# Patient Record
Sex: Female | Born: 1945
Health system: Southern US, Community
[De-identification: ages and names within clinical notes are randomized; demographics above are authoritative.]

## PROBLEM LIST (undated history)

## (undated) DIAGNOSIS — K589 Irritable bowel syndrome without diarrhea: Secondary | ICD-10-CM

## (undated) DIAGNOSIS — N63 Unspecified lump in unspecified breast: Secondary | ICD-10-CM

## (undated) DIAGNOSIS — C801 Malignant (primary) neoplasm, unspecified: Secondary | ICD-10-CM

## (undated) DIAGNOSIS — C7931 Secondary malignant neoplasm of brain: Secondary | ICD-10-CM

## (undated) DIAGNOSIS — IMO0001 Reserved for inherently not codable concepts without codable children: Secondary | ICD-10-CM

## (undated) DIAGNOSIS — IMO0002 Reserved for concepts with insufficient information to code with codable children: Secondary | ICD-10-CM

## (undated) DIAGNOSIS — M810 Age-related osteoporosis without current pathological fracture: Secondary | ICD-10-CM

## (undated) DIAGNOSIS — L0291 Cutaneous abscess, unspecified: Secondary | ICD-10-CM

## (undated) HISTORY — DX: Cutaneous abscess, unspecified: L02.91

## (undated) HISTORY — PX: BREAST LUMPECTOMY: SHX2

## (undated) HISTORY — DX: Irritable bowel syndrome, unspecified: K58.9

## (undated) HISTORY — DX: Unspecified lump in unspecified breast: N63.0

## (undated) HISTORY — DX: Malignant (primary) neoplasm, unspecified: C80.1

---

## 2010-02-17 ENCOUNTER — Encounter: Admission: RE | Admit: 2010-02-17 | Discharge: 2010-02-17 | Payer: Self-pay | Admitting: Internal Medicine

## 2010-02-18 ENCOUNTER — Ambulatory Visit: Payer: Self-pay | Admitting: Oncology

## 2010-02-25 LAB — COMPREHENSIVE METABOLIC PANEL
ALT: 20 U/L (ref 0–35)
AST: 19 U/L (ref 0–37)
Albumin: 4.3 g/dL (ref 3.5–5.2)
Alkaline Phosphatase: 77 U/L (ref 39–117)
BUN: 12 mg/dL (ref 6–23)
CO2: 25 mEq/L (ref 19–32)
Calcium: 9.3 mg/dL (ref 8.4–10.5)
Chloride: 101 mEq/L (ref 96–112)
Creatinine, Ser: 0.63 mg/dL (ref 0.40–1.20)
Glucose, Bld: 152 mg/dL — ABNORMAL HIGH (ref 70–99)
Potassium: 3.9 mEq/L (ref 3.5–5.3)
Sodium: 138 mEq/L (ref 135–145)
Total Bilirubin: 0.4 mg/dL (ref 0.3–1.2)
Total Protein: 7.6 g/dL (ref 6.0–8.3)

## 2010-02-25 LAB — CBC WITH DIFFERENTIAL/PLATELET
BASO%: 0.7 % (ref 0.0–2.0)
Basophils Absolute: 0 10*3/uL (ref 0.0–0.1)
EOS%: 4 % (ref 0.0–7.0)
Eosinophils Absolute: 0.1 10*3/uL (ref 0.0–0.5)
HCT: 38.1 % (ref 34.8–46.6)
HGB: 13 g/dL (ref 11.6–15.9)
LYMPH%: 48.5 % (ref 14.0–49.7)
MCH: 27.1 pg (ref 25.1–34.0)
MCHC: 34 g/dL (ref 31.5–36.0)
MCV: 79.7 fL (ref 79.5–101.0)
MONO#: 0.3 10*3/uL (ref 0.1–0.9)
MONO%: 8.2 % (ref 0.0–14.0)
NEUT#: 1.4 10*3/uL — ABNORMAL LOW (ref 1.5–6.5)
NEUT%: 38.6 % (ref 38.4–76.8)
Platelets: 283 10*3/uL (ref 145–400)
RBC: 4.78 10*6/uL (ref 3.70–5.45)
RDW: 12.7 % (ref 11.2–14.5)
WBC: 3.8 10*3/uL — ABNORMAL LOW (ref 3.9–10.3)
lymph#: 1.8 10*3/uL (ref 0.9–3.3)

## 2010-02-25 LAB — CANCER ANTIGEN 27.29: CA 27.29: 21 U/mL (ref 0–39)

## 2010-03-03 ENCOUNTER — Encounter: Admission: RE | Admit: 2010-03-03 | Discharge: 2010-03-03 | Payer: Self-pay | Admitting: Oncology

## 2010-03-05 ENCOUNTER — Ambulatory Visit (HOSPITAL_COMMUNITY): Admission: RE | Admit: 2010-03-05 | Discharge: 2010-03-05 | Payer: Self-pay | Admitting: Internal Medicine

## 2010-03-27 ENCOUNTER — Ambulatory Visit: Payer: Self-pay | Admitting: Oncology

## 2010-03-30 LAB — COMPREHENSIVE METABOLIC PANEL
ALT: 15 U/L (ref 0–35)
AST: 15 U/L (ref 0–37)
Albumin: 4.3 g/dL (ref 3.5–5.2)
Alkaline Phosphatase: 71 U/L (ref 39–117)
BUN: 12 mg/dL (ref 6–23)
CO2: 26 mEq/L (ref 19–32)
Calcium: 9.3 mg/dL (ref 8.4–10.5)
Chloride: 102 mEq/L (ref 96–112)
Creatinine, Ser: 0.68 mg/dL (ref 0.40–1.20)
Glucose, Bld: 110 mg/dL — ABNORMAL HIGH (ref 70–99)
Potassium: 4.4 mEq/L (ref 3.5–5.3)
Sodium: 138 mEq/L (ref 135–145)
Total Bilirubin: 0.4 mg/dL (ref 0.3–1.2)
Total Protein: 7.4 g/dL (ref 6.0–8.3)

## 2010-03-30 LAB — CBC WITH DIFFERENTIAL/PLATELET
BASO%: 0.6 % (ref 0.0–2.0)
Basophils Absolute: 0 10*3/uL (ref 0.0–0.1)
EOS%: 4 % (ref 0.0–7.0)
Eosinophils Absolute: 0.2 10*3/uL (ref 0.0–0.5)
HCT: 36.3 % (ref 34.8–46.6)
HGB: 12.6 g/dL (ref 11.6–15.9)
LYMPH%: 47.6 % (ref 14.0–49.7)
MCH: 27.2 pg (ref 25.1–34.0)
MCHC: 34.6 g/dL (ref 31.5–36.0)
MCV: 78.5 fL — ABNORMAL LOW (ref 79.5–101.0)
MONO#: 0.4 10*3/uL (ref 0.1–0.9)
MONO%: 9.1 % (ref 0.0–14.0)
NEUT#: 1.6 10*3/uL (ref 1.5–6.5)
NEUT%: 38.7 % (ref 38.4–76.8)
Platelets: 276 10*3/uL (ref 145–400)
RBC: 4.62 10*6/uL (ref 3.70–5.45)
RDW: 13.3 % (ref 11.2–14.5)
WBC: 4.2 10*3/uL (ref 3.9–10.3)
lymph#: 2 10*3/uL (ref 0.9–3.3)

## 2010-05-19 ENCOUNTER — Ambulatory Visit: Payer: Self-pay | Admitting: Oncology

## 2010-05-21 LAB — COMPREHENSIVE METABOLIC PANEL
ALT: 21 U/L (ref 0–35)
AST: 24 U/L (ref 0–37)
Albumin: 4 g/dL (ref 3.5–5.2)
Alkaline Phosphatase: 67 U/L (ref 39–117)
BUN: 11 mg/dL (ref 6–23)
CO2: 26 mEq/L (ref 19–32)
Calcium: 9.2 mg/dL (ref 8.4–10.5)
Chloride: 106 mEq/L (ref 96–112)
Creatinine, Ser: 0.73 mg/dL (ref 0.40–1.20)
Glucose, Bld: 114 mg/dL — ABNORMAL HIGH (ref 70–99)
Potassium: 4.1 mEq/L (ref 3.5–5.3)
Sodium: 140 mEq/L (ref 135–145)
Total Bilirubin: 0.6 mg/dL (ref 0.3–1.2)
Total Protein: 7.5 g/dL (ref 6.0–8.3)

## 2010-05-21 LAB — CBC WITH DIFFERENTIAL/PLATELET
BASO%: 0.9 % (ref 0.0–2.0)
Basophils Absolute: 0 10*3/uL (ref 0.0–0.1)
EOS%: 1.9 % (ref 0.0–7.0)
Eosinophils Absolute: 0.1 10*3/uL (ref 0.0–0.5)
HCT: 36.3 % (ref 34.8–46.6)
HGB: 12.5 g/dL (ref 11.6–15.9)
LYMPH%: 40.4 % (ref 14.0–49.7)
MCH: 27.3 pg (ref 25.1–34.0)
MCHC: 34.5 g/dL (ref 31.5–36.0)
MCV: 79.1 fL — ABNORMAL LOW (ref 79.5–101.0)
MONO#: 0.3 10*3/uL (ref 0.1–0.9)
MONO%: 9.7 % (ref 0.0–14.0)
NEUT#: 1.5 10*3/uL (ref 1.5–6.5)
NEUT%: 47.1 % (ref 38.4–76.8)
Platelets: 289 10*3/uL (ref 145–400)
RBC: 4.59 10*6/uL (ref 3.70–5.45)
RDW: 13.4 % (ref 11.2–14.5)
WBC: 3.1 10*3/uL — ABNORMAL LOW (ref 3.9–10.3)
lymph#: 1.3 10*3/uL (ref 0.9–3.3)

## 2010-05-21 LAB — VITAMIN D 25 HYDROXY (VIT D DEFICIENCY, FRACTURES): Vit D, 25-Hydroxy: 27 ng/mL — ABNORMAL LOW (ref 30–89)

## 2010-05-21 LAB — CANCER ANTIGEN 27.29: CA 27.29: 17 U/mL (ref 0–39)

## 2010-05-21 LAB — LACTATE DEHYDROGENASE: LDH: 135 U/L (ref 94–250)

## 2010-06-01 ENCOUNTER — Ambulatory Visit (HOSPITAL_COMMUNITY)
Admission: RE | Admit: 2010-06-01 | Discharge: 2010-06-01 | Payer: Self-pay | Source: Home / Self Care | Admitting: Oncology

## 2010-07-09 ENCOUNTER — Ambulatory Visit: Payer: Self-pay | Admitting: Oncology

## 2010-09-09 ENCOUNTER — Ambulatory Visit: Payer: Self-pay | Admitting: Oncology

## 2010-09-11 LAB — CBC WITH DIFFERENTIAL/PLATELET
BASO%: 0.7 % (ref 0.0–2.0)
Basophils Absolute: 0 10*3/uL (ref 0.0–0.1)
EOS%: 1.1 % (ref 0.0–7.0)
Eosinophils Absolute: 0 10*3/uL (ref 0.0–0.5)
HCT: 37.3 % (ref 34.8–46.6)
HGB: 12.6 g/dL (ref 11.6–15.9)
LYMPH%: 40.7 % (ref 14.0–49.7)
MCH: 27 pg (ref 25.1–34.0)
MCHC: 33.8 g/dL (ref 31.5–36.0)
MCV: 80.1 fL (ref 79.5–101.0)
MONO#: 0.3 10*3/uL (ref 0.1–0.9)
MONO%: 8.8 % (ref 0.0–14.0)
NEUT#: 1.7 10*3/uL (ref 1.5–6.5)
NEUT%: 48.7 % (ref 38.4–76.8)
Platelets: 305 10*3/uL (ref 145–400)
RBC: 4.66 10*6/uL (ref 3.70–5.45)
RDW: 13.2 % (ref 11.2–14.5)
WBC: 3.5 10*3/uL — ABNORMAL LOW (ref 3.9–10.3)
lymph#: 1.4 10*3/uL (ref 0.9–3.3)

## 2010-09-12 LAB — COMPREHENSIVE METABOLIC PANEL
ALT: 12 U/L (ref 0–35)
AST: 13 U/L (ref 0–37)
Albumin: 4.4 g/dL (ref 3.5–5.2)
Alkaline Phosphatase: 63 U/L (ref 39–117)
BUN: 12 mg/dL (ref 6–23)
CO2: 26 mEq/L (ref 19–32)
Calcium: 9.6 mg/dL (ref 8.4–10.5)
Chloride: 102 mEq/L (ref 96–112)
Creatinine, Ser: 0.73 mg/dL (ref 0.40–1.20)
Glucose, Bld: 132 mg/dL — ABNORMAL HIGH (ref 70–99)
Potassium: 3.9 mEq/L (ref 3.5–5.3)
Sodium: 139 mEq/L (ref 135–145)
Total Bilirubin: 0.3 mg/dL (ref 0.3–1.2)
Total Protein: 7.5 g/dL (ref 6.0–8.3)

## 2010-09-12 LAB — VITAMIN D 25 HYDROXY (VIT D DEFICIENCY, FRACTURES): Vit D, 25-Hydroxy: 77 ng/mL (ref 30–89)

## 2010-09-12 LAB — CANCER ANTIGEN 27.29: CA 27.29: 20 U/mL (ref 0–39)

## 2010-09-22 ENCOUNTER — Encounter: Admission: RE | Admit: 2010-09-22 | Discharge: 2010-09-22 | Payer: Self-pay | Admitting: Oncology

## 2010-11-09 LAB — COMPREHENSIVE METABOLIC PANEL
ALT: 17 U/L (ref 0–35)
AST: 18 U/L (ref 0–37)
Albumin: 4 g/dL (ref 3.5–5.2)
Alkaline Phosphatase: 64 U/L (ref 39–117)
BUN: 11 mg/dL (ref 6–23)
CO2: 28 mEq/L (ref 19–32)
Calcium: 9.5 mg/dL (ref 8.4–10.5)
Chloride: 105 mEq/L (ref 96–112)
Creatinine, Ser: 0.68 mg/dL (ref 0.4–1.2)
GFR calc Af Amer: >60 mL/min (ref >60)
GFR calc non Af Amer: >60 mL/min (ref >60)
Glucose, Bld: 103 mg/dL — ABNORMAL HIGH (ref 70–99)
Potassium: 4.2 mEq/L (ref 3.5–5.1)
Sodium: 141 mEq/L (ref 135–145)
Total Bilirubin: 0.6 mg/dL (ref 0.3–1.2)
Total Protein: 6.7 g/dL (ref 6.0–8.3)

## 2010-11-09 LAB — DIFFERENTIAL
Basophils Absolute: 0 10*3/uL (ref 0.0–0.1)
Basophils Relative: 1 % (ref 0–1)
Eosinophils Absolute: 0.1 10*3/uL (ref 0.0–0.7)
Eosinophils Relative: 2 % (ref 0–5)
Lymphocytes Relative: 47 % — ABNORMAL HIGH (ref 12–46)
Lymphs Abs: 1.9 10*3/uL (ref 0.7–4.0)
Monocytes Absolute: 0.3 10*3/uL (ref 0.1–1.0)
Monocytes Relative: 7 % (ref 3–12)
Neutro Abs: 1.8 10*3/uL (ref 1.7–7.7)
Neutrophils Relative %: 44 % (ref 43–77)

## 2010-11-09 LAB — CBC
HCT: 36.3 % (ref 36.0–46.0)
Hemoglobin: 12 g/dL (ref 12.0–15.0)
MCH: 26.3 pg (ref 26.0–34.0)
MCHC: 33.1 g/dL (ref 30.0–36.0)
MCV: 79.6 fL (ref 78.0–100.0)
Platelets: 287 10*3/uL (ref 150–400)
RBC: 4.56 MIL/uL (ref 3.87–5.11)
RDW: 13.1 % (ref 11.5–15.5)
WBC: 4.1 10*3/uL (ref 4.0–10.5)

## 2010-11-09 LAB — SURGICAL PCR SCREEN
MRSA, PCR: NEGATIVE
Staphylococcus aureus: NEGATIVE

## 2010-11-10 ENCOUNTER — Ambulatory Visit (HOSPITAL_COMMUNITY)
Admission: RE | Admit: 2010-11-10 | Discharge: 2010-11-10 | Payer: Self-pay | Source: Home / Self Care | Attending: General Surgery | Admitting: General Surgery

## 2010-11-27 NOTE — Op Note (Signed)
Michele Hale, Michele Hale           ACCOUNT NO.:  [id number]  MEDICAL RECORD NO.:  [id number]          PATIENT TYPE:  AMB  LOCATION:  SDS                          FACILITY:  [facility]  PHYSICIAN:  Ollen Gross. Vernell Morgans, M.D. DATE OF BIRTH:  [birth year]  DATE OF PROCEDURE:  11/10/2010 DATE OF DISCHARGE:  11/10/2010                              OPERATIVE REPORT   PREOPERATIVE DIAGNOSIS:  Left breast cancer.  POSTOPERATIVE DIAGNOSIS:  Left breast cancer.  PROCEDURE:  Left breast lumpectomy and sentinel node biopsy x4 with injection of blue dye.  SURGEON:  Ollen Gross. Vernell Morgans, MD  ANESTHESIA:  General via LMA.  PROCEDURE:  After informed consent was obtained, the patient was brought to the operating room, placed in a supine position on the operating room table.  After an adequate induction of general anesthesia, the patient's left breast, chest, and axillary areas were all prepped with ChloraPrep, allowed to dry, and then draped in usual sterile manner.  Earlier in the day, the patient got an injection of 1 mCi technetium sulfur colloid in the subareolar position.  At this point, 2 mL of methylene blue and 3 mL of injectable saline were also injected in the subareolar position and the breast was massaged for several minutes.  NeoProbe was used to identify hot spot in the left axilla.  A small transverse incision was made overlying the hot spot with a 15-blade knife.  This incision was carried down through the skin and subcutaneous tissue sharply with electrocautery until the axilla was entered.  Once into the axilla, blunt dissection was used onto the direction of the NeoProbe until we were able to identify actually 4 different blue lymph nodes, 3 of which were hot, the highest scale was around 2000, each of these was excised by sharp dissection with electrocautery.  Then the lymphatics to each lymph node were clamped with hemostats, divided and ligated with 3-0 Vicryl ties, I believe the  third sentinel node actually was blue, but not hot and probably had 3 small lymph nodes.  These were all sent to Pathology for further evaluation.  The area was examined and found to be hemostatic.  The deep layer of the wound was then closed with interrupted 3-0 Vicryl stitches.  The skin was then closed with a running 4-0 Monocryl subcuticular stitch.  Attention was then turned to the left breast.  The mass itself was tethering the skin at about the 5 o'clock position.  An elliptical radially-oriented incision was made overlying the tented skin to incorporate the skin.  This incision was carried down through the skin and subcutaneous tissue sharply with the electrocautery.  Once into the breast tissue, the mass could be palpated.  A circular portion of breast tissue was excised sharply around the mass until it was completely removed.  It was then oriented according to the assigned colors on the paint kit and then sent to Pathology for further evaluation.  Hemostasis was achieved using the Bovie electrocautery.  The breast tissue was then dissected away from the skin and the subcutaneous blade.  This allowed the breast tissue to cover the cavity.  The breast tissue  was sutured to the inferior edge of the cavity with interrupted 3-0 Vicryl stitches.  We then closed the skin overlying the cavity with interrupted 4-0 Monocryl subcuticular stitches.  Dermabond dressings were applied, the axillary wound was also infiltrated with 0.25% Marcaine.  The patient tolerated the procedure well.  At the end of the case, all needle, sponge, and instrument counts were correct.  The patient was then awakened and taken to the recovery room in a stable condition.     Ollen Gross. Vernell Morgans, M.D.     PST/MEDQ  D:  11/10/2010  T:  11/11/2010  Job:  [id number]  Electronically Signed by Chevis Pretty III M.D. on 11/27/2010 08:53:04 AM

## 2010-12-10 ENCOUNTER — Other Ambulatory Visit: Payer: Self-pay | Admitting: Oncology

## 2010-12-10 ENCOUNTER — Encounter (HOSPITAL_BASED_OUTPATIENT_CLINIC_OR_DEPARTMENT_OTHER): Payer: Self-pay | Admitting: Oncology

## 2010-12-10 DIAGNOSIS — Z170 Estrogen receptor positive status [ER+]: Secondary | ICD-10-CM

## 2010-12-10 DIAGNOSIS — C50519 Malignant neoplasm of lower-outer quadrant of unspecified female breast: Secondary | ICD-10-CM

## 2010-12-10 LAB — CBC WITH DIFFERENTIAL/PLATELET
BASO%: 1 % (ref 0.0–2.0)
Basophils Absolute: 0 10*3/uL (ref 0.0–0.1)
EOS%: 1.6 % (ref 0.0–7.0)
Eosinophils Absolute: 0.1 10*3/uL (ref 0.0–0.5)
HCT: 36.2 % (ref 34.8–46.6)
HGB: 12.5 g/dL (ref 11.6–15.9)
LYMPH%: 33.8 % (ref 14.0–49.7)
MCH: 27.4 pg (ref 25.1–34.0)
MCHC: 34.4 g/dL (ref 31.5–36.0)
MCV: 79.8 fL (ref 79.5–101.0)
MONO#: 0.3 10*3/uL (ref 0.1–0.9)
MONO%: 7.1 % (ref 0.0–14.0)
NEUT#: 2.1 10*3/uL (ref 1.5–6.5)
NEUT%: 56.5 % (ref 38.4–76.8)
Platelets: 271 10*3/uL (ref 145–400)
RBC: 4.54 10*6/uL (ref 3.70–5.45)
RDW: 13.1 % (ref 11.2–14.5)
WBC: 3.7 10*3/uL — ABNORMAL LOW (ref 3.9–10.3)
lymph#: 1.3 10*3/uL (ref 0.9–3.3)

## 2010-12-10 LAB — COMPREHENSIVE METABOLIC PANEL
ALT: 16 U/L (ref 0–35)
AST: 25 U/L (ref 0–37)
Albumin: 3.9 g/dL (ref 3.5–5.2)
Alkaline Phosphatase: 71 U/L (ref 39–117)
BUN: 11 mg/dL (ref 6–23)
CO2: 27 mEq/L (ref 19–32)
Calcium: 9.7 mg/dL (ref 8.4–10.5)
Chloride: 104 mEq/L (ref 96–112)
Creatinine, Ser: 0.87 mg/dL (ref 0.40–1.20)
Glucose, Bld: 144 mg/dL — ABNORMAL HIGH (ref 70–99)
Potassium: 3.9 mEq/L (ref 3.5–5.3)
Sodium: 140 mEq/L (ref 135–145)
Total Bilirubin: 0.2 mg/dL — ABNORMAL LOW (ref 0.3–1.2)
Total Protein: 7.6 g/dL (ref 6.0–8.3)

## 2010-12-11 LAB — VITAMIN D 25 HYDROXY (VIT D DEFICIENCY, FRACTURES): Vit D, 25-Hydroxy: 100 ng/mL — ABNORMAL HIGH (ref 30–89)

## 2010-12-17 ENCOUNTER — Encounter (HOSPITAL_BASED_OUTPATIENT_CLINIC_OR_DEPARTMENT_OTHER): Payer: Self-pay | Admitting: Oncology

## 2010-12-17 DIAGNOSIS — C50519 Malignant neoplasm of lower-outer quadrant of unspecified female breast: Secondary | ICD-10-CM

## 2010-12-17 DIAGNOSIS — Z170 Estrogen receptor positive status [ER+]: Secondary | ICD-10-CM

## 2011-01-01 ENCOUNTER — Ambulatory Visit: Payer: Self-pay | Attending: Radiation Oncology | Admitting: Radiation Oncology

## 2011-01-01 DIAGNOSIS — C50519 Malignant neoplasm of lower-outer quadrant of unspecified female breast: Secondary | ICD-10-CM | POA: Insufficient documentation

## 2011-01-01 DIAGNOSIS — R209 Unspecified disturbances of skin sensation: Secondary | ICD-10-CM | POA: Insufficient documentation

## 2011-01-01 DIAGNOSIS — Z510 Encounter for antineoplastic radiation therapy: Secondary | ICD-10-CM | POA: Insufficient documentation

## 2011-01-01 DIAGNOSIS — M25519 Pain in unspecified shoulder: Secondary | ICD-10-CM | POA: Insufficient documentation

## 2011-02-23 ENCOUNTER — Other Ambulatory Visit: Payer: Self-pay | Admitting: Oncology

## 2011-02-23 ENCOUNTER — Encounter (HOSPITAL_BASED_OUTPATIENT_CLINIC_OR_DEPARTMENT_OTHER): Payer: Self-pay | Admitting: Oncology

## 2011-02-23 DIAGNOSIS — Z853 Personal history of malignant neoplasm of breast: Secondary | ICD-10-CM

## 2011-02-23 DIAGNOSIS — C50519 Malignant neoplasm of lower-outer quadrant of unspecified female breast: Secondary | ICD-10-CM

## 2011-02-23 DIAGNOSIS — Z170 Estrogen receptor positive status [ER+]: Secondary | ICD-10-CM

## 2011-03-18 ENCOUNTER — Ambulatory Visit
Admission: RE | Admit: 2011-03-18 | Discharge: 2011-03-18 | Disposition: A | Discharge status: Home / Self Care | Payer: Self-pay | Source: Ambulatory Visit | Attending: Radiation Oncology | Admitting: Radiation Oncology

## 2011-05-18 ENCOUNTER — Ambulatory Visit
Admission: RE | Admit: 2011-05-18 | Discharge: 2011-05-18 | Disposition: A | Discharge status: Home / Self Care | Payer: Self-pay | Source: Ambulatory Visit | Attending: Oncology | Admitting: Oncology

## 2011-05-18 DIAGNOSIS — Z853 Personal history of malignant neoplasm of breast: Secondary | ICD-10-CM

## 2011-05-20 ENCOUNTER — Other Ambulatory Visit: Payer: Self-pay | Admitting: Oncology

## 2011-05-20 ENCOUNTER — Encounter (HOSPITAL_BASED_OUTPATIENT_CLINIC_OR_DEPARTMENT_OTHER): Payer: Self-pay | Admitting: Oncology

## 2011-05-20 DIAGNOSIS — Z170 Estrogen receptor positive status [ER+]: Secondary | ICD-10-CM

## 2011-05-20 DIAGNOSIS — C50519 Malignant neoplasm of lower-outer quadrant of unspecified female breast: Secondary | ICD-10-CM

## 2011-05-20 LAB — CBC WITH DIFFERENTIAL/PLATELET
BASO%: 0.7 % (ref 0.0–2.0)
Eosinophils Absolute: 0.1 10*3/uL (ref 0.0–0.5)
HCT: 34.3 % — ABNORMAL LOW (ref 34.8–46.6)
MCHC: 34.4 g/dL (ref 31.5–36.0)
MONO#: 0.3 10*3/uL (ref 0.1–0.9)
NEUT#: 1.5 10*3/uL (ref 1.5–6.5)
RBC: 4.24 10*6/uL (ref 3.70–5.45)
WBC: 2.6 10*3/uL — ABNORMAL LOW (ref 3.9–10.3)
lymph#: 0.8 10*3/uL — ABNORMAL LOW (ref 0.9–3.3)

## 2011-05-21 LAB — COMPREHENSIVE METABOLIC PANEL
AST: 15 U/L (ref 0–37)
Alkaline Phosphatase: 63 U/L (ref 39–117)
BUN: 13 mg/dL (ref 6–23)
Calcium: 9.4 mg/dL (ref 8.4–10.5)
Chloride: 103 mEq/L (ref 96–112)
Creatinine, Ser: 0.75 mg/dL (ref 0.50–1.10)

## 2011-05-21 LAB — CANCER ANTIGEN 27.29: CA 27.29: 17 U/mL (ref 0–39)

## 2011-05-27 ENCOUNTER — Encounter (HOSPITAL_BASED_OUTPATIENT_CLINIC_OR_DEPARTMENT_OTHER): Payer: Self-pay | Admitting: Oncology

## 2011-05-27 ENCOUNTER — Other Ambulatory Visit: Payer: Self-pay | Admitting: Oncology

## 2011-05-27 DIAGNOSIS — C50519 Malignant neoplasm of lower-outer quadrant of unspecified female breast: Secondary | ICD-10-CM

## 2011-05-27 DIAGNOSIS — R2230 Localized swelling, mass and lump, unspecified upper limb: Secondary | ICD-10-CM

## 2011-05-27 DIAGNOSIS — Z170 Estrogen receptor positive status [ER+]: Secondary | ICD-10-CM

## 2011-06-09 ENCOUNTER — Ambulatory Visit
Admission: RE | Admit: 2011-06-09 | Discharge: 2011-06-09 | Disposition: A | Discharge status: Home / Self Care | Payer: Self-pay | Source: Ambulatory Visit | Attending: Oncology | Admitting: Oncology

## 2011-06-09 DIAGNOSIS — R2230 Localized swelling, mass and lump, unspecified upper limb: Secondary | ICD-10-CM

## 2011-06-10 ENCOUNTER — Ambulatory Visit
Admission: RE | Admit: 2011-06-10 | Discharge: 2011-06-10 | Disposition: A | Discharge status: Home / Self Care | Payer: Self-pay | Source: Ambulatory Visit | Attending: Oncology | Admitting: Oncology

## 2011-06-10 ENCOUNTER — Other Ambulatory Visit: Payer: Self-pay | Admitting: Oncology

## 2011-06-10 DIAGNOSIS — M79622 Pain in left upper arm: Secondary | ICD-10-CM

## 2011-11-27 ENCOUNTER — Telehealth: Payer: Self-pay | Admitting: Oncology

## 2011-11-27 NOTE — Telephone Encounter (Signed)
lmonvm for pt re appts for march. Schedule mailed.

## 2011-12-29 ENCOUNTER — Telehealth: Payer: Self-pay | Admitting: Oncology

## 2011-12-29 NOTE — Telephone Encounter (Signed)
Pt's son called to r/s he pt's march appts. gve him the next avail date

## 2011-12-30 ENCOUNTER — Other Ambulatory Visit: Payer: Self-pay | Admitting: Lab

## 2012-01-06 ENCOUNTER — Ambulatory Visit: Payer: Self-pay | Admitting: Oncology

## 2012-01-10 ENCOUNTER — Other Ambulatory Visit: Payer: Self-pay | Admitting: *Deleted

## 2012-01-10 DIAGNOSIS — Z170 Estrogen receptor positive status [ER+]: Secondary | ICD-10-CM

## 2012-01-10 DIAGNOSIS — R110 Nausea: Secondary | ICD-10-CM

## 2012-01-10 DIAGNOSIS — C50519 Malignant neoplasm of lower-outer quadrant of unspecified female breast: Secondary | ICD-10-CM

## 2012-01-10 MED ORDER — LETROZOLE 2.5 MG PO TABS: 2.5000 mg | ORAL_TABLET | Freq: Every day | ORAL | Status: DC

## 2012-01-10 MED ORDER — PROMETHAZINE HCL 25 MG PO TABS: 25.0000 mg | ORAL_TABLET | Freq: Four times a day (QID) | ORAL | Status: DC | PRN

## 2012-01-10 NOTE — Telephone Encounter (Signed)
Son called asking for an earlier appt. Date.  Asked why and he says she needs refills on femara and phenergan.  Takes phenergan before meals.  No need to change appts.  Refills done at this time.

## 2012-01-11 ENCOUNTER — Encounter: Payer: Self-pay | Admitting: Oncology

## 2012-01-11 ENCOUNTER — Other Ambulatory Visit: Payer: Self-pay | Admitting: Nurse Practitioner

## 2012-01-11 DIAGNOSIS — R110 Nausea: Secondary | ICD-10-CM

## 2012-01-11 DIAGNOSIS — Z170 Estrogen receptor positive status [ER+]: Secondary | ICD-10-CM

## 2012-01-11 DIAGNOSIS — C50519 Malignant neoplasm of lower-outer quadrant of unspecified female breast: Secondary | ICD-10-CM

## 2012-01-11 MED ORDER — LETROZOLE 2.5 MG PO TABS: 2.5000 mg | ORAL_TABLET | Freq: Every day | ORAL | Status: AC

## 2012-01-11 NOTE — Progress Notes (Signed)
Patient received two prescription from [facility] pharmacy on 01/10/12 $40.83,her remaninig balance [facility] $76.02 and ALIGHT $325.32.

## 2012-01-18 ENCOUNTER — Telehealth: Payer: Self-pay | Admitting: *Deleted

## 2012-01-18 NOTE — Telephone Encounter (Signed)
left message to inform the patient of the new date and time of the new appointment 02-07-2012 starting at 1:00pm

## 2012-01-19 ENCOUNTER — Telehealth: Payer: Self-pay | Admitting: *Deleted

## 2012-01-19 NOTE — Telephone Encounter (Signed)
na

## 2012-01-24 ENCOUNTER — Encounter (INDEPENDENT_AMBULATORY_CARE_PROVIDER_SITE_OTHER): Payer: Self-pay | Admitting: General Surgery

## 2012-01-24 ENCOUNTER — Ambulatory Visit (INDEPENDENT_AMBULATORY_CARE_PROVIDER_SITE_OTHER): Payer: Self-pay | Admitting: General Surgery

## 2012-01-24 ENCOUNTER — Other Ambulatory Visit: Payer: Self-pay | Admitting: *Deleted

## 2012-01-24 VITALS — BP 153/87 | HR 93 | Temp 97.9°F | Ht 62.0 in | Wt 154.2 lb

## 2012-01-24 DIAGNOSIS — C50919 Malignant neoplasm of unspecified site of unspecified female breast: Secondary | ICD-10-CM | POA: Insufficient documentation

## 2012-01-24 MED ORDER — ALENDRONATE SODIUM 70 MG PO TABS: 70.0000 mg | ORAL_TABLET | ORAL | Status: DC

## 2012-01-24 NOTE — Patient Instructions (Signed)
Continue Femara Continue regular self exams

## 2012-01-25 ENCOUNTER — Encounter: Payer: Self-pay | Admitting: Oncology

## 2012-01-25 NOTE — Progress Notes (Signed)
Patient received one prescription from [facility] pharmacy on 01/24/12 $26.09,her remaninig balance [facility] $49.93 and ALIGHT $268.42.

## 2012-02-03 ENCOUNTER — Other Ambulatory Visit: Payer: Self-pay | Admitting: Lab

## 2012-02-07 ENCOUNTER — Other Ambulatory Visit (HOSPITAL_BASED_OUTPATIENT_CLINIC_OR_DEPARTMENT_OTHER): Payer: Self-pay | Admitting: Lab

## 2012-02-07 ENCOUNTER — Telehealth: Payer: Self-pay | Admitting: Oncology

## 2012-02-07 ENCOUNTER — Ambulatory Visit (HOSPITAL_BASED_OUTPATIENT_CLINIC_OR_DEPARTMENT_OTHER): Payer: Self-pay | Admitting: Oncology

## 2012-02-07 VITALS — BP 121/76 | HR 96 | Temp 98.8°F | Ht 62.0 in | Wt 151.1 lb

## 2012-02-07 DIAGNOSIS — C50519 Malignant neoplasm of lower-outer quadrant of unspecified female breast: Secondary | ICD-10-CM

## 2012-02-07 DIAGNOSIS — Z170 Estrogen receptor positive status [ER+]: Secondary | ICD-10-CM

## 2012-02-07 DIAGNOSIS — M810 Age-related osteoporosis without current pathological fracture: Secondary | ICD-10-CM

## 2012-02-07 DIAGNOSIS — C50919 Malignant neoplasm of unspecified site of unspecified female breast: Secondary | ICD-10-CM

## 2012-02-07 LAB — CBC WITH DIFFERENTIAL/PLATELET
BASO%: 1.7 % (ref 0.0–2.0)
Basophils Absolute: 0.1 10*3/uL (ref 0.0–0.1)
EOS%: 11.2 % — ABNORMAL HIGH (ref 0.0–7.0)
HCT: 39.2 % (ref 34.8–46.6)
HGB: 13.1 g/dL (ref 11.6–15.9)
LYMPH%: 35.8 % (ref 14.0–49.7)
MCH: 27.1 pg (ref 25.1–34.0)
MCHC: 33.4 g/dL (ref 31.5–36.0)
MONO#: 0.3 10*3/uL (ref 0.1–0.9)
NEUT%: 42.8 % (ref 38.4–76.8)
Platelets: 269 10*3/uL (ref 145–400)

## 2012-02-07 NOTE — Progress Notes (Signed)
Hematology and Oncology Follow Up Visit  Michele Hale [id number] 1946-01-18 66 y.o. 02/07/2012 2:43 PM PCP  Principle Diagnosis: 66 yo su[place] woman with hx of locally advanced er/pr+ breast cancer on femara since jan 2011, s/o lumpectomy and sentinel node 1/12, s/p xrt 4/12, n ongoing femara Hx of osteoperosis on fosamax  Interim History:  There have been no intercurrent illness, hospitalizations or medication changes. She has been in [place] and is now back, no intercurrent problems  Medications: I have reviewed the patient's current medications.  Allergies: No Known Allergies  Past Medical History, Surgical history, Social history, and Family History were reviewed and updated.  Review of Systems: Constitutional:  Negative for fever, chills, night sweats, anorexia, weight loss, pain. Cardiovascular: no chest pain or dyspnea on exertion Respiratory: no cough, shortness of breath, or wheezing Neurological: negative Dermatological: negative ENT: negative Skin Gastrointestinal: negative Genito-Urinary: negative Hematological and Lymphatic: negative Breast: negative Musculoskeletal: negative Remaining ROS negative.  Physical Exam: Blood pressure 121/76, pulse 96, temperature 98.8 F (37.1 C), temperature source Oral, height 5\' 2"  (1.575 m), weight 151 lb 1.6 oz (68.539 kg). ECOG: 0 General appearance: alert, cooperative and appears stated age Head: Normocephalic, without obvious abnormality, atraumatic Neck: no adenopathy, no carotid bruit, no JVD, supple, symmetrical, trachea midline and thyroid not enlarged, symmetric, no tenderness/mass/nodules Lymph nodes: Cervical, supraclavicular, and axillary nodes normal. Cardiac : regular rate and rhythm, no murmurs or gallops Pulmonary:clear to auscultation bilaterally and normal percussion bilaterally Breasts: inspection negative, no nipple discharge or bleeding, no masses or nodularity palpable Abdomen:soft, non-tender; bowel  sounds normal; no masses,  no organomegaly Extremities negative Neuro: alert, oriented, normal speech, no focal findings or movement disorder noted  Lab Results: Lab Results  Component Value Date   WBC 3.6* 02/07/2012   HGB 13.1 02/07/2012   HCT 39.2 02/07/2012   MCV 81.2 02/07/2012   PLT 269 02/07/2012     Chemistry      Component Value Date/Time   NA 140 05/20/2011 1357   NA 140 05/20/2011 1357   NA 140 05/20/2011 1357   K 3.9 05/20/2011 1357   K 3.9 05/20/2011 1357   K 3.9 05/20/2011 1357   CL 103 05/20/2011 1357   CL 103 05/20/2011 1357   CL 103 05/20/2011 1357   CO2 24 05/20/2011 1357   CO2 24 05/20/2011 1357   CO2 24 05/20/2011 1357   BUN 13 05/20/2011 1357   BUN 13 05/20/2011 1357   BUN 13 05/20/2011 1357   CREATININE 0.75 05/20/2011 1357   CREATININE 0.75 05/20/2011 1357   CREATININE 0.75 05/20/2011 1357      Component Value Date/Time   CALCIUM 9.4 05/20/2011 1357   CALCIUM 9.4 05/20/2011 1357   CALCIUM 9.4 05/20/2011 1357   ALKPHOS 63 05/20/2011 1357   ALKPHOS 63 05/20/2011 1357   ALKPHOS 63 05/20/2011 1357   AST 15 05/20/2011 1357   AST 15 05/20/2011 1357   AST 15 05/20/2011 1357   ALT 11 05/20/2011 1357   ALT 11 05/20/2011 1357   ALT 11 05/20/2011 1357   BILITOT 0.5 05/20/2011 1357   BILITOT 0.5 05/20/2011 1357   BILITOT 0.5 05/20/2011 1357      .pathology. Radiological Studies: chest X-ray n/a Mammogram Due 7/13 Bone density Due 5/13  Impression and Plan: Patient is doing well, we will see he rin 6 months. If no improvement in bone density, need to consider prolia.  More than 50% of the visit was spent in patient-related counselling  Pierce Crane, MD 4/15/20132:43 PM

## 2012-02-07 NOTE — Telephone Encounter (Signed)
gve the pt's son the oct 2013 appt calendar along with the mammo/bone density appt

## 2012-02-08 ENCOUNTER — Ambulatory Visit: Payer: Self-pay | Admitting: Oncology

## 2012-02-08 LAB — COMPREHENSIVE METABOLIC PANEL
AST: 17 U/L (ref 0–37)
Albumin: 4.3 g/dL (ref 3.5–5.2)
BUN: 13 mg/dL (ref 6–23)
Calcium: 9.8 mg/dL (ref 8.4–10.5)
Chloride: 103 mEq/L (ref 96–112)
Creatinine, Ser: 0.73 mg/dL (ref 0.50–1.10)
Glucose, Bld: 117 mg/dL — ABNORMAL HIGH (ref 70–99)
Potassium: 4.2 mEq/L (ref 3.5–5.3)

## 2012-02-10 ENCOUNTER — Ambulatory Visit: Payer: Self-pay | Admitting: Oncology

## 2012-02-14 NOTE — Progress Notes (Signed)
Subjective:     Patient ID: Michele Hale, female   DOB: 1946/06/07, 66 y.o.   MRN: [id number]  HPI The patient is a 66 year old black female who is about a year and 3 months out from a left breast lumpectomy and negative sentinel node biopsy for a T1 C. N0 left breast cancer. She was ER-positive, weekly PR positive, and HER-2 negative. She finished radiation therapy and is now taking Femara. She seems to be tolerating this well. Since her last visit she return to [place] for several months but is back now. She denies any breast pain.  Review of Systems  Constitutional: Negative.   HENT: Negative.   Eyes: Negative.   Respiratory: Negative.   Cardiovascular: Negative.   Gastrointestinal: Negative.   Genitourinary: Negative.   Musculoskeletal: Negative.   Skin: Negative.   Neurological: Negative.   Hematological: Negative.   Psychiatric/Behavioral: Negative.        Objective:   Physical Exam  Constitutional: She is oriented to person, place, and time. She appears well-developed and well-nourished.  HENT:  Head: Normocephalic and atraumatic.  Eyes: Conjunctivae and EOM are normal. Pupils are equal, round, and reactive to light.  Neck: Normal range of motion. Neck supple.  Cardiovascular: Normal rate, regular rhythm and normal heart sounds.   Pulmonary/Chest: Effort normal and breath sounds normal.       She has some palpable fullness beneath her old incision but otherwise no palpable mass in either breast. No axillary supraclavicular or cervical lymphadenopathy. She did have some fullness in the left axilla that was biopsied and came back as fat necrosis.  Abdominal: Soft. Bowel sounds are normal. She exhibits no mass. There is no tenderness.  Musculoskeletal: Normal range of motion.  Neurological: She is alert and oriented to person, place, and time.  Skin: Skin is warm and dry.  Psychiatric: She has a normal mood and affect. Her behavior is normal.       Assessment:     One  year and 3 months status post left breast lumpectomy and negative sentinel node biopsy    Plan:     At this point she will continue to take Femara. She will continue to do regular self exams. We will plan to see her back in about 6 months.

## 2012-02-16 ENCOUNTER — Encounter: Payer: Self-pay | Admitting: Oncology

## 2012-02-16 NOTE — Progress Notes (Signed)
Patient received three prescriptions from [facility] pharmacy on 02/14/12 $66.93,her remaninig balance [facility] $350.07 and ALIGHT $201.49.

## 2012-03-15 ENCOUNTER — Encounter: Payer: Self-pay | Admitting: Oncology

## 2012-03-15 ENCOUNTER — Other Ambulatory Visit: Payer: Self-pay | Admitting: *Deleted

## 2012-03-15 DIAGNOSIS — C50919 Malignant neoplasm of unspecified site of unspecified female breast: Secondary | ICD-10-CM

## 2012-03-15 MED ORDER — PROMETHAZINE HCL 25 MG PO TABS: 25.0000 mg | ORAL_TABLET | Freq: Four times a day (QID) | ORAL | Status: DC | PRN

## 2012-03-15 NOTE — Progress Notes (Signed)
Patient received two prescriptions from [facility] pharmacy on 03/13/12 $54.54,her remaninig balance [facility]-0- and ALIGHT $196.88.

## 2012-03-22 ENCOUNTER — Encounter: Payer: Self-pay | Admitting: Oncology

## 2012-03-22 NOTE — Progress Notes (Signed)
Patient received one prescription from [facility] pharmacy on 03/15/12 $12.39,her remaninig balance [facility]-0- and ALIGHT $184.49.

## 2012-04-13 ENCOUNTER — Other Ambulatory Visit: Payer: Self-pay | Admitting: *Deleted

## 2012-04-13 DIAGNOSIS — C50919 Malignant neoplasm of unspecified site of unspecified female breast: Secondary | ICD-10-CM

## 2012-04-13 MED ORDER — PROMETHAZINE HCL 25 MG PO TABS: 25.0000 mg | ORAL_TABLET | Freq: Four times a day (QID) | ORAL | Status: DC | PRN

## 2012-04-13 NOTE — Telephone Encounter (Signed)
Called patient as instructed by provider to clarify need for phenergan refill.  Spoke with patient's son Michele Hale who reports "she has stomach problems and unable to eat without the promethazine.  She doesn't vomit every time she eats.  She needs this or she won't eat."  Reports this being a problem for twenty years.  Denies PCP.  Encouraged to obtain PCP to have this evaluated.  Verbal order received and read back from Dr. Donnie Coffin to proceed with refill.

## 2012-04-24 ENCOUNTER — Ambulatory Visit (INDEPENDENT_AMBULATORY_CARE_PROVIDER_SITE_OTHER): Payer: Self-pay | Admitting: General Surgery

## 2012-05-11 ENCOUNTER — Ambulatory Visit (INDEPENDENT_AMBULATORY_CARE_PROVIDER_SITE_OTHER): Payer: Self-pay | Admitting: General Surgery

## 2012-05-11 ENCOUNTER — Encounter (INDEPENDENT_AMBULATORY_CARE_PROVIDER_SITE_OTHER): Payer: Self-pay | Admitting: General Surgery

## 2012-05-11 ENCOUNTER — Other Ambulatory Visit: Payer: Self-pay | Admitting: *Deleted

## 2012-05-11 VITALS — BP 136/79 | HR 75 | Temp 98.1°F | Ht 62.0 in | Wt 145.0 lb

## 2012-05-11 DIAGNOSIS — C50919 Malignant neoplasm of unspecified site of unspecified female breast: Secondary | ICD-10-CM

## 2012-05-11 DIAGNOSIS — C50519 Malignant neoplasm of lower-outer quadrant of unspecified female breast: Secondary | ICD-10-CM

## 2012-05-11 MED ORDER — LETROZOLE 2.5 MG PO TABS: 2.5000 mg | ORAL_TABLET | Freq: Every day | ORAL | Status: AC

## 2012-05-11 NOTE — Patient Instructions (Signed)
Continue regular self exams Continue femara 

## 2012-05-11 NOTE — Progress Notes (Signed)
Subjective:     Patient ID: Michele Hale, female   DOB: 1946-03-27, 66 y.o.   MRN: [id number]  HPI The patient is a 66 year old black female who is one year in 6 months out from a left breast lobectomy and negative sentinel node biopsy for a T1 C. N0 left breast cancer. She has some occasional soreness of the left breast but this is stable. She continues to take Femara and is tolerating it well. She has had no problems since her last visit. She has no complaints today.  Review of Systems  Constitutional: Negative.   HENT: Negative.   Eyes: Negative.   Respiratory: Negative.   Cardiovascular: Negative.   Gastrointestinal: Negative.   Genitourinary: Negative.   Musculoskeletal: Negative.   Skin: Negative.   Neurological: Negative.   Hematological: Negative.   Psychiatric/Behavioral: Negative.        Objective:   Physical Exam  Constitutional: She is oriented to person, place, and time. She appears well-developed and well-nourished.  HENT:  Head: Normocephalic and atraumatic.  Eyes: Conjunctivae and EOM are normal. Pupils are equal, round, and reactive to light.  Neck: Normal range of motion. Neck supple.  Cardiovascular: Normal rate, regular rhythm and normal heart sounds.   Pulmonary/Chest: Effort normal and breath sounds normal.       She has some palpable fullness beneath the scar in her left breast secondary to radiation. This is stable. No other palpable mass in either breast. No palpable axillary supraclavicular cervical lymphadenopathy.  Abdominal: Soft. Bowel sounds are normal. She exhibits no mass. There is no tenderness.  Musculoskeletal: Normal range of motion.  Lymphadenopathy:    She has no cervical adenopathy.  Neurological: She is alert and oriented to person, place, and time.  Skin: Skin is warm and dry.  Psychiatric: She has a normal mood and affect. Her behavior is normal.       Assessment:     One year in 6 months status post left breast lobectomy and  negative sentinel node biopsy    Plan:     At this point she will continue to do regular self exams. She will continue to take Femara. We will plan to see her back in 3 months. She is scheduled for her next mammogram at the end of this month

## 2012-05-22 ENCOUNTER — Ambulatory Visit
Admission: RE | Admit: 2012-05-22 | Discharge: 2012-05-22 | Disposition: A | Discharge status: Home / Self Care | Payer: Self-pay | Source: Ambulatory Visit | Attending: Oncology | Admitting: Oncology

## 2012-05-22 DIAGNOSIS — C50919 Malignant neoplasm of unspecified site of unspecified female breast: Secondary | ICD-10-CM

## 2012-08-07 ENCOUNTER — Other Ambulatory Visit: Payer: Self-pay | Admitting: *Deleted

## 2012-08-07 DIAGNOSIS — C50919 Malignant neoplasm of unspecified site of unspecified female breast: Secondary | ICD-10-CM

## 2012-08-08 ENCOUNTER — Ambulatory Visit (HOSPITAL_BASED_OUTPATIENT_CLINIC_OR_DEPARTMENT_OTHER): Payer: Self-pay | Admitting: Oncology

## 2012-08-08 ENCOUNTER — Other Ambulatory Visit (HOSPITAL_BASED_OUTPATIENT_CLINIC_OR_DEPARTMENT_OTHER): Payer: Self-pay

## 2012-08-08 ENCOUNTER — Telehealth: Payer: Self-pay | Admitting: *Deleted

## 2012-08-08 VITALS — BP 144/74 | HR 71 | Temp 98.3°F | Resp 20 | Ht 62.0 in | Wt 142.0 lb

## 2012-08-08 DIAGNOSIS — C50919 Malignant neoplasm of unspecified site of unspecified female breast: Secondary | ICD-10-CM

## 2012-08-08 DIAGNOSIS — Z170 Estrogen receptor positive status [ER+]: Secondary | ICD-10-CM

## 2012-08-08 DIAGNOSIS — M810 Age-related osteoporosis without current pathological fracture: Secondary | ICD-10-CM

## 2012-08-08 DIAGNOSIS — C50519 Malignant neoplasm of lower-outer quadrant of unspecified female breast: Secondary | ICD-10-CM

## 2012-08-08 LAB — COMPREHENSIVE METABOLIC PANEL (CC13)
Albumin: 3.8 g/dL (ref 3.5–5.0)
Alkaline Phosphatase: 67 U/L (ref 40–150)
BUN: 13 mg/dL (ref 7.0–26.0)
CO2: 24 mEq/L (ref 22–29)
Calcium: 9.4 mg/dL (ref 8.4–10.4)
Chloride: 109 mEq/L — ABNORMAL HIGH (ref 98–107)
Glucose: 108 mg/dl — ABNORMAL HIGH (ref 70–99)
Potassium: 3.7 mEq/L (ref 3.5–5.1)
Sodium: 142 mEq/L (ref 136–145)
Total Protein: 7 g/dL (ref 6.4–8.3)

## 2012-08-08 LAB — CBC WITH DIFFERENTIAL/PLATELET
Basophils Absolute: 0 10*3/uL (ref 0.0–0.1)
Eosinophils Absolute: 0.1 10*3/uL (ref 0.0–0.5)
HGB: 11.9 g/dL (ref 11.6–15.9)
MCV: 81.4 fL (ref 79.5–101.0)
MONO#: 0.3 10*3/uL (ref 0.1–0.9)
MONO%: 10.1 % (ref 0.0–14.0)
NEUT#: 1.4 10*3/uL — ABNORMAL LOW (ref 1.5–6.5)
RBC: 4.3 10*6/uL (ref 3.70–5.45)
RDW: 13.4 % (ref 11.2–14.5)
WBC: 2.9 10*3/uL — ABNORMAL LOW (ref 3.9–10.3)
lymph#: 1.1 10*3/uL (ref 0.9–3.3)

## 2012-08-08 NOTE — Progress Notes (Signed)
Hematology and Oncology Follow Up Visit  Michele Hale [id number] [birth year] 66 y.o. 08/08/2012 4:28 PM PCP  Principle Diagnosis: 66 yo su[place]ese woman with hx of locally advanced er/pr+ breast cancer on femara since jan 2011, s/o lumpectomy and sentinel node 1/12, s/p xrt 4/12, n ongoing femara Hx of osteoperosis on fosamax  Interim History:  There have been no intercurrent illness, hospitalizations or medication changes. She has been in [place] and is now back, no intercurrent problems  Medications: I have reviewed the patient's current medications.  Allergies: No Known Allergies  Past Medical History, Surgical history, Social history, and Family History were reviewed and updated.  Review of Systems: Constitutional:  Negative for fever, chills, night sweats, anorexia, weight loss, pain. Cardiovascular: no chest pain or dyspnea on exertion Respiratory: no cough, shortness of breath, or wheezing Neurological: negative Dermatological: negative ENT: negative Skin Gastrointestinal: negative Genito-Urinary: negative Hematological and Lymphatic: negative Breast: negative Musculoskeletal: negative Remaining ROS negative.  Physical Exam: Blood pressure 144/74, pulse 71, temperature 98.3 F (36.8 C), resp. rate 20, height 5\' 2"  (1.575 m), weight 142 lb (64.411 kg). ECOG: 0 General appearance: alert, cooperative and appears stated age Head: Normocephalic, without obvious abnormality, atraumatic Neck: no adenopathy, no carotid bruit, no JVD, supple, symmetrical, trachea midline and thyroid not enlarged, symmetric, no tenderness/mass/nodules Lymph nodes: Cervical, supraclavicular, and axillary nodes normal. Cardiac : regular rate and rhythm, no murmurs or gallops Pulmonary:clear to auscultation bilaterally and normal percussion bilaterally Breasts: inspection negative, no nipple discharge or bleeding, no masses or nodularity palpable Abdomen:soft, non-tender; bowel sounds normal; no  masses,  no organomegaly Extremities negative Neuro: alert, oriented, normal speech, no focal findings or movement disorder noted  Lab Results: Lab Results  Component Value Date   WBC 2.9* 08/08/2012   HGB 11.9 08/08/2012   HCT 35.0 08/08/2012   MCV 81.4 08/08/2012   PLT 253 08/08/2012     Chemistry      Component Value Date/Time   NA 142 08/08/2012 1516   NA 139 02/07/2012 1312   K 3.7 08/08/2012 1516   K 4.2 02/07/2012 1312   CL 109* 08/08/2012 1516   CL 103 02/07/2012 1312   CO2 24 08/08/2012 1516   CO2 26 02/07/2012 1312   BUN 13.0 08/08/2012 1516   BUN 13 02/07/2012 1312   CREATININE 0.8 08/08/2012 1516   CREATININE 0.73 02/07/2012 1312      Component Value Date/Time   CALCIUM 9.4 08/08/2012 1516   CALCIUM 9.8 02/07/2012 1312   ALKPHOS 67 08/08/2012 1516   ALKPHOS 63 02/07/2012 1312   AST 16 08/08/2012 1516   AST 17 02/07/2012 1312   ALT 14 08/08/2012 1516   ALT 15 02/07/2012 1312   BILITOT 0.30 08/08/2012 1516   BILITOT 0.3 02/07/2012 1312      .pathology. Radiological Studies: chest X-ray n/a Mammogram Due 7/13 Bone density Due 5/13  Impression and Plan: Patient is doing well, we will see he rin 6 months. If no improvement in bone density, need to consider prolia.  More than 50% of the visit was spent in patient-related counselling   Pierce Crane, MD 10/15/20134:28 PM

## 2012-08-08 NOTE — Progress Notes (Signed)
Hematology and Oncology Follow Up Visit  Michele Hale [id number] Apr 14, 1946 66 y.o. 08/08/2012 4:20 PM PCP  Principle Diagnosis: 66 yo su[place] woman with hx of locally advanced er/pr+ breast cancer on femara since jan 2011, s/o lumpectomy and sentinel node 1/12, s/p xrt 4/12, n ongoing femara Hx of osteoperosis on fosamax  Interim History:  There have been no intercurrent illness, hospitalizations or medication changes. She has been in [place] and is now back, no intercurrent problems  Medications: I have reviewed the patient's current medications.  Allergies: No Known Allergies  Past Medical History, Surgical history, Social history, and Family History were reviewed and updated.  Review of Systems: Constitutional:  Negative for fever, chills, night sweats, anorexia, weight loss, pain. Cardiovascular: no chest pain or dyspnea on exertion Respiratory: no cough, shortness of breath, or wheezing Neurological: negative Dermatological: negative ENT: negative Skin Gastrointestinal: negative Genito-Urinary: negative Hematological and Lymphatic: negative Breast: negative Musculoskeletal: negative Remaining ROS negative.  Physical Exam: Blood pressure 144/74, pulse 71, temperature 98.3 F (36.8 C), resp. rate 20, height 5\' 2"  (1.575 m), weight 142 lb (64.411 kg). ECOG: 0 General appearance: alert, cooperative and appears stated age Head: Normocephalic, without obvious abnormality, atraumatic Neck: no adenopathy, no carotid bruit, no JVD, supple, symmetrical, trachea midline and thyroid not enlarged, symmetric, no tenderness/mass/nodules Lymph nodes: Cervical, supraclavicular, and axillary nodes normal. Cardiac : regular rate and rhythm, no murmurs or gallops Pulmonary:clear to auscultation bilaterally and normal percussion bilaterally Breasts: inspection negative, no nipple discharge or bleeding, no masses or nodularity palpable Abdomen:soft, non-tender; bowel sounds normal; no  masses,  no organomegaly Extremities negative Neuro: alert, oriented, normal speech, no focal findings or movement disorder noted  Lab Results: Lab Results  Component Value Date   WBC 2.9* 08/08/2012   HGB 11.9 08/08/2012   HCT 35.0 08/08/2012   MCV 81.4 08/08/2012   PLT 253 08/08/2012     Chemistry      Component Value Date/Time   NA 142 08/08/2012 1516   NA 139 02/07/2012 1312   K 3.7 08/08/2012 1516   K 4.2 02/07/2012 1312   CL 109* 08/08/2012 1516   CL 103 02/07/2012 1312   CO2 24 08/08/2012 1516   CO2 26 02/07/2012 1312   BUN 13.0 08/08/2012 1516   BUN 13 02/07/2012 1312   CREATININE 0.8 08/08/2012 1516   CREATININE 0.73 02/07/2012 1312      Component Value Date/Time   CALCIUM 9.4 08/08/2012 1516   CALCIUM 9.8 02/07/2012 1312   ALKPHOS 67 08/08/2012 1516   ALKPHOS 63 02/07/2012 1312   AST 16 08/08/2012 1516   AST 17 02/07/2012 1312   ALT 14 08/08/2012 1516   ALT 15 02/07/2012 1312   BILITOT 0.30 08/08/2012 1516   BILITOT 0.3 02/07/2012 1312      .pathology. Radiological Studies: chest X-ray n/a Mammogram Due 7/13 Bone density 7/13- osteoperosis  Impression and Plan: Patient is doing well, we will see he rin 6 months. If no improvement in bone density, need to consider prolia.I will get precertification for this.  More than 50% of the visit was spent in patient-related counselling   Pierce Crane, MD 10/15/20134:20 PM

## 2012-08-08 NOTE — Telephone Encounter (Signed)
02-07-2013 STARTING AT 3:30PM

## 2012-08-09 LAB — CANCER ANTIGEN 27.29: CA 27.29: 13 U/mL (ref 0–39)

## 2012-08-09 LAB — VITAMIN D 25 HYDROXY (VIT D DEFICIENCY, FRACTURES): Vit D, 25-Hydroxy: 44 ng/mL (ref 30–89)

## 2012-09-07 ENCOUNTER — Other Ambulatory Visit: Payer: Self-pay | Admitting: Oncology

## 2012-09-07 ENCOUNTER — Other Ambulatory Visit: Payer: Self-pay | Admitting: *Deleted

## 2012-09-07 DIAGNOSIS — C50919 Malignant neoplasm of unspecified site of unspecified female breast: Secondary | ICD-10-CM

## 2012-09-07 MED ORDER — ALENDRONATE SODIUM 70 MG PO TABS: 70.0000 mg | ORAL_TABLET | ORAL | Status: DC

## 2012-09-07 MED ORDER — PROMETHAZINE HCL 25 MG PO TABS: 25.0000 mg | ORAL_TABLET | Freq: Four times a day (QID) | ORAL | Status: DC | PRN

## 2012-09-08 ENCOUNTER — Other Ambulatory Visit: Payer: Self-pay | Admitting: *Deleted

## 2012-09-08 ENCOUNTER — Encounter: Payer: Self-pay | Admitting: Oncology

## 2012-09-08 DIAGNOSIS — C50919 Malignant neoplasm of unspecified site of unspecified female breast: Secondary | ICD-10-CM

## 2012-09-08 MED ORDER — ALENDRONATE SODIUM 70 MG PO TABS: 70.0000 mg | ORAL_TABLET | ORAL | Status: DC

## 2012-09-08 MED ORDER — LETROZOLE 2.5 MG PO TABS: 2.5000 mg | ORAL_TABLET | Freq: Every day | ORAL | Status: DC

## 2012-09-08 NOTE — Progress Notes (Signed)
Patient's son called about assistance with Femara and Fosamax, explained the only assistance I could find is through [employer] and he would have to pay $30 for the femara and $25 for the fosamax, but they are 90 day supplies.  He will come 09/11/12 to pick up the form and prescriptions.

## 2012-09-22 ENCOUNTER — Encounter: Payer: Self-pay | Admitting: Oncology

## 2012-09-22 NOTE — Progress Notes (Signed)
Checked with son Mr. Mohamed and he said his mom had no insurance. [employer], denied request for Prolia, saying she has insurance that would pay for it?? They are closed today, so we will call them on Monday to see why denied.

## 2012-09-25 ENCOUNTER — Encounter: Payer: Self-pay | Admitting: Oncology

## 2012-09-25 NOTE — Progress Notes (Signed)
I called son to see if mom had applied for medicare. He said she did 2 years ago, and was denied because she is not a citizen. She has to wait 2 more years to apply again(that is when she will be a legal citizen). [employer] had denied her because of insurance. They must have thought she had medicare(her age).

## 2012-09-26 ENCOUNTER — Telehealth: Payer: Self-pay | Admitting: *Deleted

## 2012-09-26 NOTE — Telephone Encounter (Signed)
Mailed out calendar to inform the patient of the new date and time in 01-2013 

## 2012-10-11 ENCOUNTER — Other Ambulatory Visit: Payer: Self-pay | Admitting: *Deleted

## 2012-10-11 ENCOUNTER — Telehealth: Payer: Self-pay | Admitting: *Deleted

## 2012-10-11 NOTE — Telephone Encounter (Signed)
needs appt. for prolia injection and then q every 6 months/ call son with appt. [phone]  Patient's son confirmed over the phone the new date and time of the injection

## 2012-10-12 ENCOUNTER — Other Ambulatory Visit: Payer: Self-pay | Admitting: *Deleted

## 2012-10-12 DIAGNOSIS — M810 Age-related osteoporosis without current pathological fracture: Secondary | ICD-10-CM

## 2012-10-12 DIAGNOSIS — C50919 Malignant neoplasm of unspecified site of unspecified female breast: Secondary | ICD-10-CM

## 2012-10-13 ENCOUNTER — Ambulatory Visit (HOSPITAL_BASED_OUTPATIENT_CLINIC_OR_DEPARTMENT_OTHER): Payer: Self-pay

## 2012-10-13 VITALS — BP 122/74 | HR 77 | Temp 98.2°F

## 2012-10-13 DIAGNOSIS — C50919 Malignant neoplasm of unspecified site of unspecified female breast: Secondary | ICD-10-CM

## 2012-10-13 DIAGNOSIS — M810 Age-related osteoporosis without current pathological fracture: Secondary | ICD-10-CM

## 2012-10-13 MED ORDER — DENOSUMAB 60 MG/ML ~~LOC~~ SOLN
60.0000 mg | Freq: Once | SUBCUTANEOUS | Status: AC
  Administered 2012-10-13: 60 mg via SUBCUTANEOUS
  Filled 2012-10-13: qty 1

## 2012-10-13 NOTE — Progress Notes (Signed)
Patient and her son here for her to receive 1st injection of Prolia.  Her son is her interepeter.  Talked to son about the need for her to take Calcium/Vit D twice daily, and for her continue to get regular dental checkups.  All questions answered.  Consent signed by son.

## 2012-10-29 ENCOUNTER — Emergency Department (HOSPITAL_COMMUNITY): Payer: Self-pay

## 2012-10-29 ENCOUNTER — Emergency Department (HOSPITAL_COMMUNITY)
Admission: EM | Admit: 2012-10-29 | Discharge: 2012-10-29 | Disposition: A | Discharge status: Home / Self Care | Payer: Self-pay | Attending: Emergency Medicine | Admitting: Emergency Medicine

## 2012-10-29 ENCOUNTER — Encounter (HOSPITAL_COMMUNITY): Payer: Self-pay | Admitting: Emergency Medicine

## 2012-10-29 DIAGNOSIS — Z79899 Other long term (current) drug therapy: Secondary | ICD-10-CM | POA: Insufficient documentation

## 2012-10-29 DIAGNOSIS — Z853 Personal history of malignant neoplasm of breast: Secondary | ICD-10-CM | POA: Insufficient documentation

## 2012-10-29 DIAGNOSIS — M810 Age-related osteoporosis without current pathological fracture: Secondary | ICD-10-CM | POA: Insufficient documentation

## 2012-10-29 DIAGNOSIS — Z8719 Personal history of other diseases of the digestive system: Secondary | ICD-10-CM | POA: Insufficient documentation

## 2012-10-29 DIAGNOSIS — M25569 Pain in unspecified knee: Secondary | ICD-10-CM | POA: Insufficient documentation

## 2012-10-29 DIAGNOSIS — M79609 Pain in unspecified limb: Secondary | ICD-10-CM

## 2012-10-29 HISTORY — DX: Age-related osteoporosis without current pathological fracture: M81.0

## 2012-10-29 MED ORDER — HYDROCODONE-ACETAMINOPHEN 5-325 MG PO TABS: 2.0000 | ORAL_TABLET | Freq: Four times a day (QID) | ORAL | Status: DC | PRN

## 2012-10-29 NOTE — ED Notes (Signed)
Patient transported to X-ray 

## 2012-10-29 NOTE — ED Provider Notes (Signed)
History     CSN: [id number]  Arrival date & time 10/29/12  0944   First MD Initiated Contact with Patient 10/29/12 0957      Chief Complaint  Patient presents with  . Joint Swelling    (Consider location/radiation/quality/duration/timing/severity/associated sxs/prior treatment) HPI Comments: The patient presents with a several day history of pain in the right knee and left ankle.  She denies any injury or trauma.  No fevers or chills.  It is worse when she walks and better with rest.  She has a history of breast cancer and recently went through treatment.  Is on fosamax.    She only speaks arabic.  History taken from son who acts as Nurse, learning disability.    The history is provided by the patient.    Past Medical History  Diagnosis Date  . Abscess   . IBS (irritable bowel syndrome)   . Breast mass   . Cancer   . Osteoporosis     Past Surgical History  Procedure Date  . Breast lumpectomy   . Cesarean section     Family History  Problem Relation Age of Onset  . Cancer Father     liver  . Cancer Mother     liver    History  Substance Use Topics  . Smoking status: Never Smoker   . Smokeless tobacco: Not on file  . Alcohol Use: No    OB History    Grav Para Term Preterm Abortions TAB SAB Ect Mult Living                  Review of Systems  All other systems reviewed and are negative.    Allergies  Review of patient's allergies indicates no known allergies.  Home Medications   Current Outpatient Rx  Name  Route  Sig  Dispense  Refill  . ALENDRONATE SODIUM 70 MG PO TABS   Oral   Take 1 tablet (70 mg total) by mouth every 7 (seven) days. Take with a full glass of water on an empty stomach.   12 tablet   3   . CALCIUM CARBONATE 600 MG PO TABS   Oral   Take 600 mg by mouth 2 (two) times daily with a meal.         . LETROZOLE 2.5 MG PO TABS   Oral   Take 1 tablet (2.5 mg total) by mouth daily.   90 tablet   3   . PROMETHAZINE HCL 25 MG PO TABS   Oral   Take 1 tablet (25 mg total) by mouth every 6 (six) hours as needed for nausea.   30 tablet   5     BP 142/69  Pulse 81  Temp 98.7 F (37.1 C) (Oral)  Resp 18  SpO2 100%  Physical Exam  Nursing note and vitals reviewed. Constitutional: She is oriented to person, place, and time. She appears well-developed and well-nourished. No distress.  HENT:  Head: Normocephalic and atraumatic.  Neck: Normal range of motion. Neck supple.  Cardiovascular: Normal rate and regular rhythm.   Pulmonary/Chest: Effort normal.  Musculoskeletal:       The left knee appears grossly normal.  There is slight swelling just medial to the patella.  There is good range of motion with no crepitus.  Stable ap/laterally.  Negative drawer tests.    The right ankle has mild ttp and swelling over the anterior talofibular ligament.  The joint has good range of motion and is stable.  Neurological: She is alert and oriented to person, place, and time.  Skin: Skin is warm and dry. She is not diaphoretic.    ED Course  Procedures (including critical care time)  Labs Reviewed - No data to display No results found.   No diagnosis found.    MDM  The patient presents with pain in both legs.  This seems musculoskeletal.  Nothing to suggest dvt or other emergent cause.  Will treat with nsaids, pain meds, and rest.  Follow up prn.        Geoffery Lyons, MD 10/29/12 [phone]

## 2012-10-29 NOTE — ED Notes (Addendum)
Pt c/o left knee swelling and right ankle. Pt has hx of breast cancer tumor removal and still takes medicines. Pt has hx of osteoporosis. Denies injury. Pain with movement and puts pressure on it.

## 2012-11-13 ENCOUNTER — Encounter: Payer: Self-pay | Admitting: *Deleted

## 2012-11-13 NOTE — Progress Notes (Signed)
Called and spoke to family member of patient stating she has been having bloody discharge for a couple of weeks from her left nipple and is now red, swollen, and painful.  She states they contacted the surgeon's office and was given an appt. For January 30,2014.  This RN called and spoke with Penni Bombard at [facility] and was able to get her an appt. For this Thursday 11/16/12 at 0800.  Informed family member who will accompany patient as she does not speak [place].

## 2012-11-16 ENCOUNTER — Encounter (INDEPENDENT_AMBULATORY_CARE_PROVIDER_SITE_OTHER): Payer: Self-pay | Admitting: General Surgery

## 2012-11-16 ENCOUNTER — Ambulatory Visit (INDEPENDENT_AMBULATORY_CARE_PROVIDER_SITE_OTHER): Payer: Self-pay | Admitting: General Surgery

## 2012-11-16 VITALS — BP 138/64 | HR 72 | Resp 16 | Ht 62.0 in | Wt 142.8 lb

## 2012-11-16 DIAGNOSIS — C50919 Malignant neoplasm of unspecified site of unspecified female breast: Secondary | ICD-10-CM

## 2012-11-16 NOTE — Progress Notes (Signed)
Subjective:     Patient ID: Michele Hale, female   DOB: April 06, 1946, 67 y.o.   MRN: [id number]  HPI The patient is a 67 year old black female who is 2 years status post left breast lumpectomy and negative sentinel node biopsy for a T1 C. N0 left breast cancer. She is taking Femara daily and tolerated well. Her main complaint is that over the last 3 weeks she has been noticing bloody discharge from her left nipple. This has been occurring spontaneously and with compression. She also notes some occasional sharp stabbing pain in the left breast and chest. She denies any shortness of breath.  Review of Systems  Constitutional: Negative.   HENT: Negative.   Eyes: Negative.   Respiratory: Negative.   Cardiovascular: Positive for chest pain.  Gastrointestinal: Negative.   Genitourinary: Negative.   Musculoskeletal: Negative.   Skin: Negative.   Neurological: Negative.   Hematological: Negative.   Psychiatric/Behavioral: Negative.        Objective:   Physical Exam  Constitutional: She is oriented to person, place, and time. She appears well-developed and well-nourished.  HENT:  Head: Normocephalic and atraumatic.  Eyes: Conjunctivae normal and EOM are normal. Pupils are equal, round, and reactive to light.  Neck: Normal range of motion. Neck supple.  Cardiovascular: Normal rate, regular rhythm and normal heart sounds.   Pulmonary/Chest: Effort normal and breath sounds normal.       She continues to have stable fullness beneath the left breast incision mostly secondary to radiation. There is bloody discharge from the nipple with palpation of the left breast. There is no other palpable mass in the left breast. There is no palpable mass in the right breast. There is no palpable axillary or supraclavicular cervical lymphadenopathy.  Abdominal: Soft. Bowel sounds are normal. She exhibits no mass. There is no tenderness.  Musculoskeletal: Normal range of motion.  Lymphadenopathy:    She has no  cervical adenopathy.  Neurological: She is alert and oriented to person, place, and time.  Skin: Skin is warm and dry.  Psychiatric: She has a normal mood and affect. Her behavior is normal.       Assessment:     The patient is 2 years status post left breast lumpectomy for breast cancer. She now has new left-sided bloody nipple discharge and continues to have palpable fullness beneath her scar    Plan:     At this point I would recommend a repeat mammogram and ultrasound to evaluate the bloody nipple discharge. She may also need an MRI study. We will call her with the results of the mammogram and ultrasound and then proceed accordingly.

## 2012-11-16 NOTE — Patient Instructions (Signed)
Will get mammogram and ultrasound left breast

## 2012-11-21 ENCOUNTER — Telehealth (INDEPENDENT_AMBULATORY_CARE_PROVIDER_SITE_OTHER): Payer: Self-pay

## 2012-11-21 NOTE — Telephone Encounter (Signed)
Gave appt info to son.

## 2012-11-21 NOTE — Telephone Encounter (Signed)
Message copied by Brennan Bailey on Tue Nov 21, 2012  2:12 PM ------      Message from: Larry Sierras      Created: Tue Nov 21, 2012 12:36 PM      Regarding: SCHED MG      Contact: [phone]       REQUESTING MG DATE/COULD YOU CK IF ORDER DONE?  GM

## 2012-11-22 ENCOUNTER — Ambulatory Visit
Admission: RE | Admit: 2012-11-22 | Discharge: 2012-11-22 | Disposition: A | Discharge status: Home / Self Care | Payer: Self-pay | Source: Ambulatory Visit | Attending: General Surgery | Admitting: General Surgery

## 2012-11-22 DIAGNOSIS — C50919 Malignant neoplasm of unspecified site of unspecified female breast: Secondary | ICD-10-CM

## 2012-11-23 ENCOUNTER — Ambulatory Visit (INDEPENDENT_AMBULATORY_CARE_PROVIDER_SITE_OTHER): Payer: Self-pay | Admitting: General Surgery

## 2012-11-23 ENCOUNTER — Telehealth (INDEPENDENT_AMBULATORY_CARE_PROVIDER_SITE_OTHER): Payer: Self-pay

## 2012-11-23 NOTE — Telephone Encounter (Signed)
Pt's son calling the office for mgm results.  Would like to know if Dr. Carolynne Edouard will need to see his mother again.  Please call him.

## 2012-11-28 ENCOUNTER — Encounter (INDEPENDENT_AMBULATORY_CARE_PROVIDER_SITE_OTHER): Payer: Self-pay | Admitting: General Surgery

## 2012-11-28 ENCOUNTER — Ambulatory Visit (INDEPENDENT_AMBULATORY_CARE_PROVIDER_SITE_OTHER): Payer: Self-pay | Admitting: General Surgery

## 2012-11-28 VITALS — BP 140/82 | HR 63 | Temp 96.6°F | Resp 16 | Ht 65.0 in | Wt 142.8 lb

## 2012-11-28 DIAGNOSIS — C50919 Malignant neoplasm of unspecified site of unspecified female breast: Secondary | ICD-10-CM

## 2012-11-29 NOTE — Progress Notes (Signed)
Subjective:     Patient ID: Michele Hale, female   DOB: October 16, 1946, 67 y.o.   MRN: [id number]  HPI The patient is a 67 year old black female who is approximately 2 years status post left breast lumpectomy and negative sentinel node biopsy for a T1cN0 left breast cancer. She is taking Femara and tolerating it well. She recently developed some bloody a left sided nipple drainage. She was evaluated with mammogram and ultrasound. After speaking with the radiologist they feel as though there is no evidence of recurrent tumor at the operative site in the left breast but that she has developed a fistula through the duct system from the residual seroma cavity to the nipple.  Review of Systems  Constitutional: Negative.   HENT: Negative.   Eyes: Negative.   Respiratory: Negative.   Cardiovascular: Negative.   Gastrointestinal: Negative.   Genitourinary: Negative.   Musculoskeletal: Negative.   Skin: Negative.   Neurological: Negative.   Hematological: Negative.   Psychiatric/Behavioral: Negative.        Objective:   Physical Exam  Constitutional: She is oriented to person, place, and time. She appears well-developed and well-nourished.  HENT:  Head: Normocephalic and atraumatic.  Eyes: Conjunctivae normal and EOM are normal. Pupils are equal, round, and reactive to light.  Neck: Normal range of motion. Neck supple.  Cardiovascular: Normal rate, regular rhythm and normal heart sounds.   Pulmonary/Chest: Effort normal and breath sounds normal.       There is a palpable firmness in the lower outer left breast consistent with radiation change of her lumpectomy site which is persistent.  Abdominal: Soft. Bowel sounds are normal. She exhibits no mass. There is no tenderness.  Musculoskeletal: Normal range of motion.  Lymphadenopathy:    She has no cervical adenopathy.  Neurological: She is alert and oriented to person, place, and time.  Skin: Skin is warm and dry.  Psychiatric: She has a  normal mood and affect. Her behavior is normal.       Assessment:     At this point it appears as though she may redevelop a connection through the duct system from the seroma cavity to the nipple. This point I think the only way to stop the drainage would probably be to re excise the area. Unfortunately the area of firmness is so large that she would probably be better off with the mastectomy. I've discussed this with her and her family in detail and at this point she would like to wait and see what happens over the next several months.    Plan:     At this point she will continue to do regular self exams. She will try to manage the drainage from the left nipple. We will plan to see her back in about 6 months.

## 2012-12-18 ENCOUNTER — Telehealth: Payer: Self-pay | Admitting: *Deleted

## 2012-12-18 NOTE — Telephone Encounter (Signed)
Confirmed new appt date and time for 01/23/13 at 1:00 to see Larina Bras, NP.  Pt to continue care with Dr. Welton Flakes

## 2013-01-13 ENCOUNTER — Encounter: Payer: Self-pay | Admitting: *Deleted

## 2013-01-13 ENCOUNTER — Encounter: Payer: Self-pay | Admitting: Oncology

## 2013-01-13 NOTE — Progress Notes (Signed)
Mailed letter & calendar to pt. 

## 2013-01-17 ENCOUNTER — Encounter: Payer: Self-pay | Admitting: *Deleted

## 2013-01-17 NOTE — Progress Notes (Signed)
Per Dr. Newt Lukes put pt on her schedule.

## 2013-01-23 ENCOUNTER — Telehealth: Payer: Self-pay | Admitting: Oncology

## 2013-01-23 ENCOUNTER — Encounter: Payer: Self-pay | Admitting: Oncology

## 2013-01-23 ENCOUNTER — Ambulatory Visit: Payer: Self-pay | Admitting: Family

## 2013-01-23 ENCOUNTER — Ambulatory Visit (HOSPITAL_BASED_OUTPATIENT_CLINIC_OR_DEPARTMENT_OTHER): Payer: Self-pay | Admitting: Lab

## 2013-01-23 ENCOUNTER — Ambulatory Visit (HOSPITAL_BASED_OUTPATIENT_CLINIC_OR_DEPARTMENT_OTHER): Payer: Self-pay | Admitting: Oncology

## 2013-01-23 VITALS — BP 137/78 | HR 90 | Temp 98.4°F | Resp 20 | Ht 65.0 in | Wt 142.6 lb

## 2013-01-23 DIAGNOSIS — C50912 Malignant neoplasm of unspecified site of left female breast: Secondary | ICD-10-CM

## 2013-01-23 DIAGNOSIS — M810 Age-related osteoporosis without current pathological fracture: Secondary | ICD-10-CM

## 2013-01-23 DIAGNOSIS — C50919 Malignant neoplasm of unspecified site of unspecified female breast: Secondary | ICD-10-CM

## 2013-01-23 DIAGNOSIS — C50519 Malignant neoplasm of lower-outer quadrant of unspecified female breast: Secondary | ICD-10-CM

## 2013-01-23 LAB — CBC WITH DIFFERENTIAL/PLATELET
BASO%: 2.1 % — ABNORMAL HIGH (ref 0.0–2.0)
Basophils Absolute: 0.1 10*3/uL (ref 0.0–0.1)
HCT: 36.8 % (ref 34.8–46.6)
HGB: 12.2 g/dL (ref 11.6–15.9)
LYMPH%: 30.7 % (ref 14.0–49.7)
MCHC: 33.2 g/dL (ref 31.5–36.0)
MONO#: 0.3 10*3/uL (ref 0.1–0.9)
NEUT%: 46.8 % (ref 38.4–76.8)
Platelets: 289 10*3/uL (ref 145–400)
WBC: 3.7 10*3/uL — ABNORMAL LOW (ref 3.9–10.3)
lymph#: 1.1 10*3/uL (ref 0.9–3.3)

## 2013-01-23 LAB — COMPREHENSIVE METABOLIC PANEL (CC13)
Albumin: 3.5 g/dL (ref 3.5–5.0)
BUN: 17.4 mg/dL (ref 7.0–26.0)
CO2: 30 mEq/L — ABNORMAL HIGH (ref 22–29)
Calcium: 9.7 mg/dL (ref 8.4–10.4)
Chloride: 104 mEq/L (ref 98–107)
Creatinine: 0.7 mg/dL (ref 0.6–1.1)
Glucose: 150 mg/dl — ABNORMAL HIGH (ref 70–99)
Potassium: 4.1 mEq/L (ref 3.5–5.1)

## 2013-01-23 MED ORDER — VITAMIN D 1000 UNITS PO TABS: 1000.0000 [IU] | ORAL_TABLET | Freq: Every day | ORAL | Status: AC

## 2013-01-23 MED ORDER — LETROZOLE 2.5 MG PO TABS: 2.5000 mg | ORAL_TABLET | Freq: Every day | ORAL | Status: DC

## 2013-01-23 NOTE — Patient Instructions (Addendum)
Doing well  Continue taking letrozole 2.5 mg   Take vitamin D3 1000iu daily  Take super B complex  Stop fosamax as we are giving you prolia

## 2013-01-23 NOTE — Telephone Encounter (Signed)
Pt sent back to lab and relative given schedule for June and December.

## 2013-02-02 ENCOUNTER — Emergency Department (HOSPITAL_COMMUNITY)
Admission: EM | Admit: 2013-02-02 | Discharge: 2013-02-02 | Disposition: A | Discharge status: Home Health | Payer: No Typology Code available for payment source | Source: Home / Self Care | Attending: Family Medicine | Admitting: Family Medicine

## 2013-02-02 ENCOUNTER — Encounter (HOSPITAL_COMMUNITY): Payer: Self-pay | Admitting: *Deleted

## 2013-02-02 DIAGNOSIS — J309 Allergic rhinitis, unspecified: Secondary | ICD-10-CM | POA: Diagnosis present

## 2013-02-02 DIAGNOSIS — M25562 Pain in left knee: Secondary | ICD-10-CM

## 2013-02-02 DIAGNOSIS — C50919 Malignant neoplasm of unspecified site of unspecified female breast: Secondary | ICD-10-CM

## 2013-02-02 DIAGNOSIS — M5430 Sciatica, unspecified side: Secondary | ICD-10-CM | POA: Diagnosis present

## 2013-02-02 DIAGNOSIS — M5416 Radiculopathy, lumbar region: Secondary | ICD-10-CM | POA: Diagnosis present

## 2013-02-02 DIAGNOSIS — M25569 Pain in unspecified knee: Secondary | ICD-10-CM | POA: Diagnosis present

## 2013-02-02 DIAGNOSIS — M5431 Sciatica, right side: Secondary | ICD-10-CM

## 2013-02-02 DIAGNOSIS — M1990 Unspecified osteoarthritis, unspecified site: Secondary | ICD-10-CM | POA: Diagnosis present

## 2013-02-02 LAB — HEMOGLOBIN A1C
Hgb A1c MFr Bld: 6.1 % — ABNORMAL HIGH (ref <5.7)
Mean Plasma Glucose: 128 mg/dL — ABNORMAL HIGH (ref <117)

## 2013-02-02 LAB — GLUCOSE, CAPILLARY: Glucose-Capillary: 129 mg/dL — ABNORMAL HIGH (ref 70–99)

## 2013-02-02 MED ORDER — TRAMADOL HCL 50 MG PO TABS: 50.0000 mg | ORAL_TABLET | Freq: Every evening | ORAL | Status: DC | PRN

## 2013-02-02 MED ORDER — LORATADINE 10 MG PO TABS: 10.0000 mg | ORAL_TABLET | Freq: Every day | ORAL | Status: DC

## 2013-02-02 NOTE — ED Provider Notes (Signed)
History    CSN: [id number]  Arrival date & time 02/02/13  1529   First MD Initiated Contact with Patient 02/02/13 1555      CC:  Leg pain    (Consider location/radiation/quality/duration/timing/severity/associated sxs/prior treatment) HPI Pt says that she is having pain in her left knee and having pain that is shooting down her right leg.  This has been a problem that she has had for a long time.   Pt has a history of breast cancer and history of osteoporosis.  Pt takes phenergan if she is feeling nausea and Vitamin D with calcium and tylenol.  Pt has been having trouble with her back and legs for the past 3 years.  Pt says that when she takes the tylenol it does help with the symptoms.     Past Medical History  Diagnosis Date  . Abscess   . IBS (irritable bowel syndrome)   . Breast mass   . Osteoporosis   . Cancer     breast - lt    Past Surgical History  Procedure Laterality Date  . Cesarean section    . Breast lumpectomy      lt breast    Family History  Problem Relation Age of Onset  . Cancer Father     liver  . Cancer Mother     liver    History  Substance Use Topics  . Smoking status: Never Smoker   . Smokeless tobacco: Not on file  . Alcohol Use: No    OB History   Grav Para Term Preterm Abortions TAB SAB Ect Mult Living                  Review of Systems Constitutional: Negative.  HENT: Negative.  Respiratory: Negative.  Cardiovascular: Negative.  Gastrointestinal: Negative.  Endocrine: Negative.  Genitourinary: Negative.  Musculoskeletal: left knee pain, right leg pain  Skin: Negative.  Allergic/Immunologic: Negative.  Neurological: Negative.  Hematological: Negative.  Psychiatric/Behavioral: Negative.  All other systems reviewed and are negative  Allergies  Review of patient's allergies indicates no known allergies.  Home Medications   Current Outpatient Rx  Name  Route  Sig  Dispense  Refill  . acetaminophen (TYLENOL) 500 MG  tablet   Oral   Take 1,000 mg by mouth every 6 (six) hours as needed. For arthritis pain         . calcium-vitamin D (OSCAL WITH D) 500-200 MG-UNIT per tablet   Oral   Take 1 tablet by mouth 2 (two) times daily.         . cholecalciferol (VITAMIN D) 1000 UNITS tablet   Oral   Take 1 tablet (1,000 Units total) by mouth daily.   30 tablet   12   . letrozole (FEMARA) 2.5 MG tablet   Oral   Take 1 tablet (2.5 mg total) by mouth daily.   90 tablet   12   . promethazine (PHENERGAN) 25 MG tablet   Oral   Take 25 mg by mouth every 6 (six) hours as needed. For nausea           BP 138/63  Pulse 106  Temp(Src) 97.9 F (36.6 C) (Oral)  Resp 18  SpO2 98%  Physical Exam  Nursing note and vitals reviewed.  Constitutional: She is oriented to person, place, and time. She appears well-developed and well-nourished. No distress.  HENT: swollen nasal turbinates bilateral  Head: Normocephalic and atraumatic.  Eyes: Conjunctivae and EOM are normal. Pupils are  equal, round, and reactive to light.  Neck: Normal range of motion. Neck supple. No JVD present. No tracheal deviation present. No thyromegaly present.  Cardiovascular: Normal rate, regular rhythm and normal heart sounds.  Pulmonary/Chest: Effort normal and breath sounds normal. No respiratory distress. She has no wheezes.  Abdominal: Soft. Bowel sounds are normal.  Musculoskeletal: tenderness and crepitus of left knee, muscular tenderness of right leg, no edema, no calf cords, tenderness of lumbar spine Lymphadenopathy:  She has no cervical adenopathy.  Neurological: She is alert and oriented to person, place, and time. She has normal reflexes.  Skin: Skin is warm and dry.  Psychiatric: She has a normal mood and affect. Her behavior is normal. Judgment and thought content normal.    ED Course  Procedures (including critical care time)  Labs Reviewed - No data to display No results found.   No diagnosis found.  MDM   IMPRESSION  Osteoarthritis of knees, L>R  History of breast cancer  Lumbar spine pain with right radiculopathy  Sciatica, right   Allergic rhinitis   RECOMMENDATIONS / PLAN Trial of tramadol 50 mg po every evening before bed prn severe knee and back pain Loratadine 10 mg po daily   FOLLOW UP 6 weeks   The patient was given clear instructions to go to ER or return to medical center if symptoms don't improve, worsen or new problems develop.  The patient verbalized understanding.  The patient was told to call to get lab results if they haven't heard anything in the next week.            Cleora Fleet, MD 02/02/13 1616

## 2013-02-05 NOTE — ED Notes (Signed)
Referral faxed to [facility] 

## 2013-02-06 NOTE — Progress Notes (Signed)
Quick Note:  Please inform patient that her blood sugars came back elevated and her A1c suggests that she has prediabetes. She should start a low sugar diet and low carb diet and start walking 30 mins at least 5 times per day. Recheck labs in 4 months.  Rodney Langton, MD, CDE, FAAFP [facility] [place], [place]   ______

## 2013-02-07 ENCOUNTER — Ambulatory Visit: Payer: Self-pay | Admitting: Oncology

## 2013-02-07 ENCOUNTER — Other Ambulatory Visit: Payer: Self-pay | Admitting: Lab

## 2013-02-08 ENCOUNTER — Telehealth (HOSPITAL_COMMUNITY): Payer: Self-pay

## 2013-02-08 NOTE — ED Notes (Signed)
Lab results given

## 2013-02-12 ENCOUNTER — Encounter (INDEPENDENT_AMBULATORY_CARE_PROVIDER_SITE_OTHER): Payer: Self-pay | Admitting: General Surgery

## 2013-02-12 ENCOUNTER — Ambulatory Visit (INDEPENDENT_AMBULATORY_CARE_PROVIDER_SITE_OTHER): Payer: PRIVATE HEALTH INSURANCE | Admitting: General Surgery

## 2013-02-12 ENCOUNTER — Other Ambulatory Visit (INDEPENDENT_AMBULATORY_CARE_PROVIDER_SITE_OTHER): Payer: Self-pay | Admitting: General Surgery

## 2013-02-12 VITALS — BP 100/70 | HR 89 | Temp 97.6°F | Resp 18 | Ht 65.0 in | Wt 144.0 lb

## 2013-02-12 DIAGNOSIS — C50919 Malignant neoplasm of unspecified site of unspecified female breast: Secondary | ICD-10-CM

## 2013-02-12 DIAGNOSIS — C50912 Malignant neoplasm of unspecified site of left female breast: Secondary | ICD-10-CM

## 2013-02-12 DIAGNOSIS — N6459 Other signs and symptoms in breast: Secondary | ICD-10-CM

## 2013-02-12 NOTE — Progress Notes (Signed)
Subjective:     Patient ID: Michele Hale, female   DOB: 08-Mar-1946, 67 y.o.   MRN: [id number]  HPI The patient is a 67 year old black female who is just over 2 years out from a left breast lumpectomy and negative sentinel node biopsy for a T1 C. N0 left breast cancer. At her last visit she did develop some bloody nipple discharge. Her mammograms were negative. It was felt as though she had developed a communication between her old seroma cavity in her nipple. Since her last visit of the amount of drainage from the nipple has decreased significantly. The drainage is also serous in nature and no longer bloody appearing. She denies any breast pain. She continues to take Femara and tolerates it well.  Review of Systems  Constitutional: Negative.   HENT: Negative.   Eyes: Negative.   Respiratory: Negative.   Cardiovascular: Negative.   Gastrointestinal: Negative.   Endocrine: Negative.   Genitourinary: Negative.   Musculoskeletal: Negative.   Skin: Negative.   Allergic/Immunologic: Negative.   Neurological: Negative.   Hematological: Negative.   Psychiatric/Behavioral: Negative.        Objective:   Physical Exam  Constitutional: She is oriented to person, place, and time. She appears well-developed and well-nourished.  HENT:  Head: Normocephalic and atraumatic.  Eyes: Conjunctivae and EOM are normal. Pupils are equal, round, and reactive to light.  Neck: Normal range of motion. Neck supple.  Cardiovascular: Normal rate, regular rhythm and normal heart sounds.   Pulmonary/Chest: Effort normal and breath sounds normal.  The palpable fullness beneath the scar on the inferior left breast seems less than on her previous exam. Other than this there is no other palpable mass in either breast. There is no palpable axillary or supraclavicular cervical lymphadenopathy. There is only a very small amount of serous drainage from the left nipple with firm palpation of the inferior left breast   Abdominal: Soft. Bowel sounds are normal. She exhibits no mass. There is no tenderness.  Musculoskeletal: Normal range of motion.  Lymphadenopathy:    She has no cervical adenopathy.  Neurological: She is alert and oriented to person, place, and time.  Skin: Skin is warm and dry.  Psychiatric: She has a normal mood and affect. Her behavior is normal.       Assessment:     The patient is just over 2 years status post left breast lumpectomy for breast cancer with some left nipple discharge     Plan:     At this point she is happy that the discharge is significantly improved. She will continue to do regular self exams. She will continue to take Femara. She does not desire any further surgery. We will plan to see her back in about 6 months.

## 2013-02-12 NOTE — Patient Instructions (Signed)
Continue femara Continue regular self exams 

## 2013-02-16 ENCOUNTER — Ambulatory Visit: Payer: Self-pay | Admitting: Oncology

## 2013-02-16 ENCOUNTER — Other Ambulatory Visit: Payer: Self-pay | Admitting: Lab

## 2013-02-19 NOTE — Progress Notes (Signed)
OFFICE PROGRESS NOTE  CC** Dr. Chevis Pretty  DIAGNOSIS: 67 year old female with history of left breast lumpectomy and sentinel lymph node biopsy for a T1 C. N0 breast cancer.  PRIOR THERAPY:  #1 patient underwent a lumpectomy of the left breast that revealed a T1 C. N0 invasive mammary carcinoma all nodes were negative tumor was ER/PR positive.  #2 she then went on to have radiation therapy to the left breast followed by Femara 2.5 mg daily by Dr. Pierce Crane.  #3 patient recently was seen by Dr. Chevis Pretty with complaints of nipple discharge which has slowly improved.  CURRENT THERAPY: Femara 2.5 mg daily  INTERVAL HISTORY: Michele Hale 67 y.o. female returns for followup visit. Clinically she seems to be doing well now without any problems. She denies any fevers chills night sweats headaches no shortness of breath no nipple discharge. Patient also has osteopenia/osteoporosis and she is on prolia. Remainder of the 10 point review of systems is negative.  MEDICAL HISTORY: Past Medical History  Diagnosis Date  . Abscess   . IBS (irritable bowel syndrome)   . Breast mass   . Osteoporosis   . Cancer     breast - lt    ALLERGIES:  has No Known Allergies.  MEDICATIONS:  Current Outpatient Prescriptions  Medication Sig Dispense Refill  . acetaminophen (TYLENOL) 500 MG tablet Take 1,000 mg by mouth every 6 (six) hours as needed. For arthritis pain      . calcium-vitamin D (OSCAL WITH D) 500-200 MG-UNIT per tablet Take 1 tablet by mouth 2 (two) times daily.      Marland Kitchen letrozole (FEMARA) 2.5 MG tablet Take 1 tablet (2.5 mg total) by mouth daily.  90 tablet  12  . promethazine (PHENERGAN) 25 MG tablet Take 25 mg by mouth every 6 (six) hours as needed. For nausea      . cholecalciferol (VITAMIN D) 1000 UNITS tablet Take 1 tablet (1,000 Units total) by mouth daily.  30 tablet  12  . loratadine (CLARITIN) 10 MG tablet Take 1 tablet (10 mg total) by mouth daily.  30 tablet  3  . traMADol  (ULTRAM) 50 MG tablet Take 1 tablet (50 mg total) by mouth at bedtime as needed for pain (back pain and leg pain and knee pain).  30 tablet  1   No current facility-administered medications for this visit.    SURGICAL HISTORY:  Past Surgical History  Procedure Laterality Date  . Cesarean section    . Breast lumpectomy      lt breast    REVIEW OF SYSTEMS:  Pertinent items are noted in HPI.   HEALTH MAINTENANCE:   PHYSICAL EXAMINATION: Blood pressure 137/78, pulse 90, temperature 98.4 F (36.9 C), temperature source Oral, resp. rate 20, height 5\' 5"  (1.651 m), weight 142 lb 9.6 oz (64.683 kg). Body mass index is 23.73 kg/(m^2). ECOG PERFORMANCE STATUS: 1 - Symptomatic but completely ambulatory   General appearance: alert and cooperative Lymph nodes: Cervical, supraclavicular, and axillary nodes normal. Resp: clear to auscultation bilaterally Cardio: regular rate and rhythm GI: soft, non-tender; bowel sounds normal; no masses,  no organomegaly Extremities: extremities normal, atraumatic, no cyanosis or edema Neurologic: Grossly normal Left breast well-healed surgical scar there is no nipple discharge no tenderness. Right breast no masses or nipple discharge.  LABORATORY DATA: Lab Results  Component Value Date   WBC 3.7* 01/23/2013   HGB 12.2 01/23/2013   HCT 36.8 01/23/2013   MCV 80.9 01/23/2013   PLT 289  01/23/2013      Chemistry      Component Value Date/Time   NA 143 01/23/2013 1420   NA 139 02/07/2012 1312   K 4.1 01/23/2013 1420   K 4.2 02/07/2012 1312   CL 104 01/23/2013 1420   CL 103 02/07/2012 1312   CO2 30* 01/23/2013 1420   CO2 26 02/07/2012 1312   BUN 17.4 01/23/2013 1420   BUN 13 02/07/2012 1312   CREATININE 0.7 01/23/2013 1420   CREATININE 0.73 02/07/2012 1312      Component Value Date/Time   CALCIUM 9.7 01/23/2013 1420   CALCIUM 9.8 02/07/2012 1312   ALKPHOS 61 01/23/2013 1420   ALKPHOS 63 02/07/2012 1312   AST 14 01/23/2013 1420   AST 17 02/07/2012 1312   ALT 11 01/23/2013 1420    ALT 15 02/07/2012 1312   BILITOT 0.20 01/23/2013 1420   BILITOT 0.3 02/07/2012 1312       RADIOGRAPHIC STUDIES:  No results found.  ASSESSMENT: 67 year old female with  #1 stage I invasive mammary carcinoma of the left breast status post lumpectomy followed by radiation now on Femara 2.5 mg daily tolerating it well. Nipple discharge has improved.  #2 osteoporosis/osteopenia on prolia. Patient has also been taking Fosamax I have recommended she discontinue.Marland Kitchen   PLAN:   #1 continue Femara.  #2 proceed with prolia every 6 months, risks and benefits of this were discussed with the patient including osteonecrosis of the jaw complications.  #3 she will be seen back in 6 months time.   All questions were answered. The patient knows to call the clinic with any problems, questions or concerns. We can certainly see the patient much sooner if necessary.  I spent 40 minutes counseling the patient face to face. The total time spent in the appointment was 40 minutes.    Drue Second, MD Medical/Oncology [facility] [phone] (beeper) [phone] (Office)

## 2013-02-20 ENCOUNTER — Encounter (INDEPENDENT_AMBULATORY_CARE_PROVIDER_SITE_OTHER): Payer: Self-pay | Admitting: General Surgery

## 2013-02-22 ENCOUNTER — Ambulatory Visit
Admission: RE | Admit: 2013-02-22 | Discharge: 2013-02-22 | Disposition: A | Discharge status: Home / Self Care | Payer: Self-pay | Source: Ambulatory Visit | Attending: General Surgery | Admitting: General Surgery

## 2013-02-22 DIAGNOSIS — N6459 Other signs and symptoms in breast: Secondary | ICD-10-CM

## 2013-02-28 ENCOUNTER — Telehealth (INDEPENDENT_AMBULATORY_CARE_PROVIDER_SITE_OTHER): Payer: Self-pay

## 2013-02-28 NOTE — Telephone Encounter (Signed)
LMOM. Patients [place] is stable per Dr Caralyn Guile note. Follow up in 6 months with Dr Carolynne Edouard.

## 2013-03-29 ENCOUNTER — Ambulatory Visit (HOSPITAL_COMMUNITY)
Admission: RE | Admit: 2013-03-29 | Discharge: 2013-03-29 | Disposition: A | Discharge status: Home / Self Care | Payer: No Typology Code available for payment source | Source: Ambulatory Visit | Attending: Internal Medicine | Admitting: Internal Medicine

## 2013-03-29 ENCOUNTER — Ambulatory Visit: Payer: No Typology Code available for payment source | Attending: Family Medicine | Admitting: Internal Medicine

## 2013-03-29 VITALS — BP 164/81 | HR 82 | Temp 98.0°F | Resp 15 | Wt 144.0 lb

## 2013-03-29 DIAGNOSIS — M47812 Spondylosis without myelopathy or radiculopathy, cervical region: Secondary | ICD-10-CM | POA: Insufficient documentation

## 2013-03-29 DIAGNOSIS — M542 Cervicalgia: Secondary | ICD-10-CM | POA: Insufficient documentation

## 2013-03-29 DIAGNOSIS — M5412 Radiculopathy, cervical region: Secondary | ICD-10-CM | POA: Insufficient documentation

## 2013-03-29 DIAGNOSIS — M5030 Other cervical disc degeneration, unspecified cervical region: Secondary | ICD-10-CM | POA: Insufficient documentation

## 2013-03-29 DIAGNOSIS — M25519 Pain in unspecified shoulder: Secondary | ICD-10-CM | POA: Insufficient documentation

## 2013-03-29 MED ORDER — METHOCARBAMOL 500 MG PO TABS: 500.0000 mg | ORAL_TABLET | Freq: Two times a day (BID) | ORAL | Status: AC

## 2013-03-29 MED ORDER — PREDNISONE 20 MG PO TABS: 40.0000 mg | ORAL_TABLET | Freq: Every day | ORAL | Status: AC

## 2013-03-29 MED ORDER — DICLOFENAC SODIUM 1 % TD GEL: 2.0000 g | Freq: Four times a day (QID) | TRANSDERMAL | Status: DC

## 2013-03-29 NOTE — Progress Notes (Signed)
Patient ID: Michele Hale, female   DOB: 1946-04-02, 67 y.o.   MRN: [id number]   CC:  HPI: 67 year old female status post lumpectomy on the left, negative sentinel node, presents with left-sided neck pain. The patient has very tense muscles, and almost some localized swelling above her left trapezius muscle. She denies any numbness and tingling in her arm. She denies any swelling of her left arm. She denies any chest pain or shortness of breath. She denies any history of trauma to her neck.   No Known Allergies Past Medical History  Diagnosis Date  . Abscess   . IBS (irritable bowel syndrome)   . Breast mass   . Osteoporosis   . Cancer     breast - lt   Current Outpatient Prescriptions on File Prior to Visit  Medication Sig Dispense Refill  . acetaminophen (TYLENOL) 500 MG tablet Take 1,000 mg by mouth every 6 (six) hours as needed. For arthritis pain      . calcium-vitamin D (OSCAL WITH D) 500-200 MG-UNIT per tablet Take 1 tablet by mouth 2 (two) times daily.      . cholecalciferol (VITAMIN D) 1000 UNITS tablet Take 1 tablet (1,000 Units total) by mouth daily.  30 tablet  12  . letrozole (FEMARA) 2.5 MG tablet Take 1 tablet (2.5 mg total) by mouth daily.  90 tablet  12  . loratadine (CLARITIN) 10 MG tablet Take 1 tablet (10 mg total) by mouth daily.  30 tablet  3  . promethazine (PHENERGAN) 25 MG tablet Take 25 mg by mouth every 6 (six) hours as needed. For nausea      . traMADol (ULTRAM) 50 MG tablet Take 1 tablet (50 mg total) by mouth at bedtime as needed for pain (back pain and leg pain and knee pain).  30 tablet  1   No current facility-administered medications on file prior to visit.   Family History  Problem Relation Age of Onset  . Cancer Father     liver  . Cancer Mother     liver   History   Social History  . Marital Status: Single    Spouse Name: N/A    Number of Children: N/A  . Years of Education: N/A   Occupational History  . Not on file.   Social  History Main Topics  . Smoking status: Never Smoker   . Smokeless tobacco: Not on file  . Alcohol Use: No  . Drug Use: No  . Sexually Active: Not Currently   Other Topics Concern  . Not on file   Social History Narrative  . No narrative on file    Review of Systems  Constitutional: Negative for fever, chills, diaphoresis, activity change, appetite change and fatigue.  HENT: Negative for ear pain, nosebleeds, congestion, facial swelling, rhinorrhea, neck pain, neck stiffness and ear discharge.   Eyes: Negative for pain, discharge, redness, itching and visual disturbance.  Respiratory: Negative for cough, choking, chest tightness, shortness of breath, wheezing and stridor.   Cardiovascular: Negative for chest pain, palpitations and leg swelling.  Gastrointestinal: Negative for abdominal distention.  Genitourinary: Negative for dysuria, urgency, frequency, hematuria, flank pain, decreased urine volume, difficulty urinating and dyspareunia.  Musculoskeletal: Positive for back pain, joint swelling, arthralgias and gait problem.  Neurological: Negative for dizziness, tremors, seizures, syncope, facial asymmetry, speech difficulty, weakness, light-headedness, numbness and headaches.  Hematological: Negative for adenopathy. Does not bruise/bleed easily.  Psychiatric/Behavioral: Negative for hallucinations, behavioral problems, confusion, dysphoric mood, decreased concentration and agitation.  Objective:   Filed Vitals:   03/29/13 1245  BP: 164/81  Pulse: 82  Temp: 98 F (36.7 C)  Resp: 15    Physical Exam  Constitutional: Appears well-developed and well-nourished. No distress.  HENT: Normocephalic. External right and left ear normal. Oropharynx is clear and moist.  Eyes: Conjunctivae and EOM are normal. PERRLA, no scleral icterus.  Neck: Normal ROM. Neck supple. No JVD. No tracheal deviation. No thyromegaly.  CVS: RRR, S1/S2 +, no murmurs, no gallops, no carotid bruit.   Pulmonary: Effort and breath sounds normal, no stridor, rhonchi, wheezes, rales.  Abdominal: Soft. BS +,  no distension, tenderness, rebound or guarding.  Musculoskeletal: Normal range of motion. Increased paraspinal muscle spasm to the left of the C-spine Lymphadenopathy: No lymphadenopathy noted, cervical, inguinal. Neuro: Alert. Normal reflexes, muscle tone coordination. No cranial nerve deficit. Skin: Skin is warm and dry. No rash noted. Not diaphoretic. No erythema. No pallor.  Psychiatric: Normal mood and affect. Behavior, judgment, thought content normal.   Lab Results  Component Value Date   WBC 3.7* 01/23/2013   HGB 12.2 01/23/2013   HCT 36.8 01/23/2013   MCV 80.9 01/23/2013   PLT 289 01/23/2013   Lab Results  Component Value Date   CREATININE 0.7 01/23/2013   BUN 17.4 01/23/2013   NA 143 01/23/2013   K 4.1 01/23/2013   CL 104 01/23/2013   CO2 30* 01/23/2013    Lab Results  Component Value Date   HGBA1C 6.1* 02/02/2013   Lipid Panel  No results found for this basename: chol, trig, hdl, cholhdl, vldl, ldlcalc       Assessment and plan:   Patient Active Problem List   Diagnosis Date Noted  . Allergic rhinitis 02/02/2013  . Osteoarthritis 02/02/2013  . Knee pain 02/02/2013  . Sciatica neuralgia 02/02/2013  . Lumbar radicular pain 02/02/2013  . Breast cancer 01/24/2012       Cervical radiculopathy #1 obtain plain films of the left shoulder and C-spine #2 start patient on a short course of prednisone for 7 days 40 mg a day #3 start the patient on Robaxin #4 have the patient followup in about 2 weeks #5 if pain does not resolve in 2 weeks and the patient will probably need an MRI of the C-spine

## 2013-03-29 NOTE — Progress Notes (Signed)
Patient's son states she has been having left shoulder, neck and back pain  The past 2 days. Denies any injury

## 2013-04-02 ENCOUNTER — Other Ambulatory Visit: Payer: Self-pay | Admitting: Oncology

## 2013-04-03 ENCOUNTER — Other Ambulatory Visit: Payer: Self-pay | Admitting: Emergency Medicine

## 2013-04-04 ENCOUNTER — Other Ambulatory Visit: Payer: Self-pay | Admitting: Emergency Medicine

## 2013-04-04 MED ORDER — PROMETHAZINE HCL 25 MG PO TABS: 25.0000 mg | ORAL_TABLET | Freq: Four times a day (QID) | ORAL | Status: DC | PRN

## 2013-04-04 NOTE — Telephone Encounter (Signed)
Resolved by collaborative nurse

## 2013-04-10 ENCOUNTER — Ambulatory Visit: Payer: No Typology Code available for payment source | Attending: Family Medicine | Admitting: Internal Medicine

## 2013-04-10 VITALS — BP 133/80 | HR 78 | Temp 98.6°F | Resp 16 | Ht 63.0 in | Wt 146.0 lb

## 2013-04-10 DIAGNOSIS — M542 Cervicalgia: Secondary | ICD-10-CM | POA: Insufficient documentation

## 2013-04-10 MED ORDER — TRAMADOL HCL 50 MG PO TABS: 50.0000 mg | ORAL_TABLET | Freq: Three times a day (TID) | ORAL | Status: DC | PRN

## 2013-04-10 NOTE — Progress Notes (Signed)
Patient ID: Michele Hale, female   DOB: 10-20-1946, 67 y.o.   MRN: [id number] Progress note  Michele Hale WUJ:[id number] DOB: 12/31/1945 DOA: (Not on file)    Chief Complaint: follow up for left neck pain  HPI:  67 year old female with history of breast mass status post lumpectomy here with an Arabic interpreter who was recently seen in the clinic for left neck and left shoulder pain for few weeks duration. Patient reports having sharp pain on the left side of her left neck radiating down the shoulders along with left shoulder pain aggravated on movement. She was given a course of prednisone for one week and a post that her symptoms have markedly improved. An x-ray of her left cervical spine and left shoulder were all obtained which were unremarkable except for mild degenerative changes. She denies any fever chills headache, blurred vision, weakness of her extremities, chest pain, palpitations, shortness of breath, abdominal pain, nausea, vomiting, bowel or urinary symptoms.  ROS:  as outlined in HPI  Past Medical History  Diagnosis Date  . Abscess   . IBS (irritable bowel syndrome)   . Breast mass   . Osteoporosis   . Cancer     breast - lt   Past Surgical History  Procedure Laterality Date  . Cesarean section    . Breast lumpectomy      lt breast   Social History:  reports that she has never smoked. She does not have any smokeless tobacco history on file. She reports that she does not drink alcohol or use illicit drugs.  No Known Allergies  Family History  Problem Relation Age of Onset  . Cancer Father     liver  . Cancer Mother     liver    Prior to Admission medications   Medication Sig Start Date End Date Taking? Authorizing Provider  calcium-vitamin D (OSCAL WITH D) 500-200 MG-UNIT per tablet Take 1 tablet by mouth 2 (two) times daily.   Yes Historical Provider, MD  cholecalciferol (VITAMIN D) 1000 UNITS tablet Take 1 tablet (1,000 Units total) by mouth daily.  01/23/13  Yes Victorino December, MD  diclofenac sodium (VOLTAREN) 1 % GEL Apply 2 g topically 4 (four) times daily. 03/29/13  Yes Richarda Overlie, MD  letrozole (FEMARA) 2.5 MG tablet Take 1 tablet (2.5 mg total) by mouth daily. 01/23/13  Yes Victorino December, MD  loratadine (CLARITIN) 10 MG tablet Take 1 tablet (10 mg total) by mouth daily. 02/02/13  Yes Clanford Cyndie Mull, MD  promethazine (PHENERGAN) 25 MG tablet Take 1 tablet (25 mg total) by mouth every 6 (six) hours as needed. For nausea 04/04/13  Yes Victorino December, MD  traMADol (ULTRAM) 50 MG tablet Take 1 tablet (50 mg total) by mouth at bedtime as needed for pain (back pain and leg pain and knee pain). 02/02/13  Yes Clanford Cyndie Mull, MD  acetaminophen (TYLENOL) 500 MG tablet Take 1,000 mg by mouth every 6 (six) hours as needed. For arthritis pain    Historical Provider, MD    Physical Exam:  Filed Vitals:   04/10/13 1551  BP: 133/80  Pulse: 78  Temp: 98.6 F (37 C)  TempSrc: Oral  Resp: 16  Height: 5\' 3"  (1.6 m)  Weight: 146 lb (66.225 kg)  SpO2: 97%    Constitutional: Vital signs reviewed.  Patient is a well-developed and well-nourished in no acute distress and cooperative with exam. HEENT: no pallor, moist mucosa, no swelling deformity or tenderness over left  neck or cervical area, no lymphadenopathy, no tenderness over left shoulder with normal ROM. CHEST: Clear to auscultation bilaterally, no added sounds CVS: Normal S1 and S2, no murmurs rub or gallop Abdomen: Soft, nontender, nondistended Extremities: Warm, no edema CNS: AAO x3  Radiological Exams on Admission: X ray cervical spione and left shoulder : wnl with some degenerative changes     Assessment/Plan  Left neck pain Xray of c spine and left shoulder unremarkable except for mild degenerative changes  In the disc and left shoulder jt. Pain much improved with robaxin and a course of oral prednisone . still has some pain over  left shoulder with movement, although much  unimproved. . Will prescribe a course of  oral tramadol.  Left shoulder pain  likely due to some arthritis changes as seen on x ray . Prn tramadol for pain.  Return to clinic if symptoms unimproved or pain worsens. She likely need MRI for C-spine or left shoulder in that case.   follow up in 6 months  Rosi Secrist

## 2013-04-10 NOTE — Patient Instructions (Signed)
Degenerative Disk Disease

## 2013-04-10 NOTE — Progress Notes (Signed)
Patient presents for FU to review xrays.

## 2013-04-11 ENCOUNTER — Ambulatory Visit (HOSPITAL_BASED_OUTPATIENT_CLINIC_OR_DEPARTMENT_OTHER): Payer: No Typology Code available for payment source

## 2013-04-11 ENCOUNTER — Telehealth: Payer: Self-pay | Admitting: Oncology

## 2013-04-11 ENCOUNTER — Encounter: Payer: Self-pay | Admitting: Oncology

## 2013-04-11 ENCOUNTER — Ambulatory Visit (HOSPITAL_BASED_OUTPATIENT_CLINIC_OR_DEPARTMENT_OTHER): Payer: No Typology Code available for payment source | Admitting: Oncology

## 2013-04-11 ENCOUNTER — Other Ambulatory Visit (HOSPITAL_BASED_OUTPATIENT_CLINIC_OR_DEPARTMENT_OTHER): Payer: No Typology Code available for payment source | Admitting: Lab

## 2013-04-11 ENCOUNTER — Other Ambulatory Visit: Payer: Self-pay | Admitting: Adult Health

## 2013-04-11 VITALS — BP 128/62 | HR 62 | Temp 98.7°F

## 2013-04-11 VITALS — BP 167/74 | HR 79 | Temp 98.4°F | Resp 20 | Ht 63.0 in | Wt 144.9 lb

## 2013-04-11 DIAGNOSIS — C50519 Malignant neoplasm of lower-outer quadrant of unspecified female breast: Secondary | ICD-10-CM

## 2013-04-11 DIAGNOSIS — M810 Age-related osteoporosis without current pathological fracture: Secondary | ICD-10-CM

## 2013-04-11 DIAGNOSIS — Z170 Estrogen receptor positive status [ER+]: Secondary | ICD-10-CM

## 2013-04-11 DIAGNOSIS — C50919 Malignant neoplasm of unspecified site of unspecified female breast: Secondary | ICD-10-CM

## 2013-04-11 DIAGNOSIS — C50912 Malignant neoplasm of unspecified site of left female breast: Secondary | ICD-10-CM

## 2013-04-11 LAB — CBC WITH DIFFERENTIAL/PLATELET
BASO%: 0.5 % (ref 0.0–2.0)
EOS%: 0.8 % (ref 0.0–7.0)
HCT: 36.9 % (ref 34.8–46.6)
MCH: 27.1 pg (ref 25.1–34.0)
MCHC: 33.7 g/dL (ref 31.5–36.0)
MONO#: 0.5 10*3/uL (ref 0.1–0.9)
NEUT%: 66.7 % (ref 38.4–76.8)
RBC: 4.59 10*6/uL (ref 3.70–5.45)
RDW: 13.8 % (ref 11.2–14.5)
WBC: 7.4 10*3/uL (ref 3.9–10.3)
lymph#: 1.9 10*3/uL (ref 0.9–3.3)

## 2013-04-11 LAB — VITAMIN D 25 HYDROXY (VIT D DEFICIENCY, FRACTURES): Vit D, 25-Hydroxy: 41 ng/mL (ref 30–89)

## 2013-04-11 MED ORDER — PROMETHAZINE HCL 25 MG PO TABS: 25.0000 mg | ORAL_TABLET | Freq: Four times a day (QID) | ORAL | Status: DC | PRN

## 2013-04-11 MED ORDER — DENOSUMAB 60 MG/ML ~~LOC~~ SOLN
60.0000 mg | Freq: Once | SUBCUTANEOUS | Status: AC
  Administered 2013-04-11: 60 mg via SUBCUTANEOUS
  Filled 2013-04-11: qty 1

## 2013-04-11 NOTE — Patient Instructions (Addendum)
Proceed with prolia today  You will return in 6 months for follow up and prolia  Call with any problems

## 2013-04-11 NOTE — Progress Notes (Signed)
OFFICE PROGRESS NOTE  CC** Dr. Chevis Pretty  DIAGNOSIS: 67 year old female with history of left breast lumpectomy and sentinel lymph node biopsy for a T1 C. N0 breast cancer.  PRIOR THERAPY:  #1 patient underwent a lumpectomy of the left breast that revealed a T1 C. N0 invasive mammary carcinoma all nodes were negative tumor was ER/PR positive.  #2 she then went on to have radiation therapy to the left breast followed by Femara 2.5 mg daily by Dr. Pierce Crane.  #3 patient recently was seen by Dr. Chevis Pretty with complaints of nipple discharge which has slowly improved.  CURRENT THERAPY: Femara 2.5 mg daily  INTERVAL HISTORY: Michele Hale 67 y.o. female returns for followup visit. Clinically she seems to be doing well now without any problems. She denies any fevers chills night sweats headaches no shortness of breath no nipple discharge. Patient also has osteopenia/osteoporosis and she is on prolia. Remainder of the 10 point review of systems is negative.  MEDICAL HISTORY: Past Medical History  Diagnosis Date  . Abscess   . IBS (irritable bowel syndrome)   . Breast mass   . Osteoporosis   . Cancer     breast - lt    ALLERGIES:  has No Known Allergies.  MEDICATIONS:  Current Outpatient Prescriptions  Medication Sig Dispense Refill  . acetaminophen (TYLENOL) 500 MG tablet Take 1,000 mg by mouth every 6 (six) hours as needed. For arthritis pain      . calcium-vitamin D (OSCAL WITH D) 500-200 MG-UNIT per tablet Take 1 tablet by mouth 2 (two) times daily.      . cholecalciferol (VITAMIN D) 1000 UNITS tablet Take 1 tablet (1,000 Units total) by mouth daily.  30 tablet  12  . letrozole (FEMARA) 2.5 MG tablet Take 1 tablet (2.5 mg total) by mouth daily.  90 tablet  12  . promethazine (PHENERGAN) 25 MG tablet Take 1 tablet (25 mg total) by mouth every 6 (six) hours as needed. For nausea  30 tablet  2  . traMADol (ULTRAM) 50 MG tablet Take 1 tablet (50 mg total) by mouth every 8  (eight) hours as needed for pain.  40 tablet  1  . diclofenac sodium (VOLTAREN) 1 % GEL Apply 2 g topically 4 (four) times daily.  1 Tube  10  . loratadine (CLARITIN) 10 MG tablet Take 1 tablet (10 mg total) by mouth daily.  30 tablet  3   No current facility-administered medications for this visit.    SURGICAL HISTORY:  Past Surgical History  Procedure Laterality Date  . Cesarean section    . Breast lumpectomy      lt breast    REVIEW OF SYSTEMS:  Pertinent items are noted in HPI.   HEALTH MAINTENANCE:   PHYSICAL EXAMINATION: Blood pressure 167/74, pulse 79, temperature 98.4 F (36.9 C), temperature source Oral, resp. rate 20, height 5\' 3"  (1.6 m), weight 144 lb 14.4 oz (65.726 kg). Body mass index is 25.67 kg/(m^2). ECOG PERFORMANCE STATUS: 1 - Symptomatic but completely ambulatory   General appearance: alert and cooperative Lymph nodes: Cervical, supraclavicular, and axillary nodes normal. Resp: clear to auscultation bilaterally Cardio: regular rate and rhythm GI: soft, non-tender; bowel sounds normal; no masses,  no organomegaly Extremities: extremities normal, atraumatic, no cyanosis or edema Neurologic: Grossly normal Left breast well-healed surgical scar there is no nipple discharge no tenderness. Right breast no masses or nipple discharge.  LABORATORY DATA: Lab Results  Component Value Date   WBC 7.4 04/11/2013  HGB 12.4 04/11/2013   HCT 36.9 04/11/2013   MCV 80.4 04/11/2013   PLT 272 04/11/2013      Chemistry      Component Value Date/Time   NA 143 01/23/2013 1420   NA 139 02/07/2012 1312   K 4.1 01/23/2013 1420   K 4.2 02/07/2012 1312   CL 104 01/23/2013 1420   CL 103 02/07/2012 1312   CO2 30* 01/23/2013 1420   CO2 26 02/07/2012 1312   BUN 17.4 01/23/2013 1420   BUN 13 02/07/2012 1312   CREATININE 0.7 01/23/2013 1420   CREATININE 0.73 02/07/2012 1312      Component Value Date/Time   CALCIUM 9.7 01/23/2013 1420   CALCIUM 9.8 02/07/2012 1312   ALKPHOS 61 01/23/2013 1420    ALKPHOS 63 02/07/2012 1312   AST 14 01/23/2013 1420   AST 17 02/07/2012 1312   ALT 11 01/23/2013 1420   ALT 15 02/07/2012 1312   BILITOT 0.20 01/23/2013 1420   BILITOT 0.3 02/07/2012 1312       RADIOGRAPHIC STUDIES:  No results found.  ASSESSMENT: 67 year old female with  #1 stage I invasive mammary carcinoma of the left breast status post lumpectomy followed by radiation now on Femara 2.5 mg daily tolerating it well. Nipple discharge has improved.  #2 osteoporosis/osteopenia on prolia. She will receive prolia today   PLAN:   #1 continue Femara.  #2 proceed with prolia every 6 months, risks and benefits of this were discussed with the patient including osteonecrosis of the jaw complications.  #3 she will be seen back in 6 months time.   All questions were answered. The patient knows to call the clinic with any problems, questions or concerns. We can certainly see the patient much sooner if necessary.  I spent 25 minutes counseling the patient face to face. The total time spent in the appointment was 30 minutes.    Drue Second, MD Medical/Oncology [facility] [phone] (beeper) [phone] (Office)

## 2013-04-11 NOTE — Patient Instructions (Addendum)
Denosumab injection What is this medicine? DENOSUMAB slows bone breakdown. It is used to treat osteoporosis in women after menopause and in men. This medicine is also used to prevent bone fractures and other bone problems caused by cancer bone metastases. This medicine may be used for other purposes; ask your health care provider or pharmacist if you have questions. What should I tell my health care provider before I take this medicine? They need to know if you have any of these conditions: -dental disease -eczema -infection or history of infections -kidney disease or on dialysis -low blood calcium or vitamin D -malabsorption syndrome -scheduled to have surgery or tooth extraction -taking medicine that contains denosumab -thyroid or parathyroid disease -an unusual reaction to denosumab, other medicines, foods, dyes, or preservatives -pregnant or trying to get pregnant -breast-feeding How should I use this medicine? This medicine is for injection under the skin. It is given by a health care professional in a hospital or clinic setting. If you are getting Prolia, a special MedGuide will be given to you by the pharmacist with each prescription and refill. Be sure to read this information carefully each time. Talk to your pediatrician regarding the use of this medicine in children. Special care may be needed. Overdosage: If you think you've taken too much of this medicine contact a poison control center or emergency room at once. Overdosage: If you think you have taken too much of this medicine contact a poison control center or emergency room at once. NOTE: This medicine is only for you. Do not share this medicine with others. What if I miss a dose? It is important not to miss your dose. Call your doctor or health care professional if you are unable to keep an appointment. What may interact with this medicine? Do not take this medicine with any of the following medications: -other medicines  containing denosumab This medicine may also interact with the following medications: -medicines that suppress the immune system -medicines that treat cancer -steroid medicines like prednisone or cortisone This list may not describe all possible interactions. Give your health care provider a list of all the medicines, herbs, non-prescription drugs, or dietary supplements you use. Also tell them if you smoke, drink alcohol, or use illegal drugs. Some items may interact with your medicine. What should I watch for while using this medicine? Visit your doctor or health care professional for regular checks on your progress. Your doctor or health care professional may order blood tests and other tests to see how you are doing. Call your doctor or health care professional if you get a cold or other infection while receiving this medicine. Do not treat yourself. This medicine may decrease your body's ability to fight infection. You should make sure you get enough calcium and vitamin D while you are taking this medicine, unless your doctor tells you not to. Discuss the foods you eat and the vitamins you take with your health care professional. See your dentist regularly. Brush and floss your teeth as directed. Before you have any dental work done, tell your dentist you are receiving this medicine. What side effects may I notice from receiving this medicine? Side effects that you should report to your doctor or health care professional as soon as possible: -allergic reactions like skin rash, itching or hives, swelling of the face, lips, or tongue -breathing problems -chest pain -fast, irregular heartbeat -feeling faint or lightheaded, falls -fever, chills, or any other sign of infection -muscle spasms, tightening, or twitches -numbness   or tingling -skin blisters or bumps, or is dry, peels, or red -slow healing or unexplained pain in the mouth or jaw -unusual bleeding or bruising Side effects that  usually do not require medical attention (Report these to your doctor or health care professional if they continue or are bothersome.): -muscle pain -stomach upset, gas This list may not describe all possible side effects. Call your doctor for medical advice about side effects. You may report side effects to FDA at 1-800-FDA-1088. Where should I keep my medicine? This medicine is only given in a clinic, doctor's office, or other health care setting and will not be stored at home. NOTE: This sheet is a summary. It may not cover all possible information. If you have questions about this medicine, talk to your doctor, pharmacist, or health care provider.  2013, [employer]. (07/20/2011 3:40:41 PM)  

## 2013-06-22 ENCOUNTER — Telehealth: Payer: Self-pay | Admitting: *Deleted

## 2013-06-22 NOTE — Telephone Encounter (Signed)
i will mail the pt a letter/avs showing that on 10/10/13 she needs to come in the am instead of the pm due to kk will be in [facility]. gv appt for 10/10/13 labs@ 11:45am, ov@12 :15p and inj@ 1pm....TD

## 2013-10-08 ENCOUNTER — Other Ambulatory Visit: Payer: Self-pay | Admitting: Adult Health

## 2013-10-10 ENCOUNTER — Ambulatory Visit: Payer: Self-pay

## 2013-10-10 ENCOUNTER — Ambulatory Visit: Payer: Self-pay | Admitting: Oncology

## 2013-10-10 ENCOUNTER — Other Ambulatory Visit: Payer: Self-pay | Admitting: Lab

## 2013-10-10 ENCOUNTER — Encounter (INDEPENDENT_AMBULATORY_CARE_PROVIDER_SITE_OTHER): Payer: Self-pay | Admitting: General Surgery

## 2013-10-10 ENCOUNTER — Other Ambulatory Visit: Payer: Self-pay

## 2013-10-12 ENCOUNTER — Other Ambulatory Visit: Payer: Self-pay | Admitting: Emergency Medicine

## 2013-10-12 ENCOUNTER — Telehealth: Payer: Self-pay | Admitting: Oncology

## 2013-10-12 DIAGNOSIS — C50912 Malignant neoplasm of unspecified site of left female breast: Secondary | ICD-10-CM

## 2013-10-12 DIAGNOSIS — C50919 Malignant neoplasm of unspecified site of unspecified female breast: Secondary | ICD-10-CM

## 2013-10-12 MED ORDER — LETROZOLE 2.5 MG PO TABS: 2.5000 mg | ORAL_TABLET | Freq: Every day | ORAL | Status: DC

## 2013-10-15 ENCOUNTER — Other Ambulatory Visit: Payer: Self-pay | Admitting: Emergency Medicine

## 2013-10-15 DIAGNOSIS — C50912 Malignant neoplasm of unspecified site of left female breast: Secondary | ICD-10-CM

## 2013-10-15 DIAGNOSIS — C50919 Malignant neoplasm of unspecified site of unspecified female breast: Secondary | ICD-10-CM

## 2013-10-15 MED ORDER — LETROZOLE 2.5 MG PO TABS: 2.5000 mg | ORAL_TABLET | Freq: Every day | ORAL | Status: DC

## 2013-11-30 ENCOUNTER — Telehealth: Payer: Self-pay | Admitting: *Deleted

## 2013-11-30 ENCOUNTER — Ambulatory Visit (HOSPITAL_BASED_OUTPATIENT_CLINIC_OR_DEPARTMENT_OTHER): Payer: Self-pay | Admitting: Hematology and Oncology

## 2013-11-30 ENCOUNTER — Other Ambulatory Visit (HOSPITAL_BASED_OUTPATIENT_CLINIC_OR_DEPARTMENT_OTHER): Payer: Self-pay

## 2013-11-30 ENCOUNTER — Ambulatory Visit (HOSPITAL_BASED_OUTPATIENT_CLINIC_OR_DEPARTMENT_OTHER): Payer: Self-pay

## 2013-11-30 ENCOUNTER — Ambulatory Visit: Payer: Self-pay

## 2013-11-30 VITALS — BP 151/76 | HR 83 | Temp 98.4°F | Resp 18 | Wt 150.7 lb

## 2013-11-30 DIAGNOSIS — C50519 Malignant neoplasm of lower-outer quadrant of unspecified female breast: Secondary | ICD-10-CM

## 2013-11-30 DIAGNOSIS — M899 Disorder of bone, unspecified: Secondary | ICD-10-CM

## 2013-11-30 DIAGNOSIS — M810 Age-related osteoporosis without current pathological fracture: Secondary | ICD-10-CM

## 2013-11-30 DIAGNOSIS — D72819 Decreased white blood cell count, unspecified: Secondary | ICD-10-CM

## 2013-11-30 DIAGNOSIS — Z170 Estrogen receptor positive status [ER+]: Secondary | ICD-10-CM

## 2013-11-30 DIAGNOSIS — D709 Neutropenia, unspecified: Secondary | ICD-10-CM

## 2013-11-30 DIAGNOSIS — C50912 Malignant neoplasm of unspecified site of left female breast: Secondary | ICD-10-CM

## 2013-11-30 DIAGNOSIS — M949 Disorder of cartilage, unspecified: Secondary | ICD-10-CM

## 2013-11-30 DIAGNOSIS — C50919 Malignant neoplasm of unspecified site of unspecified female breast: Secondary | ICD-10-CM

## 2013-11-30 LAB — COMPREHENSIVE METABOLIC PANEL (CC13)
ALT: 11 U/L (ref 0–55)
ANION GAP: 8 meq/L (ref 3–11)
AST: 13 U/L (ref 5–34)
Albumin: 4 g/dL (ref 3.5–5.0)
Alkaline Phosphatase: 69 U/L (ref 40–150)
BILIRUBIN TOTAL: 0.39 mg/dL (ref 0.20–1.20)
BUN: 14.9 mg/dL (ref 7.0–26.0)
CALCIUM: 9.6 mg/dL (ref 8.4–10.4)
CO2: 25 meq/L (ref 22–29)
CREATININE: 0.7 mg/dL (ref 0.6–1.1)
Chloride: 106 mEq/L (ref 98–109)
GLUCOSE: 154 mg/dL — AB (ref 70–140)
Potassium: 3.8 mEq/L (ref 3.5–5.1)
Sodium: 140 mEq/L (ref 136–145)
Total Protein: 7.2 g/dL (ref 6.4–8.3)

## 2013-11-30 LAB — CBC WITH DIFFERENTIAL/PLATELET
BASO%: 1.2 % (ref 0.0–2.0)
BASOS ABS: 0 10*3/uL (ref 0.0–0.1)
EOS%: 3 % (ref 0.0–7.0)
Eosinophils Absolute: 0.1 10*3/uL (ref 0.0–0.5)
HEMATOCRIT: 38.9 % (ref 34.8–46.6)
HEMOGLOBIN: 13 g/dL (ref 11.6–15.9)
LYMPH%: 40 % (ref 14.0–49.7)
MCH: 27 pg (ref 25.1–34.0)
MCHC: 33.5 g/dL (ref 31.5–36.0)
MCV: 80.5 fL (ref 79.5–101.0)
MONO#: 0.3 10*3/uL (ref 0.1–0.9)
MONO%: 9 % (ref 0.0–14.0)
NEUT#: 1.7 10*3/uL (ref 1.5–6.5)
NEUT%: 46.8 % (ref 38.4–76.8)
PLATELETS: 254 10*3/uL (ref 145–400)
RBC: 4.83 10*6/uL (ref 3.70–5.45)
RDW: 13.3 % (ref 11.2–14.5)
WBC: 3.6 10*3/uL — AB (ref 3.9–10.3)
lymph#: 1.4 10*3/uL (ref 0.9–3.3)

## 2013-11-30 MED ORDER — DENOSUMAB 60 MG/ML ~~LOC~~ SOLN
60.0000 mg | Freq: Once | SUBCUTANEOUS | Status: AC
  Administered 2013-11-30: 60 mg via SUBCUTANEOUS
  Filled 2013-11-30: qty 1

## 2013-11-30 MED ORDER — TRAMADOL HCL 50 MG PO TABS: 50.0000 mg | ORAL_TABLET | Freq: Three times a day (TID) | ORAL | Status: DC | PRN

## 2013-11-30 NOTE — Progress Notes (Signed)
OFFICE PROGRESS NOTE  CC: Breast cancer follow up  Dr. Autumn Messing  DIAGNOSIS: 68 year old female with history of left breast lumpectomy and sentinel lymph node biopsy for a  T1 C. N0 breast cancer.  PRIOR THERAPY:  #1 patient underwent a lumpectomy of the left breast that revealed a T1 C. N0 invasive mammary carcinoma all nodes were negative tumor was ER/PR positive.  #2 she then went on to have radiation therapy to the left breast followed by Femara 2.5 mg daily by Dr. Eston Esters.   CURRENT THERAPY: Femara 2.5 mg daily  INTERVAL HISTORY: Michele Hale 68 y.o. female returns for followup visit. She is accompanied her son and Spanish interpreter.  She is on Femara 2.5 mg by mouth once daily .  she does complain of minimal knee pains. She complains of persistent nausea for that she takes Phenergan as needed. She denies any fevers chills night sweats headaches no shortness of breath no nipple discharge. She denies any blurred vision, headaches, constipation, blood in the stool blood in the urine. She denies any vomiting. She denies any weight loss or decrease in appetite. She says she missed her mammogram appointment she is due to get the mammogram  last month  She takes calcium with  vitamin D supplementation for osteopenia/osteoporosis and she is supposed to be on prolia. Because of insurance issues she was not able to get it in December 2014.  MEDICAL HISTORY: Past Medical History  Diagnosis Date  . Abscess   . IBS (irritable bowel syndrome)   . Breast mass   . Osteoporosis   . Cancer     breast - lt    ALLERGIES:  has No Known Allergies.  MEDICATIONS:  Current Outpatient Prescriptions  Medication Sig Dispense Refill  . acetaminophen (TYLENOL) 500 MG tablet Take 1,000 mg by mouth every 6 (six) hours as needed. For arthritis pain      . calcium-vitamin D (OSCAL WITH D) 500-200 MG-UNIT per tablet Take 1 tablet by mouth 2 (two) times daily.      . cholecalciferol (VITAMIN  D) 1000 UNITS tablet Take 1 tablet (1,000 Units total) by mouth daily.  30 tablet  12  . diclofenac sodium (VOLTAREN) 1 % GEL Apply 2 g topically 4 (four) times daily.  1 Tube  10  . letrozole (FEMARA) 2.5 MG tablet Take 1 tablet (2.5 mg total) by mouth daily.  180 tablet  1  . promethazine (PHENERGAN) 25 MG tablet Take 1 tablet (25 mg total) by mouth every 6 (six) hours as needed. For nausea  90 tablet  2  . loratadine (CLARITIN) 10 MG tablet Take 1 tablet (10 mg total) by mouth daily.  30 tablet  3  . traMADol (ULTRAM) 50 MG tablet Take 1 tablet (50 mg total) by mouth every 8 (eight) hours as needed.  20 tablet  1   No current facility-administered medications for this visit.    SURGICAL HISTORY:  Past Surgical History  Procedure Laterality Date  . Cesarean section    . Breast lumpectomy      lt breast    REVIEW OF SYSTEMS:  Pertinent items are noted in HPI.   HEALTH MAINTENANCE:   PHYSICAL EXAMINATION: Blood pressure 151/76, pulse 83, temperature 98.4 F (36.9 C), temperature source Oral, resp. rate 18, weight 150 lb 11.2 oz (68.357 kg). Body mass index is 26.7 kg/(m^2). ECOG PERFORMANCE STATUS: 1 - Symptomatic but completely ambulatory HEENT PERRLA, sclerae anicteric, conjunctiva no pallor Oropharynx moist mucous membranes  General appearance: alert and cooperative Lymph nodes: Cervical, supraclavicular, and axillary nodes normal. Resp: clear to auscultation bilaterally Cardio: regular rate and rhythm GI: soft, non-tender; bowel sounds normal; no masses,  no organomegaly Extremities: extremities normal, atraumatic, no cyanosis or edema Neurologic: Grossly normal Left breast well-healed surgical scar there is no nipple discharge no tenderness.  Right breast no masses or nipple discharge.  LABORATORY DATA: Lab Results  Component Value Date   WBC 3.6* 11/30/2013   HGB 13.0 11/30/2013   HCT 38.9 11/30/2013   MCV 80.5 11/30/2013   PLT 254 11/30/2013      Chemistry       Component Value Date/Time   NA 140 11/30/2013 0948   NA 139 02/07/2012 1312   K 3.8 11/30/2013 0948   K 4.2 02/07/2012 1312   CL 104 01/23/2013 1420   CL 103 02/07/2012 1312   CO2 25 11/30/2013 0948   CO2 26 02/07/2012 1312   BUN 14.9 11/30/2013 0948   BUN 13 02/07/2012 1312   CREATININE 0.7 11/30/2013 0948   CREATININE 0.73 02/07/2012 1312      Component Value Date/Time   CALCIUM 9.6 11/30/2013 0948   CALCIUM 9.8 02/07/2012 1312   ALKPHOS 69 11/30/2013 0948   ALKPHOS 63 02/07/2012 1312   AST 13 11/30/2013 0948   AST 17 02/07/2012 1312   ALT 11 11/30/2013 0948   ALT 15 02/07/2012 1312   BILITOT 0.39 11/30/2013 0948   BILITOT 0.3 02/07/2012 1312       RADIOGRAPHIC STUDIES:  No results found.  ASSESSMENT: 68 year old female with  #1 stage I invasive mammary carcinoma of the left breast status post lumpectomy followed by radiation now on Femara   #2 osteoporosis/osteopenia on calcium with vitamin D supplementation  #3 leukopenia with neutropenia most likely secondary to medication induced(NSAIDS)  PLAN:   #1 Continue Femara 2.5 mg po once daily as scheduled. Patient is tolerating Femara well with minimal joint pains. She does not complain of any hot flashes  #2 we'll arrange for  annual digital diagnostic mammogram and sonogram of the left breast  #3 in view of insurance issues she did not receive prolia in December 2014. I have referred the patient to financial advocate and we'll arrange for prolia  to be given today . Risks and benefits of prolia were discussed with the patient including osteonecrosis of the jaw complications.  #4 next  DEXA scan in July 2015  #5 instructed the patient to decrease NSAID intake in view of Leukopenia/neutropenia and asked her to F/u With her PCP ASAP for adjustment of pain meds  #6 follow up with primary care physician for persistent nausea for further evaluation and possible referral to GI  #7 next Prolia injection in 6 months  #8 next followup visit in 4-6  months with CBC, CMP  All questions were answered. The patient knows to call the clinic with any problems  or concerns. We can certainly see the patient much sooner if necessary.  I spent 20 minutes counseling the patient face to face. The total time spent in the appointment was 30 minutes.   Wilmon Arms, MD Medical/Oncology [facility]

## 2013-11-30 NOTE — Telephone Encounter (Signed)
appts made and printed . Made pt aware that i sent this message to [place] : Dr. Humphrey Rolls per pof that covering provider 2 sent pt needs a mammo and [place]. However, the pt has no health coverage per Helene Kelp @ the [facility] stated that Altha Harm would her to examine and schedule the pt. Sw Altha Harm pt is over age for her program and for the [facility] program. Please advise me. Pt is aware that once she advise me i will give them a call...td

## 2013-12-10 ENCOUNTER — Ambulatory Visit: Payer: Self-pay | Admitting: Internal Medicine

## 2013-12-17 ENCOUNTER — Ambulatory Visit: Payer: Self-pay | Admitting: Internal Medicine

## 2013-12-25 ENCOUNTER — Encounter: Payer: Self-pay | Admitting: Internal Medicine

## 2013-12-25 ENCOUNTER — Ambulatory Visit: Payer: Self-pay | Attending: Internal Medicine | Admitting: Internal Medicine

## 2013-12-25 VITALS — BP 141/79 | HR 70 | Temp 98.7°F | Resp 15

## 2013-12-25 DIAGNOSIS — Z09 Encounter for follow-up examination after completed treatment for conditions other than malignant neoplasm: Secondary | ICD-10-CM | POA: Insufficient documentation

## 2013-12-25 DIAGNOSIS — M62838 Other muscle spasm: Secondary | ICD-10-CM | POA: Insufficient documentation

## 2013-12-25 DIAGNOSIS — M25562 Pain in left knee: Secondary | ICD-10-CM

## 2013-12-25 DIAGNOSIS — Z853 Personal history of malignant neoplasm of breast: Secondary | ICD-10-CM | POA: Insufficient documentation

## 2013-12-25 DIAGNOSIS — M25561 Pain in right knee: Secondary | ICD-10-CM

## 2013-12-25 DIAGNOSIS — M25519 Pain in unspecified shoulder: Secondary | ICD-10-CM | POA: Insufficient documentation

## 2013-12-25 DIAGNOSIS — Z79899 Other long term (current) drug therapy: Secondary | ICD-10-CM | POA: Insufficient documentation

## 2013-12-25 DIAGNOSIS — Z9109 Other allergy status, other than to drugs and biological substances: Secondary | ICD-10-CM

## 2013-12-25 DIAGNOSIS — M25569 Pain in unspecified knee: Secondary | ICD-10-CM | POA: Insufficient documentation

## 2013-12-25 DIAGNOSIS — M810 Age-related osteoporosis without current pathological fracture: Secondary | ICD-10-CM | POA: Insufficient documentation

## 2013-12-25 MED ORDER — LORATADINE 10 MG PO TABS: 10.0000 mg | ORAL_TABLET | Freq: Every day | ORAL | Status: AC

## 2013-12-25 MED ORDER — CYCLOBENZAPRINE HCL 10 MG PO TABS: 10.0000 mg | ORAL_TABLET | Freq: Every day | ORAL | Status: DC

## 2013-12-25 NOTE — Progress Notes (Signed)
Patient here with son Complains of left knee pain Right leg pain Bilateral shoulder pain

## 2013-12-25 NOTE — Progress Notes (Signed)
MRN: [id number] Name: Michele Hale  Sex: female Age: 68 y.o. DOB: 1946/03/02  Allergies: Review of patient's allergies indicates no known allergies.  Chief Complaint  Patient presents with  . Follow-up    HPI: Patient is 68 y.o. female who comes today reported to have ongoing shoulder and knee pain for the last several months, she's already taking tramadol and is applying Voltaren gel, she had x-ray done in the past reported to have osteoarthritis, denies any fever chills, she also reported to have right leg pain sometimes which starts from lower back, denies any incontinence. Patient also reported to have lot of allergies, was taking Claritin in the past. Patient denies any cold symptoms.  Past Medical History  Diagnosis Date  . Abscess   . IBS (irritable bowel syndrome)   . Breast mass   . Osteoporosis   . Cancer     breast - lt    Past Surgical History  Procedure Laterality Date  . Cesarean section    . Breast lumpectomy      lt breast      Medication List       This list is accurate as of: 12/25/13  3:10 PM.  Always use your most recent med list.               acetaminophen 500 MG tablet  Commonly known as:  TYLENOL  Take 1,000 mg by mouth every 6 (six) hours as needed. For arthritis pain     calcium-vitamin D 500-200 MG-UNIT per tablet  Commonly known as:  OSCAL WITH D  Take 1 tablet by mouth 2 (two) times daily.     cholecalciferol 1000 UNITS tablet  Commonly known as:  VITAMIN D  Take 1 tablet (1,000 Units total) by mouth daily.     cyclobenzaprine 10 MG tablet  Commonly known as:  FLEXERIL  Take 1 tablet (10 mg total) by mouth at bedtime.     diclofenac sodium 1 % Gel  Commonly known as:  VOLTAREN  Apply 2 g topically 4 (four) times daily.     letrozole 2.5 MG tablet  Commonly known as:  FEMARA  Take 1 tablet (2.5 mg total) by mouth daily.     loratadine 10 MG tablet  Commonly known as:  CLARITIN  Take 1 tablet (10 mg total) by mouth  daily.     promethazine 25 MG tablet  Commonly known as:  PHENERGAN  Take 1 tablet (25 mg total) by mouth every 6 (six) hours as needed. For nausea     traMADol 50 MG tablet  Commonly known as:  ULTRAM  Take 1 tablet (50 mg total) by mouth every 8 (eight) hours as needed.        Meds ordered this encounter  Medications  . cyclobenzaprine (FLEXERIL) 10 MG tablet    Sig: Take 1 tablet (10 mg total) by mouth at bedtime.    Dispense:  30 tablet    Refill:  2  . loratadine (CLARITIN) 10 MG tablet    Sig: Take 1 tablet (10 mg total) by mouth daily.    Dispense:  30 tablet    Refill:  3     There is no immunization history on file for this patient.  Family History  Problem Relation Age of Onset  . Cancer Father     liver  . Cancer Mother     liver    History  Substance Use Topics  . Smoking status: Never Smoker   .  Smokeless tobacco: Not on file  . Alcohol Use: No    Review of Systems   As noted in HPI  Filed Vitals:   12/25/13 1451  BP: 141/79  Pulse: 70  Temp: 98.7 F (37.1 C)  Resp: 15    Physical Exam  Physical Exam  Constitutional: She is oriented to person, place, and time.  Elderly female sitting comfortably not in acute distress  Cardiovascular: Normal rate and regular rhythm.   Pulmonary/Chest: Breath sounds normal. No respiratory distress. She has no wheezes. She has no rales.  Musculoskeletal:  Both shoulders tenderness anteriorly some limitation with full range of motion secondary to pain  Bilateral knees crepitation positive minimal tenderness no erythema or warmth.  Neurological: She is alert and oriented to person, place, and time.    CBC    Component Value Date/Time   WBC 3.6* 11/30/2013 0948   WBC 4.1 11/05/2010 1401   RBC 4.83 11/30/2013 0948   RBC 4.56 11/05/2010 1401   HGB 13.0 11/30/2013 0948   HGB 12.0 11/05/2010 1401   HCT 38.9 11/30/2013 0948   HCT 36.3 11/05/2010 1401   PLT 254 11/30/2013 0948   PLT 287 11/05/2010 1401   MCV 80.5  11/30/2013 0948   MCV 79.6 11/05/2010 1401   LYMPHSABS 1.4 11/30/2013 0948   LYMPHSABS 1.9 11/05/2010 1401   MONOABS 0.3 11/30/2013 0948   MONOABS 0.3 11/05/2010 1401   EOSABS 0.1 11/30/2013 0948   EOSABS 0.1 11/05/2010 1401   BASOSABS 0.0 11/30/2013 0948   BASOSABS 0.0 11/05/2010 1401    CMP     Component Value Date/Time   NA 140 11/30/2013 0948   NA 139 02/07/2012 1312   K 3.8 11/30/2013 0948   K 4.2 02/07/2012 1312   CL 104 01/23/2013 1420   CL 103 02/07/2012 1312   CO2 25 11/30/2013 0948   CO2 26 02/07/2012 1312   GLUCOSE 154* 11/30/2013 0948   GLUCOSE 150* 01/23/2013 1420   GLUCOSE 117* 02/07/2012 1312   BUN 14.9 11/30/2013 0948   BUN 13 02/07/2012 1312   CREATININE 0.7 11/30/2013 0948   CREATININE 0.73 02/07/2012 1312   CALCIUM 9.6 11/30/2013 0948   CALCIUM 9.8 02/07/2012 1312   PROT 7.2 11/30/2013 0948   PROT 7.5 02/07/2012 1312   ALBUMIN 4.0 11/30/2013 0948   ALBUMIN 4.3 02/07/2012 1312   AST 13 11/30/2013 0948   AST 17 02/07/2012 1312   ALT 11 11/30/2013 0948   ALT 15 02/07/2012 1312   ALKPHOS 69 11/30/2013 0948   ALKPHOS 63 02/07/2012 1312   BILITOT 0.39 11/30/2013 0948   BILITOT 0.3 02/07/2012 1312   GFRNONAA >60 11/05/2010 1401   GFRAA  Value: >60        The eGFR has been calculated using the MDRD equation. This calculation has not been validated in all clinical situations. eGFR's persistently <60 mL/min signify possible Chronic Kidney Disease. 11/05/2010 1401    No results found for this basename: chol, tri, ldl    No components found with this basename: hga1c    Lab Results  Component Value Date/Time   AST 13 11/30/2013  9:48 AM   AST 17 02/07/2012  1:12 PM    Assessment and Plan  Knee pain, bilateral - Plan: Continue with her tramadol, Voltaren gel, also Ambulatory referral to Orthopedic Surgery  Pain in joint, shoulder region - Plan: Ambulatory referral to Orthopedic Surgery  Muscle spasm - Plan: cyclobenzaprine (FLEXERIL) 10 MG tablet  Environmental allergies - Plan: loratadine (  CLARITIN) 10 MG  tablet    Return in about 4 months (around 04/26/2014) for osteoarthritis.  Lorayne Marek, MD

## 2013-12-26 ENCOUNTER — Ambulatory Visit (INDEPENDENT_AMBULATORY_CARE_PROVIDER_SITE_OTHER): Payer: Self-pay | Admitting: Family Medicine

## 2013-12-26 ENCOUNTER — Encounter: Payer: Self-pay | Admitting: Family Medicine

## 2013-12-26 VITALS — BP 123/76 | HR 90 | Ht <= 58 in | Wt 150.0 lb

## 2013-12-26 DIAGNOSIS — M5430 Sciatica, unspecified side: Secondary | ICD-10-CM

## 2013-12-26 DIAGNOSIS — M25569 Pain in unspecified knee: Secondary | ICD-10-CM

## 2013-12-26 DIAGNOSIS — M79609 Pain in unspecified limb: Secondary | ICD-10-CM

## 2013-12-26 DIAGNOSIS — M79604 Pain in right leg: Secondary | ICD-10-CM

## 2013-12-26 NOTE — Patient Instructions (Signed)
You have sciatica causing the pain into your right leg. Try to avoid painful activities as much as possible. Start physical therapy and home exercises. Tennis ball to massage area when sitting  For your left knee arthritis: Take tylenol 500mg  1-2 tabs three times a day for pain. Aleve 1-2 tabs twice a day with food Glucosamine sulfate 750mg  twice a day is a supplement that may help. Capsaicin topically up to four times a day may also help with pain. Cortisone injections are an option - let me know if you want to do one of these. It's important that you continue to stay active. Straight leg raises, knee extensions 3 sets of 10 once a day (add ankle weight if these become too easy). Consider physical therapy to strengthen muscles around the joint that hurts to take pressure off of the joint itself. Shoe inserts with good arch support may be helpful. Heat or ice 15 minutes at a time 3-4 times a day as needed to help with pain. Water aerobics and cycling with low resistance are the best two types of exercise for arthritis. Follow up with me in 4-6 weeks for reevaluation.

## 2014-01-02 ENCOUNTER — Ambulatory Visit: Payer: No Typology Code available for payment source | Admitting: Physical Therapy

## 2014-01-02 ENCOUNTER — Encounter: Payer: Self-pay | Admitting: Family Medicine

## 2014-01-02 ENCOUNTER — Ambulatory Visit: Payer: No Typology Code available for payment source | Attending: Internal Medicine

## 2014-01-02 DIAGNOSIS — M79604 Pain in right leg: Secondary | ICD-10-CM | POA: Insufficient documentation

## 2014-01-02 NOTE — Progress Notes (Signed)
Patient ID: Michele Hale, female   DOB: 06/05/1946, 68 y.o.   MRN: [id number]  PCP: Irwin Brakeman, MD  Subjective:   HPI: Patient is a 68 y.o. female here for left knee and right leg pain.  Patient reports for past few years she's had left knee pain. Has not tried anything other than pain medicine, anti-inflammatories for this. Had x-rays on 1/5 showing DJD. Swelling at times. No catching, locking, giving out. Pain diffuse and anterior.  Also having right leg pain radiating from buttock down leg. Some associated numbness as well. No acute injury here (fell in December but hit clavicle on right side). No bowel/bladder dysfunction. No symptoms into left leg.  Past Medical History  Diagnosis Date  . Abscess   . IBS (irritable bowel syndrome)   . Breast mass   . Osteoporosis   . Cancer     breast - lt    Current Outpatient Prescriptions on File Prior to Visit  Medication Sig Dispense Refill  . acetaminophen (TYLENOL) 500 MG tablet Take 1,000 mg by mouth every 6 (six) hours as needed. For arthritis pain      . calcium-vitamin D (OSCAL WITH D) 500-200 MG-UNIT per tablet Take 1 tablet by mouth 2 (two) times daily.      . cholecalciferol (VITAMIN D) 1000 UNITS tablet Take 1 tablet (1,000 Units total) by mouth daily.  30 tablet  12  . cyclobenzaprine (FLEXERIL) 10 MG tablet Take 1 tablet (10 mg total) by mouth at bedtime.  30 tablet  2  . diclofenac sodium (VOLTAREN) 1 % GEL Apply 2 g topically 4 (four) times daily.  1 Tube  10  . letrozole (FEMARA) 2.5 MG tablet Take 1 tablet (2.5 mg total) by mouth daily.  180 tablet  1  . loratadine (CLARITIN) 10 MG tablet Take 1 tablet (10 mg total) by mouth daily.  30 tablet  3  . promethazine (PHENERGAN) 25 MG tablet Take 1 tablet (25 mg total) by mouth every 6 (six) hours as needed. For nausea  90 tablet  2  . traMADol (ULTRAM) 50 MG tablet Take 1 tablet (50 mg total) by mouth every 8 (eight) hours as needed.  20 tablet  1   No current  facility-administered medications on file prior to visit.    Past Surgical History  Procedure Laterality Date  . Cesarean section    . Breast lumpectomy      lt breast    No Known Allergies  History   Social History  . Marital Status: Single    Spouse Name: N/A    Number of Children: N/A  . Years of Education: N/A   Occupational History  . Not on file.   Social History Main Topics  . Smoking status: Never Smoker   . Smokeless tobacco: Not on file  . Alcohol Use: No  . Drug Use: No  . Sexual Activity: Not Currently   Other Topics Concern  . Not on file   Social History Narrative  . No narrative on file    Family History  Problem Relation Age of Onset  . Cancer Father     liver  . Cancer Mother     liver    BP 123/76  Pulse 90  Ht 4\' 9"  (1.448 m)  Wt 150 lb (68.04 kg)  BMI 32.45 kg/m2  Review of Systems: See HPI above.    Objective:  Physical Exam:  Gen: NAD  Left knee: No gross deformity, ecchymoses, effusion.  +  crepitation. TTP medial > lateral joint lines, post patellar facets.  No other tenderness. FROM. Negative ant/post drawers. Negative valgus/varus testing. Negative lachmanns. Negative mcmurrays, apleys, patellar apprehension. NV intact distally.  Back/R hip: No gross deformity, scoliosis. TTP piriformis.  No midline or bony TTP.  No paraspinal tenderness. FROM without pain. Strength LEs 5/5 all muscle groups except 4/5 with abduction.   2+ MSRs in patellar and achilles tendons, equal bilaterally. Negative SLRs. Sensation intact to light touch bilaterally. Negative logroll bilateral hips Mild pain right piriformis.  Negative left and negative fabers.    Assessment & Plan:  1. Left knee pain - 2/2 DJD.  Discussed OTC measures - tylenol, nsaids, glucosamine, capsaicin.  Consider injection.  Home exercises.  Consider physical therapy.  F/u in 6 weeks or prn.  2. Right leg pain - 2/2 sciatica, piriformis syndrome.  Will go ahead with  physical therapy to learn stretches and strengthening exercises.  F/u 6 weeks or prn.

## 2014-01-02 NOTE — Assessment & Plan Note (Signed)
2/2 DJD.  Discussed OTC measures - tylenol, nsaids, glucosamine, capsaicin.  Consider injection.  Home exercises.  Consider physical therapy.  F/u in 6 weeks or prn.

## 2014-01-02 NOTE — Assessment & Plan Note (Signed)
2/2 sciatica, piriformis syndrome.  Will go ahead with physical therapy to learn stretches and strengthening exercises.  F/u 6 weeks or prn.

## 2014-01-07 ENCOUNTER — Ambulatory Visit: Payer: No Typology Code available for payment source | Attending: Family Medicine | Admitting: Physical Therapy

## 2014-01-07 DIAGNOSIS — M545 Low back pain, unspecified: Secondary | ICD-10-CM | POA: Insufficient documentation

## 2014-01-07 DIAGNOSIS — M25569 Pain in unspecified knee: Secondary | ICD-10-CM | POA: Insufficient documentation

## 2014-01-07 DIAGNOSIS — IMO0001 Reserved for inherently not codable concepts without codable children: Secondary | ICD-10-CM | POA: Insufficient documentation

## 2014-01-09 ENCOUNTER — Ambulatory Visit: Payer: No Typology Code available for payment source | Admitting: Rehabilitation

## 2014-01-14 ENCOUNTER — Ambulatory Visit: Payer: No Typology Code available for payment source | Admitting: Rehabilitation

## 2014-01-16 ENCOUNTER — Ambulatory Visit: Payer: No Typology Code available for payment source | Admitting: Rehabilitation

## 2014-01-21 ENCOUNTER — Ambulatory Visit: Payer: No Typology Code available for payment source | Admitting: Physical Therapy

## 2014-01-23 ENCOUNTER — Ambulatory Visit: Payer: No Typology Code available for payment source | Attending: Family Medicine | Admitting: Rehabilitation

## 2014-01-23 DIAGNOSIS — M545 Low back pain, unspecified: Secondary | ICD-10-CM | POA: Insufficient documentation

## 2014-01-23 DIAGNOSIS — M25569 Pain in unspecified knee: Secondary | ICD-10-CM | POA: Insufficient documentation

## 2014-01-23 DIAGNOSIS — IMO0001 Reserved for inherently not codable concepts without codable children: Secondary | ICD-10-CM | POA: Insufficient documentation

## 2014-01-28 ENCOUNTER — Ambulatory Visit: Payer: No Typology Code available for payment source | Admitting: Rehabilitation

## 2014-01-30 ENCOUNTER — Ambulatory Visit (INDEPENDENT_AMBULATORY_CARE_PROVIDER_SITE_OTHER): Payer: No Typology Code available for payment source | Admitting: Family Medicine

## 2014-01-30 ENCOUNTER — Encounter: Payer: Self-pay | Admitting: Family Medicine

## 2014-01-30 VITALS — BP 132/62 | HR 43 | Ht 65.0 in | Wt 145.0 lb

## 2014-01-30 DIAGNOSIS — M79604 Pain in right leg: Secondary | ICD-10-CM

## 2014-01-30 DIAGNOSIS — M25569 Pain in unspecified knee: Secondary | ICD-10-CM

## 2014-01-30 DIAGNOSIS — M79609 Pain in unspecified limb: Secondary | ICD-10-CM

## 2014-01-30 NOTE — Patient Instructions (Signed)
We will continue with physical therapy for a few more visits. Do home exercises for you right side/leg on days you do not go to therapy. Avoid the knee exercise we were discussing though. Next steps to consider for your left knee are a cortisone injection;  For your right leg would be imaging of your low back to assess for a pinched nerve. Let me know if you want to pursue either of these. If you're doing well you can follow up with me as needed.

## 2014-01-31 NOTE — Assessment & Plan Note (Signed)
2/2 DJD.  Discussed OTC measures - tylenol, nsaids, glucosamine, capsaicin.  Consider injection - declined again today.  Home exercises.  Consider physical therapy.  F/u in 6 weeks or prn.

## 2014-01-31 NOTE — Progress Notes (Signed)
Patient ID: Michele Hale, female   DOB: 07/12/1946, 68 y.o.   MRN: [id number]  PCP: Angelica Chessman, MD  Subjective:   HPI: Patient is a 68 y.o. female here for left knee and right leg pain.  3/4: Patient reports for past few years she's had left knee pain. Has not tried anything other than pain medicine, anti-inflammatories for this. Had x-rays on 1/5 showing DJD. Swelling at times. No catching, locking, giving out. Pain diffuse and anterior.  Also having right leg pain radiating from buttock down leg. Some associated numbness as well. No acute injury here (fell in December but hit clavicle on right side). No bowel/bladder dysfunction. No symptoms into left leg.  4/8: Patient here with son. They report her back, sciatica symptoms are better with physical therapy and home exercises. She is pleased with her progress and feels therapy has helped her. Taking advil, doing home exercises. Knee still bothersome but does not want to do anything additional for this.  Past Medical History  Diagnosis Date  . Abscess   . IBS (irritable bowel syndrome)   . Breast mass   . Osteoporosis   . Cancer     breast - lt    Current Outpatient Prescriptions on File Prior to Visit  Medication Sig Dispense Refill  . acetaminophen (TYLENOL) 500 MG tablet Take 1,000 mg by mouth every 6 (six) hours as needed. For arthritis pain      . calcium-vitamin D (OSCAL WITH D) 500-200 MG-UNIT per tablet Take 1 tablet by mouth 2 (two) times daily.      . cholecalciferol (VITAMIN D) 1000 UNITS tablet Take 1 tablet (1,000 Units total) by mouth daily.  30 tablet  12  . cyclobenzaprine (FLEXERIL) 10 MG tablet Take 1 tablet (10 mg total) by mouth at bedtime.  30 tablet  2  . diclofenac sodium (VOLTAREN) 1 % GEL Apply 2 g topically 4 (four) times daily.  1 Tube  10  . letrozole (FEMARA) 2.5 MG tablet Take 1 tablet (2.5 mg total) by mouth daily.  180 tablet  1  . loratadine (CLARITIN) 10 MG tablet Take 1  tablet (10 mg total) by mouth daily.  30 tablet  3  . promethazine (PHENERGAN) 25 MG tablet Take 1 tablet (25 mg total) by mouth every 6 (six) hours as needed. For nausea  90 tablet  2  . traMADol (ULTRAM) 50 MG tablet Take 1 tablet (50 mg total) by mouth every 8 (eight) hours as needed.  20 tablet  1   No current facility-administered medications on file prior to visit.    Past Surgical History  Procedure Laterality Date  . Cesarean section    . Breast lumpectomy      lt breast    No Known Allergies  History   Social History  . Marital Status: Single    Spouse Name: N/A    Number of Children: N/A  . Years of Education: N/A   Occupational History  . Not on file.   Social History Main Topics  . Smoking status: Never Smoker   . Smokeless tobacco: Not on file  . Alcohol Use: No  . Drug Use: No  . Sexual Activity: Not Currently   Other Topics Concern  . Not on file   Social History Narrative  . No narrative on file    Family History  Problem Relation Age of Onset  . Cancer Father     liver  . Cancer Mother  liver    BP 132/62  Pulse 43  Ht 5\' 5"  (1.651 m)  Wt 145 lb (65.772 kg)  BMI 24.13 kg/m2  Review of Systems: See HPI above.    Objective:  Physical Exam:  Gen: NAD  Left knee: No gross deformity, ecchymoses, effusion.  + crepitation. TTP medial joint line, none lateral joint line or post patellar facets.  No other tenderness. FROM. Negative ant/post drawers. Negative valgus/varus testing. Negative lachmanns. Negative mcmurrays, apleys, patellar apprehension. NV intact distally.  Back/R hip: No gross deformity, scoliosis. Mild TTP piriformis.  No midline or bony TTP.  No paraspinal tenderness. FROM without pain. Strength LEs 5/5 all muscle groups except 4/5 with abduction.   Mild positive SLRs on right, negative left. Sensation intact to light touch bilaterally. Negative logroll bilateral hips Mild pain right piriformis.  Negative left  and negative fabers.    Assessment & Plan:  1. Left knee pain - 2/2 DJD.  Discussed OTC measures - tylenol, nsaids, glucosamine, capsaicin.  Consider injection - declined again today.  Home exercises.  Consider physical therapy.  F/u in 6 weeks or prn.  2. Right leg pain - 2/2 sciatica, piriformis syndrome.  Possible she has lumbar radiculopathy - has positive SLR today though making improvements with HEP, PT.  Continue with these.  Consider lumbar MRI if not improving.  F/u 6 weeks or prn.

## 2014-01-31 NOTE — Assessment & Plan Note (Signed)
2/2 sciatica, piriformis syndrome.  Possible she has lumbar radiculopathy - has positive SLR today though making improvements with HEP, PT.  Continue with these.  Consider lumbar MRI if not improving.  F/u 6 weeks or prn.

## 2014-02-12 ENCOUNTER — Ambulatory Visit: Payer: No Typology Code available for payment source | Admitting: Physical Therapy

## 2014-02-14 ENCOUNTER — Ambulatory Visit: Payer: No Typology Code available for payment source | Admitting: Rehabilitation

## 2014-02-19 ENCOUNTER — Ambulatory Visit: Payer: No Typology Code available for payment source | Admitting: Physical Therapy

## 2014-02-21 ENCOUNTER — Ambulatory Visit: Payer: No Typology Code available for payment source | Admitting: Rehabilitation

## 2014-02-26 ENCOUNTER — Telehealth: Payer: Self-pay | Admitting: Oncology

## 2014-02-26 NOTE — Telephone Encounter (Signed)
, °

## 2014-02-28 ENCOUNTER — Ambulatory Visit: Payer: No Typology Code available for payment source | Attending: Family Medicine | Admitting: Rehabilitation

## 2014-02-28 DIAGNOSIS — IMO0001 Reserved for inherently not codable concepts without codable children: Secondary | ICD-10-CM | POA: Insufficient documentation

## 2014-02-28 DIAGNOSIS — M545 Low back pain, unspecified: Secondary | ICD-10-CM | POA: Insufficient documentation

## 2014-02-28 DIAGNOSIS — M25569 Pain in unspecified knee: Secondary | ICD-10-CM | POA: Insufficient documentation

## 2014-03-05 ENCOUNTER — Ambulatory Visit: Payer: No Typology Code available for payment source | Admitting: Rehabilitation

## 2014-03-07 ENCOUNTER — Ambulatory Visit: Payer: No Typology Code available for payment source | Admitting: Rehabilitation

## 2014-03-08 ENCOUNTER — Ambulatory Visit
Admission: RE | Admit: 2014-03-08 | Discharge: 2014-03-08 | Disposition: A | Discharge status: Home / Self Care | Payer: No Typology Code available for payment source | Source: Ambulatory Visit

## 2014-03-08 ENCOUNTER — Ambulatory Visit
Admission: RE | Admit: 2014-03-08 | Discharge: 2014-03-08 | Disposition: A | Discharge status: Home / Self Care | Payer: No Typology Code available for payment source | Source: Ambulatory Visit | Attending: Hematology and Oncology | Admitting: Hematology and Oncology

## 2014-03-08 ENCOUNTER — Encounter (INDEPENDENT_AMBULATORY_CARE_PROVIDER_SITE_OTHER): Payer: Self-pay

## 2014-03-08 ENCOUNTER — Other Ambulatory Visit: Payer: Self-pay | Admitting: Hematology and Oncology

## 2014-03-08 DIAGNOSIS — Z853 Personal history of malignant neoplasm of breast: Secondary | ICD-10-CM

## 2014-03-08 DIAGNOSIS — C50919 Malignant neoplasm of unspecified site of unspecified female breast: Secondary | ICD-10-CM

## 2014-03-08 DIAGNOSIS — N6459 Other signs and symptoms in breast: Secondary | ICD-10-CM

## 2014-03-11 ENCOUNTER — Encounter (HOSPITAL_COMMUNITY): Payer: Self-pay | Admitting: Emergency Medicine

## 2014-03-11 ENCOUNTER — Emergency Department (INDEPENDENT_AMBULATORY_CARE_PROVIDER_SITE_OTHER)
Admission: EM | Admit: 2014-03-11 | Discharge: 2014-03-11 | Disposition: A | Discharge status: Home / Self Care | Payer: Self-pay | Source: Home / Self Care

## 2014-03-11 DIAGNOSIS — K047 Periapical abscess without sinus: Secondary | ICD-10-CM

## 2014-03-11 MED ORDER — PENICILLIN V POTASSIUM 500 MG PO TABS: 500.0000 mg | ORAL_TABLET | Freq: Four times a day (QID) | ORAL | Status: AC

## 2014-03-11 MED ORDER — HYDROCODONE-ACETAMINOPHEN 5-325 MG PO TABS: 1.0000 | ORAL_TABLET | Freq: Four times a day (QID) | ORAL | Status: DC | PRN

## 2014-03-11 NOTE — ED Notes (Signed)
C/o oral lesion on the left side of mouth States area is painful States when she brushes her teeth she notice blood States feels as if the wounds are open

## 2014-03-11 NOTE — Discharge Instructions (Signed)
Finish all of the antibiotics even if you are feeling better.  See a dentist as soon as possible.  Try swishing salt water around your mouth to help the infection heal.    Dental Abscess A dental abscess is a collection of infected fluid (pus) from a bacterial infection in the inner part of the tooth (pulp). It usually occurs at the end of the tooth's root.  CAUSES   Severe tooth decay.  Trauma to the tooth that allows bacteria to enter into the pulp, such as a broken or chipped tooth. SYMPTOMS   Severe pain in and around the infected tooth.  Swelling and redness around the abscessed tooth or in the mouth or face.  Tenderness.  Pus drainage.  Bad breath.  Bitter taste in the mouth.  Difficulty swallowing.  Difficulty opening the mouth.  Nausea.  Vomiting.  Chills.  Swollen neck glands. DIAGNOSIS   A medical and dental history will be taken.  An examination will be performed by tapping on the abscessed tooth.  X-rays may be taken of the tooth to identify the abscess. TREATMENT The goal of treatment is to eliminate the infection. You may be prescribed antibiotic medicine to stop the infection from spreading. A root canal may be performed to save the tooth. If the tooth cannot be saved, it may be pulled (extracted) and the abscess may be drained.  HOME CARE INSTRUCTIONS  Only take over-the-counter or prescription medicines for pain, fever, or discomfort as directed by your caregiver.  Rinse your mouth (gargle) often with salt water ( tsp salt in 8 oz [250 ml] of warm water) to relieve pain or swelling.  Do not drive after taking pain medicine (narcotics).  Do not apply heat to the outside of your face.  Return to your dentist for further treatment as directed. SEEK MEDICAL CARE IF:  Your pain is not helped by medicine.  Your pain is getting worse instead of better. SEEK IMMEDIATE MEDICAL CARE IF:  You have a fever or persistent symptoms for more than 2 3  days.  You have a fever and your symptoms suddenly get worse.  You have chills or a very bad headache.  You have problems breathing or swallowing.  You have trouble opening your mouth.  You have swelling in the neck or around the eye. Document Released: 10/11/2005 Document Revised: 07/05/2012 Document Reviewed: 01/19/2011 [facility] Patient Information 2014 [place], [place].

## 2014-03-11 NOTE — ED Provider Notes (Signed)
CSN: [id number]     Arrival date & time 03/11/14  0940 History   None    Chief Complaint  Patient presents with  . Mouth Lesions   (Consider location/radiation/quality/duration/timing/severity/associated sxs/prior Treatment) HPI Comments: Pt with sore and swollen gums for 2 days  Patient is a 68 y.o. female presenting with mouth sores. The history is provided by a relative. Language interpreter used: son acted as Astronomer.  Mouth Lesions Location:  Upper gingiva Upper gingiva location:  L buccal Quality:  Painful Pain details:    Quality:  Aching   Severity:  Severe   Duration:  2 days   Timing:  Unable to specify   Progression:  Worsening Onset quality:  Gradual Severity:  Severe Duration:  2 days Progression:  Worsening Chronicity:  New Relieved by:  None tried Worsened by:  Nothing tried Ineffective treatments:  None tried Associated symptoms: dental pain   Associated symptoms: no fever     Past Medical History  Diagnosis Date  . Abscess   . IBS (irritable bowel syndrome)   . Breast mass   . Osteoporosis   . Cancer     breast - lt   Past Surgical History  Procedure Laterality Date  . Cesarean section    . Breast lumpectomy      lt breast   Family History  Problem Relation Age of Onset  . Cancer Father     liver  . Cancer Mother     liver   History  Substance Use Topics  . Smoking status: Never Smoker   . Smokeless tobacco: Not on file  . Alcohol Use: No   OB History   Grav Para Term Preterm Abortions TAB SAB Ect Mult Living                 Review of Systems  Constitutional: Negative for fever and chills.  HENT: Positive for dental problem and mouth sores.     Allergies  Review of patient's allergies indicates no known allergies.  Home Medications   Prior to Admission medications   Medication Sig Start Date End Date Taking? Authorizing Provider  acetaminophen (TYLENOL) 500 MG tablet Take 1,000 mg by mouth every 6 (six) hours as  needed. For arthritis pain    Historical Provider, MD  calcium-vitamin D (OSCAL WITH D) 500-200 MG-UNIT per tablet Take 1 tablet by mouth 2 (two) times daily.    Historical Provider, MD  cholecalciferol (VITAMIN D) 1000 UNITS tablet Take 1 tablet (1,000 Units total) by mouth daily. 01/23/13   Deatra Robinson, MD  cyclobenzaprine (FLEXERIL) 10 MG tablet Take 1 tablet (10 mg total) by mouth at bedtime. 12/25/13   Lorayne Marek, MD  diclofenac sodium (VOLTAREN) 1 % GEL Apply 2 g topically 4 (four) times daily. 03/29/13   Reyne Dumas, MD  HYDROcodone-acetaminophen (NORCO/VICODIN) 5-325 MG per tablet Take 1 tablet by mouth every 6 (six) hours as needed. 03/11/14   Carvel Getting, NP  letrozole [facility]) 2.5 MG tablet Take 1 tablet (2.5 mg total) by mouth daily. 10/15/13   Minette Headland, NP  loratadine (CLARITIN) 10 MG tablet Take 1 tablet (10 mg total) by mouth daily. 12/25/13   Lorayne Marek, MD  penicillin v potassium (VEETID) 500 MG tablet Take 1 tablet (500 mg total) by mouth 4 (four) times daily. 03/11/14 03/18/14  Carvel Getting, NP  promethazine (PHENERGAN) 25 MG tablet Take 1 tablet (25 mg total) by mouth every 6 (six) hours as needed. For  nausea 04/11/13   Deatra Robinson, MD  traMADol (ULTRAM) 50 MG tablet Take 1 tablet (50 mg total) by mouth every 8 (eight) hours as needed. 11/30/13   Wilmon Arms, MD   BP 115/77  Pulse 79  Temp(Src) 98.6 F (37 C) (Oral)  SpO2 97% Physical Exam  Constitutional: She appears well-developed and well-nourished. No distress.  HENT:  Mouth/Throat: Abnormal dentition. Dental abscesses and dental caries present.  All teeth in very poor repair, some missing, some old fractures.  Buccal gingiva adjacent to L upper first and 2nd molars mild swelling, no obvious pus pocket, tender to palp    ED Course  Procedures (including critical care time) Labs Review Labs Reviewed - No data to display  Imaging Review No results found.   MDM   1. Dental abscess   rx pcn  500mg  QID #40 and hydrocodone/apap 5/325 1 po q6hrs prn pain #10. Pt given dental resources.      Carvel Getting, NP 03/11/14 [phone]

## 2014-03-12 NOTE — ED Provider Notes (Signed)
Medical screening examination/treatment/procedure(s) were performed by non-physician practitioner and as supervising physician I was immediately available for consultation/collaboration.  Philipp Deputy, M.D.  Harden Mo, MD 03/12/14 [phone]

## 2014-03-13 ENCOUNTER — Telehealth: Payer: Self-pay | Admitting: Internal Medicine

## 2014-03-13 NOTE — Telephone Encounter (Signed)
Pt was seen at [facility] and was told needs to see Dentist, pt has [place] orange card so needs referral by PCP for dental visit.  Please f/u with pt/daughter regarding whether pt needs office visit for referral to be placed.  Pt was last seen in March.

## 2014-03-14 ENCOUNTER — Telehealth: Payer: Self-pay

## 2014-03-14 ENCOUNTER — Other Ambulatory Visit: Payer: Self-pay

## 2014-03-14 DIAGNOSIS — K0889 Other specified disorders of teeth and supporting structures: Secondary | ICD-10-CM

## 2014-03-14 NOTE — Telephone Encounter (Signed)
Left message on voice mail that the referral to dentist was placed in EPIC

## 2014-03-21 ENCOUNTER — Other Ambulatory Visit: Payer: Self-pay | Admitting: *Deleted

## 2014-03-21 DIAGNOSIS — C50919 Malignant neoplasm of unspecified site of unspecified female breast: Secondary | ICD-10-CM

## 2014-03-21 MED ORDER — PROMETHAZINE HCL 25 MG PO TABS: 25.0000 mg | ORAL_TABLET | Freq: Four times a day (QID) | ORAL | Status: DC | PRN

## 2014-04-04 ENCOUNTER — Telehealth: Payer: Self-pay | Admitting: Adult Health

## 2014-04-04 NOTE — Telephone Encounter (Signed)
S/W PT'S DAUGHTER, ADVISED APPTS FOR 6/15 AND 6/22 HAVE BEEN MOVED TO 7/17 @ 3.15 AND 7/24 @ 3.15P. ADIVSED I WIL ALSO MAIL APPT CALENDAR.

## 2014-04-08 ENCOUNTER — Other Ambulatory Visit: Payer: Self-pay

## 2014-04-15 ENCOUNTER — Ambulatory Visit: Payer: Self-pay | Admitting: Adult Health

## 2014-05-09 ENCOUNTER — Other Ambulatory Visit: Payer: Self-pay | Admitting: *Deleted

## 2014-05-09 DIAGNOSIS — C50912 Malignant neoplasm of unspecified site of left female breast: Secondary | ICD-10-CM

## 2014-05-10 ENCOUNTER — Other Ambulatory Visit (HOSPITAL_BASED_OUTPATIENT_CLINIC_OR_DEPARTMENT_OTHER): Payer: Self-pay

## 2014-05-10 DIAGNOSIS — C50519 Malignant neoplasm of lower-outer quadrant of unspecified female breast: Secondary | ICD-10-CM

## 2014-05-10 DIAGNOSIS — C50912 Malignant neoplasm of unspecified site of left female breast: Secondary | ICD-10-CM

## 2014-05-10 LAB — COMPREHENSIVE METABOLIC PANEL (CC13)
ALT: 15 U/L (ref 0–55)
ANION GAP: 7 meq/L (ref 3–11)
AST: 23 U/L (ref 5–34)
Albumin: 3.8 g/dL (ref 3.5–5.0)
Alkaline Phosphatase: 51 U/L (ref 40–150)
BUN: 9 mg/dL (ref 7.0–26.0)
CALCIUM: 9.5 mg/dL (ref 8.4–10.4)
CHLORIDE: 107 meq/L (ref 98–109)
CO2: 29 meq/L (ref 22–29)
CREATININE: 0.8 mg/dL (ref 0.6–1.1)
GLUCOSE: 113 mg/dL (ref 70–140)
Potassium: 4.1 mEq/L (ref 3.5–5.1)
Sodium: 143 mEq/L (ref 136–145)
Total Bilirubin: 0.4 mg/dL (ref 0.20–1.20)
Total Protein: 7.3 g/dL (ref 6.4–8.3)

## 2014-05-10 LAB — CBC WITH DIFFERENTIAL/PLATELET
BASO%: 1.2 % (ref 0.0–2.0)
BASOS ABS: 0 10*3/uL (ref 0.0–0.1)
EOS ABS: 0.1 10*3/uL (ref 0.0–0.5)
EOS%: 1.6 % (ref 0.0–7.0)
HCT: 36.1 % (ref 34.8–46.6)
HEMOGLOBIN: 11.7 g/dL (ref 11.6–15.9)
LYMPH#: 1.3 10*3/uL (ref 0.9–3.3)
LYMPH%: 36.1 % (ref 14.0–49.7)
MCH: 26.2 pg (ref 25.1–34.0)
MCHC: 32.4 g/dL (ref 31.5–36.0)
MCV: 81 fL (ref 79.5–101.0)
MONO#: 0.4 10*3/uL (ref 0.1–0.9)
MONO%: 10.4 % (ref 0.0–14.0)
NEUT%: 50.7 % (ref 38.4–76.8)
NEUTROS ABS: 1.8 10*3/uL (ref 1.5–6.5)
PLATELETS: 269 10*3/uL (ref 145–400)
RBC: 4.45 10*6/uL (ref 3.70–5.45)
RDW: 13.7 % (ref 11.2–14.5)
WBC: 3.6 10*3/uL — AB (ref 3.9–10.3)

## 2014-05-17 ENCOUNTER — Ambulatory Visit (HOSPITAL_BASED_OUTPATIENT_CLINIC_OR_DEPARTMENT_OTHER): Payer: Self-pay | Admitting: Adult Health

## 2014-05-17 ENCOUNTER — Ambulatory Visit (HOSPITAL_BASED_OUTPATIENT_CLINIC_OR_DEPARTMENT_OTHER): Payer: Self-pay

## 2014-05-17 ENCOUNTER — Other Ambulatory Visit: Payer: Self-pay | Admitting: Adult Health

## 2014-05-17 ENCOUNTER — Telehealth: Payer: Self-pay | Admitting: Adult Health

## 2014-05-17 VITALS — BP 133/84 | HR 81 | Temp 98.3°F | Resp 16 | Ht 65.0 in | Wt 135.0 lb

## 2014-05-17 DIAGNOSIS — M810 Age-related osteoporosis without current pathological fracture: Secondary | ICD-10-CM

## 2014-05-17 DIAGNOSIS — Z170 Estrogen receptor positive status [ER+]: Secondary | ICD-10-CM

## 2014-05-17 DIAGNOSIS — M949 Disorder of cartilage, unspecified: Secondary | ICD-10-CM

## 2014-05-17 DIAGNOSIS — R300 Dysuria: Secondary | ICD-10-CM

## 2014-05-17 DIAGNOSIS — C50519 Malignant neoplasm of lower-outer quadrant of unspecified female breast: Secondary | ICD-10-CM

## 2014-05-17 DIAGNOSIS — E2839 Other primary ovarian failure: Secondary | ICD-10-CM

## 2014-05-17 DIAGNOSIS — M2550 Pain in unspecified joint: Secondary | ICD-10-CM

## 2014-05-17 DIAGNOSIS — M899 Disorder of bone, unspecified: Secondary | ICD-10-CM

## 2014-05-17 DIAGNOSIS — C50919 Malignant neoplasm of unspecified site of unspecified female breast: Secondary | ICD-10-CM

## 2014-05-17 DIAGNOSIS — R110 Nausea: Secondary | ICD-10-CM

## 2014-05-17 DIAGNOSIS — C50912 Malignant neoplasm of unspecified site of left female breast: Secondary | ICD-10-CM

## 2014-05-17 LAB — URINALYSIS, MICROSCOPIC - CHCC
Bilirubin (Urine): NEGATIVE
Blood: NEGATIVE
Glucose: NEGATIVE mg/dL
Ketones: NEGATIVE mg/dL
Nitrite: NEGATIVE
Protein: <30 mg/dL
RBC / HPF: NEGATIVE (ref 0–2)
Specific Gravity, Urine: 1.03 (ref 1.003–1.035)
Urobilinogen, UR: 0.2 mg/dL (ref 0.2–1)
pH: 5 (ref 4.6–8.0)

## 2014-05-17 NOTE — Telephone Encounter (Signed)
per pof to sch appts-sch BD @ [facility] gave pt appt time & date-gave pt copy of appt

## 2014-05-17 NOTE — Patient Instructions (Signed)
You are doing well.  You have no sign of breast cancer recurrence.  Continue taking Letrozole daily.  I recommend healthy diet, exercise, monthly breast exams.  I do recommend that you have a pap smear and colonoscopy.  We will see you back in 6 months.  Please call us if you have any questions or concerns.     Pap Test A Pap test checks the cells on the surface of your cervix. Your doctor will look for cell changes that are not normal, an infection, or cancer. If the cells no longer look normal, it is called dysplasia. Dysplasia can turn into cancer. Regular Pap tests are important to stop cancer from developing. BEFORE THE PROCEDURE  Ask your doctor when to schedule your Pap test. Timing the test around your period may be important.  Do not douche or have sex (intercourse) for 24 hours before the test.  Do not put creams on your vagina or use tampons for 24 hours before the test.  Go pee (urinate) just before the test. PROCEDURE  You will lie on an exam table with your feet in stirrups.  A warm metal or plastic tool (speculum) will be put in your vagina to open it up.  Your doctor will use a small, plastic brush or wooden spatula to take cells from your cervix.  The cells will be put in a lab container.  The cells will be checked under a microscope to see if they are normal or not. AFTER THE PROCEDURE Get your test results. If they are abnormal, you may need more tests. Document Released: 11/13/2010 Document Revised: 01/03/2012 Document Reviewed: 10/07/2011 [facility] Patient Information 2015 [place], [place]. This information is not intended to replace advice given to you by your health care provider. Make sure you discuss any questions you have with your health care provider. Colonoscopy A colonoscopy is an exam to look at the entire large intestine (colon). This exam can help find problems such as tumors, polyps, inflammation, and areas of bleeding. The exam takes about 1 hour.  LET  [facility] CARE PROVIDER KNOW ABOUT:   Any allergies you have.  All medicines you are taking, including vitamins, herbs, eye drops, creams, and over-the-counter medicines.  Previous problems you or members of your family have had with the use of anesthetics.  Any blood disorders you have.  Previous surgeries you have had.  Medical conditions you have. RISKS AND COMPLICATIONS  Generally, this is a safe procedure. However, as with any procedure, complications can occur. Possible complications include:  Bleeding.  Tearing or rupture of the colon wall.  Reaction to medicines given during the exam.  Infection (rare). BEFORE THE PROCEDURE   Ask your health care provider about changing or stopping your regular medicines.  You may be prescribed an oral bowel prep. This involves drinking a large amount of medicated liquid, starting the day before your procedure. The liquid will cause you to have multiple loose stools until your stool is almost clear or light green. This cleans out your colon in preparation for the procedure.  Do not eat or drink anything else once you have started the bowel prep, unless your health care provider tells you it is safe to do so.  Arrange for someone to drive you home after the procedure. PROCEDURE   You will be given medicine to help you relax (sedative).  You will lie on your side with your knees bent.  A long, flexible tube with a light and camera on  the end (colonoscope) will be inserted through the rectum and into the colon. The camera sends video back to a computer screen as it moves through the colon. The colonoscope also releases carbon dioxide gas to inflate the colon. This helps your health care provider see the area better.  During the exam, your health care provider may take a small tissue sample (biopsy) to be examined under a microscope if any abnormalities are found.  The exam is finished when the entire colon has been viewed. AFTER THE  PROCEDURE   Do not drive for 24 hours after the exam.  You may have a small amount of blood in your stool.  You may pass moderate amounts of gas and have mild abdominal cramping or bloating. This is caused by the gas used to inflate your colon during the exam.  Ask when your test results will be ready and how you will get your results. Make sure you get your test results. Document Released: 10/08/2000 Document Revised: 08/01/2013 Document Reviewed: 06/18/2013 [facility] Patient Information 2015 [place], [place]. This information is not intended to replace advice given to you by your health care provider. Make sure you discuss any questions you have with your health care provider. Breast Self-Awareness Practicing breast self-awareness may pick up problems early, prevent significant medical complications, and possibly save your life. By practicing breast self-awareness, you can become familiar with how your breasts look and feel and if your breasts are changing. This allows you to notice changes early. It can also offer you some reassurance that your breast health is good. One way to learn what is normal for your breasts and whether your breasts are changing is to do a breast self-exam. If you find a lump or something that was not present in the past, it is best to contact your caregiver right away. Other findings that should be evaluated by your caregiver include nipple discharge, especially if it is bloody; skin changes or reddening; areas where the skin seems to be pulled in (retracted); or new lumps and bumps. Breast pain is seldom associated with cancer (malignancy), but should also be evaluated by a caregiver. HOW TO PERFORM A BREAST SELF-EXAM The best time to examine your breasts is 5-7 days after your menstrual period is over. During menstruation, the breasts are lumpier, and it may be more difficult to pick up changes. If you do not menstruate, have reached menopause, or had your uterus removed  (hysterectomy), you should examine your breasts at regular intervals, such as monthly. If you are breastfeeding, examine your breasts after a feeding or after using a breast pump. Breast implants do not decrease the risk for lumps or tumors, so continue to perform breast self-exams as recommended. Talk to your caregiver about how to determine the difference between the implant and breast tissue. Also, talk about the amount of pressure you should use during the exam. Over time, you will become more familiar with the variations of your breasts and more comfortable with the exam. A breast self-exam requires you to remove all your clothes above the waist. 1. Look at your breasts and nipples. Stand in front of a mirror in a room with good lighting. With your hands on your hips, push your hands firmly downward. Look for a difference in shape, contour, and size from one breast to the other (asymmetry). Asymmetry includes puckers, dips, or bumps. Also, look for skin changes, such as reddened or scaly areas on the breasts. Look for nipple changes, such as discharge,  dimpling, repositioning, or redness. 2. Carefully feel your breasts. This is best done either in the shower or tub while using soapy water or when flat on your back. Place the arm (on the side of the breast you are examining) above your head. Use the pads (not the fingertips) of your three middle fingers on your opposite hand to feel your breasts. Start in the underarm area and use  inch (2 cm) overlapping circles to feel your breast. Use 3 different levels of pressure (light, medium, and firm pressure) at each circle before moving to the next circle. The light pressure is needed to feel the tissue closest to the skin. The medium pressure will help to feel breast tissue a little deeper, while the firm pressure is needed to feel the tissue close to the ribs. Continue the overlapping circles, moving downward over the breast until you feel your ribs below your  breast. Then, move one finger-width towards the center of the body. Continue to use the  inch (2 cm) overlapping circles to feel your breast as you move slowly up toward the collar bone (clavicle) near the base of the neck. Continue the up and down exam using all 3 pressures until you reach the middle of the chest. Do this with each breast, carefully feeling for lumps or changes. 3.  Keep a written record with breast changes or normal findings for each breast. By writing this information down, you do not need to depend only on memory for size, tenderness, or location. Write down where you are in your menstrual cycle, if you are still menstruating. Breast tissue can have some lumps or thick tissue. However, see your caregiver if you find anything that concerns you.  SEEK MEDICAL CARE IF:  You see a change in shape, contour, or size of your breasts or nipples.   You see skin changes, such as reddened or scaly areas on the breasts or nipples.   You have an unusual discharge from your nipples.   You feel a new lump or unusually thick areas.  Document Released: 10/11/2005 Document Revised: 09/27/2012 Document Reviewed: 01/26/2012 [facility] Patient Information 2015 [place], [place]. This information is not intended to replace advice given to you by your health care provider. Make sure you discuss any questions you have with your health care provider.

## 2014-05-17 NOTE — Progress Notes (Signed)
OFFICE PROGRESS NOTE  CC: Breast cancer follow up  Dr. Autumn Messing  DIAGNOSIS: 68 year old female with history of left breast lumpectomy and sentinel lymph node biopsy for a  T1 C. N0 breast cancer.  PRIOR THERAPY:  #1 patient underwent a lumpectomy of the left breast in January, 2012 that revealed a T1 C. N0 invasive mammary carcinoma all nodes were negative tumor was ER/PR positive.  #2 she then went on to have radiation therapy to the left breast followed by Femara 2.5 mg daily by Dr. Eston Esters.   CURRENT THERAPY: Femara 2.5 mg daily  INTERVAL HISTORY: Michele Hale 67 y.o. female returns for followup visit. She is accompanied an Arabic interpreter.  She is taking Letrozole daily.  She is tolerating this medication well.  She does have occasional joint pain that is tolerable, and she takes Tylenol or Ibuprofen occasionally for this.  She has been in physical therapy for joint pain previously.  She does occasionally have hot flashes.  She is c/o dysuria today that has been going on for about 2 months.  She denies hesitancy, incontinence, fevers, hematuria, or any further difficulty with urination.  She does have a longstanding h/o nausea and has been on Phenergan for 20 years or more.  She started this in [place].  She continues to be on Phenergan, and cannot eat or drink anything until she has this.  She does go to the [facility] for Primary care visits. She has recently had a dental abscess and has been referred for a dental evaluation that has not yet occurred. She denies fevers, chills, bowel/bladder changes, new pain, unintentional weight loss, or any further concerns.    MEDICAL HISTORY: Past Medical History  Diagnosis Date  . Abscess   . IBS (irritable bowel syndrome)   . Breast mass   . Osteoporosis   . Cancer     breast - lt    ALLERGIES:  has No Known Allergies.  MEDICATIONS:  Current Outpatient Prescriptions  Medication Sig Dispense Refill  .  calcium-vitamin D (OSCAL WITH D) 500-200 MG-UNIT per tablet Take 1 tablet by mouth 2 (two) times daily.      . cholecalciferol (VITAMIN D) 1000 UNITS tablet Take 1 tablet (1,000 Units total) by mouth daily.  30 tablet  12  . ibuprofen (ADVIL,MOTRIN) 800 MG tablet Take 800 mg by mouth every 8 (eight) hours as needed.      Marland Kitchen letrozole (FEMARA) 2.5 MG tablet Take 1 tablet (2.5 mg total) by mouth daily.  180 tablet  1  . promethazine (PHENERGAN) 25 MG tablet Take 1 tablet (25 mg total) by mouth every 6 (six) hours as needed. For nausea  90 tablet  0  . acetaminophen (TYLENOL) 500 MG tablet Take 1,000 mg by mouth every 6 (six) hours as needed. For arthritis pain      . HYDROcodone-acetaminophen (NORCO/VICODIN) 5-325 MG per tablet Take 1 tablet by mouth every 6 (six) hours as needed.  10 tablet  0  . loratadine (CLARITIN) 10 MG tablet Take 1 tablet (10 mg total) by mouth daily.  30 tablet  3   No current facility-administered medications for this visit.    SURGICAL HISTORY:  Past Surgical History  Procedure Laterality Date  . Cesarean section    . Breast lumpectomy      lt breast    REVIEW OF SYSTEMS:  A 10 point review of systems was conducted and is otherwise negative except for what is noted above.  HEALTH MAINTENANCE:  Mammogram: 02/2014 Colonoscopy: never, states that it was not previously recommended Bone Density Scan: 05/22/2012 Pap Smear: never had prior, declines referral Eye Exam: never Vitamin D Level: Lipid Panel: unsure of last time   PHYSICAL EXAMINATION: Blood pressure 133/84, pulse 81, temperature 98.3 F (36.8 C), temperature source Oral, resp. rate 16, height 5\' 5"  (1.651 m), weight 135 lb (61.236 kg). Body mass index is 22.47 kg/(m^2). GENERAL: Patient is a well appearing female in no acute distress HEENT:  Sclerae anicteric.  Oropharynx clear and moist. No ulcerations or evidence of oropharyngeal candidiasis. Neck is supple.  NODES:  No cervical, supraclavicular,  or axillary lymphadenopathy palpated.  BREAST EXAM:  Left breast s/p lumpectomy, no nodules, masses, right breast no nodules masses, benign bilateral breast exam. LUNGS:  Clear to auscultation bilaterally.  No wheezes or rhonchi. HEART:  Regular rate and rhythm. No murmur appreciated. ABDOMEN:  Soft, nontender.  Positive, normoactive bowel sounds. No organomegaly palpated. MSK:  No focal spinal tenderness to palpation. Full range of motion bilaterally in the upper extremities. EXTREMITIES:  No peripheral edema.   SKIN:  Clear with no obvious rashes or skin changes. No nail dyscrasia. NEURO:  Nonfocal. Well oriented.  Appropriate affect. ECOG PERFORMANCE STATUS: 1 - Symptomatic but completely ambulatory  LABORATORY DATA: Lab Results  Component Value Date   WBC 3.6* 05/10/2014   HGB 11.7 05/10/2014   HCT 36.1 05/10/2014   MCV 81.0 05/10/2014   PLT 269 05/10/2014      Chemistry      Component Value Date/Time   NA 143 05/10/2014 1509   NA 139 02/07/2012 1312   K 4.1 05/10/2014 1509   K 4.2 02/07/2012 1312   CL 104 01/23/2013 1420   CL 103 02/07/2012 1312   CO2 29 05/10/2014 1509   CO2 26 02/07/2012 1312   BUN 9.0 05/10/2014 1509   BUN 13 02/07/2012 1312   CREATININE 0.8 05/10/2014 1509   CREATININE 0.73 02/07/2012 1312      Component Value Date/Time   CALCIUM 9.5 05/10/2014 1509   CALCIUM 9.8 02/07/2012 1312   ALKPHOS 51 05/10/2014 1509   ALKPHOS 63 02/07/2012 1312   AST 23 05/10/2014 1509   AST 17 02/07/2012 1312   ALT 15 05/10/2014 1509   ALT 15 02/07/2012 1312   BILITOT 0.40 05/10/2014 1509   BILITOT 0.3 02/07/2012 1312       RADIOGRAPHIC STUDIES:  No results found.  ASSESSMENT: 68 year-old female with  #1 stage I invasive mammary carcinoma of the left breast status post lumpectomy followed by radiation now on Femara   #2 osteoporosis/osteopenia on calcium with vitamin D supplementation along with Prolia    PLAN:  Raidyn is doing well taking Femara daily.  I have recommended that  she continue this daily.  She has no sign of recurrence.    I ordered a repeat bone density as it is due.  She does have osteoporosis/osteopenia, however I am concerned about her recent dental abscess and the fact that she has not yet been evaluated by a dentist.  I reviewed with the patient the risk of osteonecrosis of the jaw, and we will hold Prolia until she has the dental evaluation performed.  She was recommended to continue with calcium and vitamin D supplementation.    We reviewed her health maintenance above.  I offered to place a referral to gynecology and gastroenterology.  I informed her of the reasons these screenings are important, particularly with her longstanding  h/o nausea managed by Phenergan.  She would like to talk to her son regarding this first.    In regards to her dysuria, I recommended that she increase her water intake.  A urinalysis and urine culture is pending.    She is traveling to [place] soon and requested a 6 month refill of Letrozole and Phenergan which I prescribed.    Hildegard will return in 6 months for labs and evaluation.    All questions were answered. The patient knows to call the clinic with any problems  or concerns. We can certainly see the patient much sooner if necessary.  I spent 40 minutes counseling the patient face to face. The total time spent in the appointment was 50 minutes.  Minette Headland, [place] [phone]

## 2014-05-19 LAB — URINE CULTURE

## 2014-05-20 ENCOUNTER — Encounter: Payer: Self-pay | Admitting: Adult Health

## 2014-05-20 DIAGNOSIS — C50512 Malignant neoplasm of lower-outer quadrant of left female breast: Secondary | ICD-10-CM | POA: Insufficient documentation

## 2014-05-20 DIAGNOSIS — R110 Nausea: Secondary | ICD-10-CM | POA: Insufficient documentation

## 2014-05-20 DIAGNOSIS — E2839 Other primary ovarian failure: Secondary | ICD-10-CM | POA: Insufficient documentation

## 2014-05-20 DIAGNOSIS — R300 Dysuria: Secondary | ICD-10-CM | POA: Insufficient documentation

## 2014-05-20 MED ORDER — PROMETHAZINE HCL 25 MG PO TABS: 25.0000 mg | ORAL_TABLET | Freq: Four times a day (QID) | ORAL | Status: DC | PRN

## 2014-05-20 MED ORDER — LETROZOLE 2.5 MG PO TABS: 2.5000 mg | ORAL_TABLET | Freq: Every day | ORAL | Status: DC

## 2014-05-21 ENCOUNTER — Other Ambulatory Visit: Payer: Self-pay | Admitting: *Deleted

## 2014-05-21 DIAGNOSIS — C50919 Malignant neoplasm of unspecified site of unspecified female breast: Secondary | ICD-10-CM

## 2014-05-21 DIAGNOSIS — C50912 Malignant neoplasm of unspecified site of left female breast: Secondary | ICD-10-CM

## 2014-05-21 MED ORDER — LETROZOLE 2.5 MG PO TABS: 2.5000 mg | ORAL_TABLET | Freq: Every day | ORAL | Status: DC

## 2014-05-22 ENCOUNTER — Ambulatory Visit (HOSPITAL_COMMUNITY)
Admission: RE | Admit: 2014-05-22 | Discharge: 2014-05-22 | Disposition: A | Discharge status: Home / Self Care | Payer: No Typology Code available for payment source | Source: Ambulatory Visit | Attending: Adult Health | Admitting: Adult Health

## 2014-05-22 DIAGNOSIS — E2839 Other primary ovarian failure: Secondary | ICD-10-CM

## 2014-05-22 DIAGNOSIS — Z13820 Encounter for screening for osteoporosis: Secondary | ICD-10-CM | POA: Insufficient documentation

## 2014-05-22 DIAGNOSIS — Z780 Asymptomatic menopausal state: Secondary | ICD-10-CM | POA: Insufficient documentation

## 2014-07-25 ENCOUNTER — Other Ambulatory Visit: Payer: Self-pay

## 2014-07-25 ENCOUNTER — Telehealth: Payer: Self-pay | Admitting: Hematology and Oncology

## 2014-07-25 NOTE — Telephone Encounter (Signed)
, °

## 2014-07-26 ENCOUNTER — Telehealth: Payer: Self-pay | Admitting: Hematology and Oncology

## 2014-07-26 NOTE — Telephone Encounter (Signed)
, °

## 2014-07-29 ENCOUNTER — Ambulatory Visit (HOSPITAL_BASED_OUTPATIENT_CLINIC_OR_DEPARTMENT_OTHER): Payer: No Typology Code available for payment source

## 2014-07-29 ENCOUNTER — Other Ambulatory Visit: Payer: Self-pay | Admitting: Adult Health

## 2014-07-29 VITALS — BP 128/70 | HR 75 | Temp 98.9°F

## 2014-07-29 DIAGNOSIS — M810 Age-related osteoporosis without current pathological fracture: Secondary | ICD-10-CM

## 2014-07-29 MED ORDER — DENOSUMAB 60 MG/ML ~~LOC~~ SOLN
60.0000 mg | Freq: Once | SUBCUTANEOUS | Status: AC
  Administered 2014-07-29: 60 mg via SUBCUTANEOUS
  Filled 2014-07-29: qty 1

## 2014-08-12 ENCOUNTER — Ambulatory Visit: Payer: No Typology Code available for payment source

## 2014-08-21 ENCOUNTER — Other Ambulatory Visit: Payer: Self-pay | Admitting: *Deleted

## 2014-08-21 DIAGNOSIS — C50919 Malignant neoplasm of unspecified site of unspecified female breast: Secondary | ICD-10-CM

## 2014-08-21 DIAGNOSIS — C50912 Malignant neoplasm of unspecified site of left female breast: Secondary | ICD-10-CM

## 2014-08-21 MED ORDER — PROMETHAZINE HCL 25 MG PO TABS: 25.0000 mg | ORAL_TABLET | Freq: Four times a day (QID) | ORAL | Status: DC | PRN

## 2014-08-21 MED ORDER — LETROZOLE 2.5 MG PO TABS: 2.5000 mg | ORAL_TABLET | Freq: Every day | ORAL | Status: DC

## 2014-11-20 ENCOUNTER — Other Ambulatory Visit: Payer: Self-pay | Admitting: *Deleted

## 2014-11-20 DIAGNOSIS — C50919 Malignant neoplasm of unspecified site of unspecified female breast: Secondary | ICD-10-CM

## 2014-11-21 ENCOUNTER — Other Ambulatory Visit: Payer: Self-pay

## 2014-11-21 ENCOUNTER — Ambulatory Visit: Payer: Self-pay | Admitting: Hematology and Oncology

## 2014-11-21 NOTE — Assessment & Plan Note (Signed)
Left breast invasive mammary cancer status post lumpectomy January 2012 and radiation currently on Femara since 2012. ER/PR positive HER-2 negative, T1 cN0 M0 stage IA  Femara toxicities: Osteoporosis: Bone density done 2015 revealed T score -4.1. I encouraged her to take calcium and vitamin D and bisphosphonate therapy.   patient will complete Femara in one year  Breast cancer surveillance: 1. Breast exam done 11/13/2014 is normal 2. Mammograms done in 2015 were normal    Return to clinic 1 year for follow-up

## 2015-02-20 ENCOUNTER — Telehealth: Payer: Self-pay | Admitting: *Deleted

## 2015-02-20 NOTE — Telephone Encounter (Signed)
Received voice message from husband stating," my wife needs to see a doctor because she has something growing on her head. Please help me. Return number is [phone]."

## 2015-02-20 NOTE — Telephone Encounter (Signed)
Michele Hale, Can she see her PCP? If she doesn't have one, please have her see symptom management Thx

## 2015-02-21 ENCOUNTER — Emergency Department (HOSPITAL_COMMUNITY): Payer: Self-pay

## 2015-02-21 ENCOUNTER — Encounter (HOSPITAL_COMMUNITY): Payer: Self-pay | Admitting: Emergency Medicine

## 2015-02-21 ENCOUNTER — Emergency Department (HOSPITAL_COMMUNITY): Payer: No Typology Code available for payment source

## 2015-02-21 ENCOUNTER — Emergency Department (HOSPITAL_COMMUNITY)
Admission: EM | Admit: 2015-02-21 | Discharge: 2015-02-21 | Disposition: A | Discharge status: Home / Self Care | Payer: No Typology Code available for payment source | Attending: Emergency Medicine | Admitting: Emergency Medicine

## 2015-02-21 DIAGNOSIS — M810 Age-related osteoporosis without current pathological fracture: Secondary | ICD-10-CM | POA: Insufficient documentation

## 2015-02-21 DIAGNOSIS — Z8719 Personal history of other diseases of the digestive system: Secondary | ICD-10-CM | POA: Insufficient documentation

## 2015-02-21 DIAGNOSIS — Z853 Personal history of malignant neoplasm of breast: Secondary | ICD-10-CM | POA: Insufficient documentation

## 2015-02-21 DIAGNOSIS — Z79899 Other long term (current) drug therapy: Secondary | ICD-10-CM | POA: Insufficient documentation

## 2015-02-21 DIAGNOSIS — Z8742 Personal history of other diseases of the female genital tract: Secondary | ICD-10-CM | POA: Insufficient documentation

## 2015-02-21 DIAGNOSIS — R0602 Shortness of breath: Secondary | ICD-10-CM | POA: Insufficient documentation

## 2015-02-21 DIAGNOSIS — Z872 Personal history of diseases of the skin and subcutaneous tissue: Secondary | ICD-10-CM | POA: Insufficient documentation

## 2015-02-21 DIAGNOSIS — J985 Diseases of mediastinum, not elsewhere classified: Secondary | ICD-10-CM | POA: Insufficient documentation

## 2015-02-21 DIAGNOSIS — R51 Headache: Secondary | ICD-10-CM | POA: Insufficient documentation

## 2015-02-21 DIAGNOSIS — J9859 Other diseases of mediastinum, not elsewhere classified: Secondary | ICD-10-CM

## 2015-02-21 DIAGNOSIS — R079 Chest pain, unspecified: Secondary | ICD-10-CM | POA: Insufficient documentation

## 2015-02-21 LAB — CBC
HCT: 38.6 % (ref 36.0–46.0)
HEMOGLOBIN: 12.2 g/dL (ref 12.0–15.0)
MCH: 25.7 pg — AB (ref 26.0–34.0)
MCHC: 31.6 g/dL (ref 30.0–36.0)
MCV: 81.3 fL (ref 78.0–100.0)
PLATELETS: 341 10*3/uL (ref 150–400)
RBC: 4.75 MIL/uL (ref 3.87–5.11)
RDW: 12.8 % (ref 11.5–15.5)
WBC: 5.2 10*3/uL (ref 4.0–10.5)

## 2015-02-21 LAB — BASIC METABOLIC PANEL
ANION GAP: 15 (ref 5–15)
BUN: 13 mg/dL (ref 6–23)
CO2: 24 mmol/L (ref 19–32)
Calcium: 9.5 mg/dL (ref 8.4–10.5)
Chloride: 102 mmol/L (ref 96–112)
Creatinine, Ser: 0.54 mg/dL (ref 0.50–1.10)
Glucose, Bld: 97 mg/dL (ref 70–99)
Potassium: 4 mmol/L (ref 3.5–5.1)
Sodium: 141 mmol/L (ref 135–145)

## 2015-02-21 LAB — BRAIN NATRIURETIC PEPTIDE: B NATRIURETIC PEPTIDE 5: 40.9 pg/mL (ref 0.0–100.0)

## 2015-02-21 LAB — I-STAT TROPONIN, ED: Troponin i, poc: 0 ng/mL (ref 0.00–0.08)

## 2015-02-21 MED ORDER — SODIUM CHLORIDE 0.9 % IV BOLUS (SEPSIS)
1000.0000 mL | Freq: Once | INTRAVENOUS | Status: AC
  Administered 2015-02-21: 1000 mL via INTRAVENOUS

## 2015-02-21 MED ORDER — MORPHINE SULFATE 4 MG/ML IJ SOLN
4.0000 mg | Freq: Once | INTRAMUSCULAR | Status: AC
  Administered 2015-02-21: 4 mg via INTRAVENOUS
  Filled 2015-02-21: qty 1

## 2015-02-21 MED ORDER — HYDROCODONE-ACETAMINOPHEN 5-325 MG PO TABS: 1.0000 | ORAL_TABLET | ORAL | Status: AC | PRN

## 2015-02-21 NOTE — ED Provider Notes (Signed)
CSN: [id number]     Arrival date & time 02/21/15  1447 History   First MD Initiated Contact with Patient 02/21/15 1642     Chief Complaint  Patient presents with  . Chest Pain  . Shortness of Breath     (Consider location/radiation/quality/duration/timing/severity/associated sxs/prior Treatment) The history is provided by the patient.  Michele Hale is a 69 y.o. female history of breast cancer in remission, here presenting with shortness of breath, headache, neck pain. The last 4 days or so, she has been having some left-sided headaches. It is intermittent and worse when she turns her head. Associated with some left-sided neck pain. Also has some shortness of breath when she moves or exerts herself. Denies any chest pains and denies any shortness of breath at rest. Denies any fevers or chills or vomiting.     Past Medical History  Diagnosis Date  . Abscess   . IBS (irritable bowel syndrome)   . Breast mass   . Osteoporosis   . Cancer     breast - lt   Past Surgical History  Procedure Laterality Date  . Cesarean section    . Breast lumpectomy      lt breast   Family History  Problem Relation Age of Onset  . Cancer Father     liver  . Cancer Mother     liver   History  Substance Use Topics  . Smoking status: Never Smoker   . Smokeless tobacco: Not on file  . Alcohol Use: No   OB History    No data available     Review of Systems  Respiratory: Positive for shortness of breath.   Cardiovascular: Positive for chest pain.  All other systems reviewed and are negative.     Allergies  Review of patient's allergies indicates no known allergies.  Home Medications   Prior to Admission medications   Medication Sig Start Date End Date Taking? Authorizing Provider  acetaminophen (TYLENOL) 500 MG tablet Take 1,000 mg by mouth every 6 (six) hours as needed. For arthritis pain   Yes Historical Provider, MD  calcium-vitamin D (OSCAL WITH D) 500-200 MG-UNIT per tablet  Take 1 tablet by mouth 2 (two) times daily.   Yes Historical Provider, MD  letrozole (FEMARA) 2.5 MG tablet Take 1 tablet (2.5 mg total) by mouth daily. 08/21/14  Yes Minette Headland, NP  PRESCRIPTION MEDICATION Supportive therapy [place]   Yes Historical Provider, MD  promethazine (PHENERGAN) 25 MG tablet Take 1 tablet (25 mg total) by mouth every 6 (six) hours as needed. For nausea 08/21/14  Yes Minette Headland, NP  cholecalciferol (VITAMIN D) 1000 UNITS tablet Take 1 tablet (1,000 Units total) by mouth daily. Patient not taking: Reported on 02/21/2015 01/23/13   Consuela Mimes, MD  HYDROcodone-acetaminophen (NORCO/VICODIN) 5-325 MG per tablet Take 1 tablet by mouth every 6 (six) hours as needed. Patient not taking: Reported on 02/21/2015 03/11/14   Carvel Getting, NP  loratadine (CLARITIN) 10 MG tablet Take 1 tablet (10 mg total) by mouth daily. Patient not taking: Reported on 02/21/2015 12/25/13   Lorayne Marek, MD   BP 145/65 mmHg  Pulse 81  Temp(Src) 98 F (36.7 C) (Oral)  Resp 18  SpO2 100% Physical Exam  Constitutional: She is oriented to person, place, and time. She appears well-nourished.  HENT:  Head: Normocephalic.  Mouth/Throat: Oropharynx is clear and moist.  No L temporal tenderness but tenderness L posterior scalp   Eyes: Conjunctivae are normal. Pupils  are equal, round, and reactive to light.  Neck: Normal range of motion. Neck supple.  Cardiovascular: Normal rate, regular rhythm and normal heart sounds.   Pulmonary/Chest: Effort normal and breath sounds normal. No respiratory distress. She has no wheezes. She has no rales.  Abdominal: Soft. Bowel sounds are normal. She exhibits no distension. There is no tenderness. There is no rebound.  Musculoskeletal: Normal range of motion. She exhibits no edema or tenderness.  Neurological: She is alert and oriented to person, place, and time. No cranial nerve deficit. Coordination normal.  Nl strength and sensation throughout   Skin:  Skin is warm and dry.  Psychiatric: She has a normal mood and affect. Her behavior is normal. Judgment and thought content normal.  Nursing note and vitals reviewed.   ED Course  Procedures (including critical care time) Labs Review Labs Reviewed  CBC - Abnormal; Notable for the following:    MCH 25.7 (*)    All other components within normal limits  BASIC METABOLIC PANEL  BRAIN NATRIURETIC PEPTIDE  I-STAT TROPOININ, ED    Imaging Review Dg Chest 2 View  02/21/2015   CLINICAL DATA:  Feels a swelling in the left temple. Pain with swelling for 4 days. Left chest pain began today. History of left breast cancer.  EXAM: CHEST  2 VIEW  COMPARISON:  11/05/2010  FINDINGS: Heart size is normal. There is increased density in the superior mediastinum raising the question of adenopathy. Fullness in the anterior mediastinum is identified on the lateral view. Further evaluation is CT of the chest is recommended to exclude malignancy. No suspicious lytic or blastic lesions are identified.  There is no pulmonary edema. No focal consolidations or pleural effusions. There is mild mid thoracic spondylosis. No suspicious lytic or blastic lesions are identified.  IMPRESSION: 1. Superior mediastinal fullness warrants further evaluation. 2. CT of the chest is recommended. Contrast administration is recommended unless contraindicated.   Electronically Signed   By: Nolon Nations M.D.   On: 02/21/2015 15:40   Ct Head W Wo Contrast  02/21/2015   CLINICAL DATA:  Headaches. Mediastinal mass suspected. Assess for metastatic disease.  EXAM: CT HEAD WITHOUT AND WITH CONTRAST  TECHNIQUE: Contiguous axial images were obtained from the base of the skull through the vertex without and with intravenous contrast  CONTRAST:  100 cc Omnipaque 350  COMPARISON:  06/01/2010  FINDINGS: The brain shows mild age related atrophy. There is no evidence of acute infarction, mass lesion, hemorrhage, hydrocephalus or extra-axial collection.  Mild chronic small-vessel change of the white matter is present. Basal ganglia calcification is present. No abnormal enhancement occurs. Visualized sinuses are clear. The calvarium is normal.  IMPRESSION: No evidence of metastatic disease or other acute process. Mild small-vessel disease of the white matter.   Electronically Signed   By: Nelson Chimes M.D.   On: 02/21/2015 18:21   Ct Soft Tissue Neck W Contrast  02/21/2015   CLINICAL DATA:  Headache, neck pain, history of breast cancer  EXAM: CT NECK WITH CONTRAST  TECHNIQUE: Multidetector CT imaging of the neck was performed using the standard protocol following the bolus administration of intravenous contrast.  CONTRAST:  100 cc omnipaque 350  COMPARISON:  None.  FINDINGS: Pharynx and larynx: Within normal limits  Salivary glands: Within normal limits  Thyroid: Numerous sub cm nodules bilaterally. Largest is in the left upper pole and measures 6 mm.  Lymph nodes: Bilateral supraclavicular masses posterior to the carotid arteries consistent with adenopathy. On the  right, lymph node measures 21 x 17 mm. The largest lymph node on the left measures 18 x 17 mm. There are other partially visualized lymph nodes bilaterally. There is also partially visualized mediastinal adenopathy in the anterior mediastinum measuring about 2.7 cm in short axis.  Vascular: No acute findings.  Mild calcifications carotid artery.  Limited intracranial: Negative but only minimally visualized  Visualized orbits: Not visualized  Mastoids and visualized paranasal sinuses: Inflammatory change in both maxillary sinuses right greater than left  Skeleton: No acute findings  Upper chest: Mediastinal adenopathy as described above  IMPRESSION: Evidence of presumably metastatic supraclavicular and superior mediastinal adenopathy.   Electronically Signed   By: Skipper Cliche M.D.   On: 02/21/2015 19:16   Ct Angio Chest Pe W/cm &/or Wo Cm  02/21/2015   CLINICAL DATA:  Shortness of breath.  EXAM:  CT ANGIOGRAPHY CHEST WITH CONTRAST  TECHNIQUE: Multidetector CT imaging of the chest was performed using the standard protocol during bolus administration of intravenous contrast. Multiplanar CT image reconstructions and MIPs were obtained to evaluate the vascular anatomy.  CONTRAST:  100 cc Omnipaque 350  COMPARISON:  Chest x-ray on 02/21/2015  FINDINGS: Heart:  Heart size is normal.  No pericardial effusion.  Vascular structures: The pulmonary arteries are well opacified. There is no acute pulmonary embolus.  Mediastinum/thyroid: There is an anterior mediastinal mass which measures 6.1 x 4.7 x 6.1 cm. Enlarged lymph nodes are identified in the right internal mammary chain, largest measuring 1.4 cm. Enlarged mediastinal lymph nodes are identified. In the superior mediastinum, largest node at the right base of the neck is 1.8 cm. Large is noted the left base of the neck is 1.7 cm.right paratracheal lymph node is 2.1 cm. Prevascular lymph node is 1.1 cm. Right hilar lymph node is 2.4 cm. Small as ago esophageal recess or lymph node measures 8 mm.  Lungs/Airways: The airways are patent. Small linear density is identified within the right middle lobe, possibly representing scar or atelectasis. There are mild dependent changes in the lungs.  Upper abdomen: Subtle low-attenuation lesions are identified within the liver. At least 1 is seen within the left hepatic lobe measuring approximately 1.1 cm. A second is suspected within the posterior segment of the right hepatic lobe measuring 1.0 cm. Findings are suspicious for metastatic disease to the liver. The liver is incompletely imaged on this evaluation of the chest. Possible enlarged lymph nodes within the porta hepatis. This region is not completely evaluated.  Chest wall/osseous structures: There postoperative changes in the left breast. Masslike density is identified within the breast possibly representing the lumpectomy site. Correlation with previous and possible  repeat mammogram recommended to exclude recurrent breast malignancy. No suspicious lytic or blastic lesions are identified. Despite along interface with the mediastinal mass, the sternum is grossly intact. This does not exclude bony involvement however.  Review of the MIP images confirms the above findings.  IMPRESSION: 1. Anterior mediastinal mass measuring 6.1 cm. 2. Enlarged mediastinal and hilar lymph nodes. 3. Suspect hepatic lesions. Consider further evaluation is CT of the abdomen and pelvis with intravenous contrast. 4. Possible lymphadenopathy in the porta hepatis region. This is not completely evaluated. 5. Postoperative changes in the left breast. Malignancy cannot be excluded. See above. 6. No definite osseous metastases identified. However, there is a large interface of the mediastinal mass with the sternum and sternal involvement cannot be excluded. 7. Considerations include metastatic breast cancer, lymphoma, or other metastatic primary.   Electronically Signed  By: Nolon Nations M.D.   On: 02/21/2015 19:40     EKG Interpretation None      MDM   Final diagnoses:  Shortness of breath    Raizy Crear is a 69 y.o. female here with SOB, headaches. Cxr showed possible mediastinal tumor. Consider SVC syndrome vs PE vs mets to brain. Will get CT head and CT angio chest. Will reassess.   9:11 PM CT showed anterior mediastinal mass. There are malignant lymph nodes in porta hepatis and supra clavicular area. Never hypoxic. Comfortable. Pain free. Called Dr. Learta Codding, who will arrange for close follow up and biopsy next week. Updated patient and family.   Wandra Arthurs, MD 02/21/15 2147

## 2015-02-21 NOTE — Discharge Instructions (Signed)
You need to see Dr. Lindi Adie next week to follow up your mass.   Take vicodin as needed for pain.   Return to ER if you have severe pain, trouble breathing, chest pain.

## 2015-02-21 NOTE — Telephone Encounter (Signed)
This RN called patient and spoke with a family member that she should be seen by her PCP. Family member verbalized understanding.

## 2015-02-21 NOTE — ED Notes (Signed)
Delay on lab draw, pt in exray 

## 2015-02-21 NOTE — ED Notes (Signed)
Pt states that she feels swelling to left temple, pain with swallowing x 4 days, chest pain onset today.

## 2015-02-21 NOTE — ED Notes (Signed)
Pt's family at bedside reports pt started to c/o L side h/a x 4 days ago with L cp and SOB.  At present while laying down, pt is denying SOB.  Reports pain in her head is worse with palpation.

## 2015-02-24 ENCOUNTER — Other Ambulatory Visit: Payer: Self-pay | Admitting: *Deleted

## 2015-02-24 ENCOUNTER — Ambulatory Visit (HOSPITAL_BASED_OUTPATIENT_CLINIC_OR_DEPARTMENT_OTHER): Payer: Self-pay | Admitting: Hematology and Oncology

## 2015-02-24 ENCOUNTER — Encounter: Payer: Self-pay | Admitting: Hematology and Oncology

## 2015-02-24 ENCOUNTER — Telehealth: Payer: Self-pay | Admitting: Hematology and Oncology

## 2015-02-24 DIAGNOSIS — J985 Diseases of mediastinum, not elsewhere classified: Secondary | ICD-10-CM

## 2015-02-24 DIAGNOSIS — C50512 Malignant neoplasm of lower-outer quadrant of left female breast: Secondary | ICD-10-CM

## 2015-02-24 DIAGNOSIS — R599 Enlarged lymph nodes, unspecified: Secondary | ICD-10-CM

## 2015-02-24 DIAGNOSIS — C50912 Malignant neoplasm of unspecified site of left female breast: Secondary | ICD-10-CM

## 2015-02-24 DIAGNOSIS — K769 Liver disease, unspecified: Secondary | ICD-10-CM

## 2015-02-24 NOTE — Telephone Encounter (Signed)
MD visit added per 05/02 POF, pt is aware she is in lobby.... KJ

## 2015-02-24 NOTE — Progress Notes (Signed)
Pt's [facility] card expired on 07/05/14 and her financial assistance with the hospital expired on 09/11/14 so I gave her another financial application to reapply for assistance as well as a Medicaid application.

## 2015-02-24 NOTE — Assessment & Plan Note (Signed)
Left breast cancer treated with neoadjuvant antiestrogen therapy followed by lumpectomy 11/10/2010 followed by radiation completed 01/11/2011, antiestrogen therapy with Femara 2.5 mg daily started 02/03/2011.  Radiology review: I discussed the results of CT scan and showed them the pictures. She has an extremely large anterior mediastinal mass in addition to right hilar lymph nodes and liver lesions as well as supraclavicular lymph nodes both sides are very concerning for metastatic disease.  Recommendation: 1. I discussed the case with interventional radiology who are willing to biopsy the anterior mediastinal mass. 2. Send tissue for flow cytometry as well as breast prognostic panel. 3. PET/CT scan for completion of staging.  Return to clinic after biopsy to discuss the results as well as to come up with the treatment plan.

## 2015-02-24 NOTE — Progress Notes (Signed)
Patient Care Team: Tresa Garter, MD as PCP - General (Internal Medicine)  DIAGNOSIS: No matching staging information was found for the patient.  SUMMARY OF ONCOLOGIC HISTORY:   Breast cancer, left breast   04/06/2010 - 10/26/2010 Anti-estrogen oral therapy Neoadjuvant antiestrogen therapy   11/10/2010 Surgery Left breast lumpectomy: Invasive ductal carcinoma 2 cm, grade 3, angiolymphatic invasion present, 6 lymph nodes negative, ER 99%, PR 90%, Ki-67 20%, HER-2 negative ratio 1.22   12/01/2010 - 01/11/2011 Radiation Therapy Adjuvant radiation therapy   02/03/2011 -  Anti-estrogen oral therapy Femara 2.5 mg daily   02/22/2015 Imaging CT chest revealed 6 cm mediastinal mass, right hilar lymph nodes, base of neck lesions in both right and left, subtle liver metastases, periportal lymph nodes    CHIEF COMPLIANT: Follow-up of recent CT scan of the chest done in the emergency room  INTERVAL HISTORY: Michele Hale is a 69 year old lady with above-mentioned history of breast cancer diagnosed in 2011 treated with neoadjuvant antiestrogen therapy followed by surgery radiation and is currently on Femara since April 2012. She presented to the emergency room with a 3-4 day history of shortness of breath and was found to have on a CT chest bulky tumors especially the one in the anterior mediastinum measuring 6.1 cm in addition to right hilar lymph nodes and periportal lymph nodes, liver lesions, bilateral supraclavicular lymph nodes. She came by to her clinic for an urgent follow-up of this finding. She is accompanied by her son who is a IT trainer in Sport and exercise psychologist. The patient her family do not want to involve any other personnel for translation purposes.  REVIEW OF SYSTEMS:   Constitutional: Denies fevers, chills or abnormal weight loss Eyes: Denies blurriness of vision Ears, nose, mouth, throat, and face: Denies mucositis or sore throat Respiratory: Shortness of breath Cardiovascular: Denies  palpitation, chest discomfort or lower extremity swelling Gastrointestinal:  Denies nausea, heartburn or change in bowel habits Skin: Denies abnormal skin rashes Lymphatics: Denies new lymphadenopathy or easy bruising Neurological:Denies numbness, tingling or new weaknesses Behavioral/Psych: Mood is stable, no new changes   All other systems were reviewed with the patient and are negative.  I have reviewed the past medical history, past surgical history, social history and family history with the patient and they are unchanged from previous note.  ALLERGIES:  has No Known Allergies.  MEDICATIONS:  Current Outpatient Prescriptions  Medication Sig Dispense Refill  . acetaminophen (TYLENOL) 500 MG tablet Take 1,000 mg by mouth every 6 (six) hours as needed. For arthritis pain    . calcium-vitamin D (OSCAL WITH D) 500-200 MG-UNIT per tablet Take 1 tablet by mouth 2 (two) times daily.    . cholecalciferol (VITAMIN D) 1000 UNITS tablet Take 1 tablet (1,000 Units total) by mouth daily. 30 tablet 12  . HYDROcodone-acetaminophen (NORCO/VICODIN) 5-325 MG per tablet Take 1-2 tablets by mouth every 4 (four) hours as needed. 10 tablet 0  . letrozole (FEMARA) 2.5 MG tablet Take 1 tablet (2.5 mg total) by mouth daily. 30 tablet 1  . loratadine (CLARITIN) 10 MG tablet Take 1 tablet (10 mg total) by mouth daily. 30 tablet 3  . PRESCRIPTION MEDICATION Supportive therapy [facility]    . promethazine (PHENERGAN) 25 MG tablet Take 1 tablet (25 mg total) by mouth every 6 (six) hours as needed. For nausea 180 tablet 0   No current facility-administered medications for this visit.    PHYSICAL EXAMINATION: ECOG PERFORMANCE STATUS: 1 - Symptomatic but completely ambulatory  Filed Vitals:  02/24/15 1038  BP: 126/66  Pulse: 71  Temp: 98.1 F (36.7 C)  Resp: 18   Filed Weights   02/24/15 1038  Weight: 143 lb 6 oz (65.034 kg)    GENERAL:alert, no distress and comfortable SKIN: skin color, texture, turgor  are normal, no rashes or significant lesions EYES: normal, Conjunctiva are pink and non-injected, sclera clear OROPHARYNX:no exudate, no erythema and lips, buccal mucosa, and tongue normal  NECK: supple, thyroid normal size, non-tender, without nodularity LYMPH:  no palpable lymphadenopathy in the cervical, axillary or inguinal LUNGS: clear to auscultation and percussion with normal breathing effort HEART: regular rate & rhythm and no murmurs and no lower extremity edema ABDOMEN:abdomen soft, non-tender and normal bowel sounds Musculoskeletal:no cyanosis of digits and no clubbing  NEURO: alert & oriented x 3 with fluent speech, no focal motor/sensory deficits  LABORATORY DATA:  I have reviewed the data as listed   Chemistry      Component Value Date/Time   NA 141 02/21/2015 1530   NA 143 05/10/2014 1509   K 4.0 02/21/2015 1530   K 4.1 05/10/2014 1509   CL 102 02/21/2015 1530   CL 104 01/23/2013 1420   CO2 24 02/21/2015 1530   CO2 29 05/10/2014 1509   BUN 13 02/21/2015 1530   BUN 9.0 05/10/2014 1509   CREATININE 0.54 02/21/2015 1530   CREATININE 0.8 05/10/2014 1509      Component Value Date/Time   CALCIUM 9.5 02/21/2015 1530   CALCIUM 9.5 05/10/2014 1509   ALKPHOS 51 05/10/2014 1509   ALKPHOS 63 02/07/2012 1312   AST 23 05/10/2014 1509   AST 17 02/07/2012 1312   ALT 15 05/10/2014 1509   ALT 15 02/07/2012 1312   BILITOT 0.40 05/10/2014 1509   BILITOT 0.3 02/07/2012 1312       Lab Results  Component Value Date   WBC 5.2 02/21/2015   HGB 12.2 02/21/2015   HCT 38.6 02/21/2015   MCV 81.3 02/21/2015   PLT 341 02/21/2015   NEUTROABS 1.8 05/10/2014     RADIOGRAPHIC STUDIES: I have personally reviewed the radiology reports and agreed with their findings. No results found.   ASSESSMENT & PLAN:  Breast cancer, left breast Left breast cancer treated with neoadjuvant antiestrogen therapy followed by lumpectomy 11/10/2010 followed by radiation completed 01/11/2011,  antiestrogen therapy with Femara 2.5 mg daily started 02/03/2011.  Radiology review: I discussed the results of CT scan and showed them the pictures. She has an extremely large anterior mediastinal mass in addition to right hilar lymph nodes and liver lesions as well as supraclavicular lymph nodes both sides are very concerning for metastatic disease.  Recommendation: 1. I discussed the case with interventional radiology who are willing to biopsy the anterior mediastinal mass. 2. Send tissue for flow cytometry as well as breast prognostic panel. 3. PET/CT scan for completion of staging.  Return to clinic after biopsy to discuss the results as well as to come up with the treatment plan.     Orders Placed This Encounter  Procedures  . CT Biopsy    D/W Dr.Hassel to buiopsy the anterior mediastinal mass. Send for Flow cytometry and Breast prognostic markers    Standing Status: Future     Number of Occurrences:      Standing Expiration Date: 03/30/2016    Order Specific Question:  Reason for Exam (SYMPTOM  OR DIAGNOSIS REQUIRED)    Answer:  Metastatic cancer with H/O prior breast cancer    Order Specific Question:  Preferred imaging location?    Answer:  [facility]   The patient has a good understanding of the overall plan. she agrees with it. she will call with any problems that may develop before the next visit here.   Rulon Eisenmenger, MD

## 2015-02-25 ENCOUNTER — Ambulatory Visit: Payer: No Typology Code available for payment source | Admitting: Hematology and Oncology

## 2015-02-26 ENCOUNTER — Ambulatory Visit: Payer: Self-pay | Attending: Internal Medicine

## 2015-02-28 ENCOUNTER — Encounter (HOSPITAL_COMMUNITY)
Admission: RE | Admit: 2015-02-28 | Discharge: 2015-02-28 | Disposition: A | Discharge status: Home / Self Care | Payer: Self-pay | Source: Ambulatory Visit | Attending: Hematology and Oncology | Admitting: Hematology and Oncology

## 2015-02-28 DIAGNOSIS — R591 Generalized enlarged lymph nodes: Secondary | ICD-10-CM | POA: Insufficient documentation

## 2015-02-28 DIAGNOSIS — C50912 Malignant neoplasm of unspecified site of left female breast: Secondary | ICD-10-CM | POA: Insufficient documentation

## 2015-02-28 DIAGNOSIS — C787 Secondary malignant neoplasm of liver and intrahepatic bile duct: Secondary | ICD-10-CM | POA: Insufficient documentation

## 2015-02-28 LAB — GLUCOSE, CAPILLARY: GLUCOSE-CAPILLARY: 91 mg/dL (ref 70–99)

## 2015-02-28 MED ORDER — FLUDEOXYGLUCOSE F - 18 (FDG) INJECTION
7.1000 | Freq: Once | INTRAVENOUS | Status: AC | PRN
  Administered 2015-02-28: 7.1 via INTRAVENOUS

## 2015-03-03 ENCOUNTER — Other Ambulatory Visit: Payer: Self-pay | Admitting: Radiology

## 2015-03-04 ENCOUNTER — Encounter (HOSPITAL_COMMUNITY): Payer: Self-pay

## 2015-03-04 ENCOUNTER — Ambulatory Visit (HOSPITAL_COMMUNITY)
Admission: RE | Admit: 2015-03-04 | Discharge: 2015-03-04 | Disposition: A | Discharge status: Home / Self Care | Payer: Self-pay | Source: Ambulatory Visit | Attending: Hematology and Oncology | Admitting: Hematology and Oncology

## 2015-03-04 DIAGNOSIS — M810 Age-related osteoporosis without current pathological fracture: Secondary | ICD-10-CM | POA: Insufficient documentation

## 2015-03-04 DIAGNOSIS — R0602 Shortness of breath: Secondary | ICD-10-CM | POA: Insufficient documentation

## 2015-03-04 DIAGNOSIS — Z79811 Long term (current) use of aromatase inhibitors: Secondary | ICD-10-CM | POA: Insufficient documentation

## 2015-03-04 DIAGNOSIS — Z808 Family history of malignant neoplasm of other organs or systems: Secondary | ICD-10-CM | POA: Insufficient documentation

## 2015-03-04 DIAGNOSIS — J9859 Other diseases of mediastinum, not elsewhere classified: Secondary | ICD-10-CM | POA: Insufficient documentation

## 2015-03-04 DIAGNOSIS — C50912 Malignant neoplasm of unspecified site of left female breast: Secondary | ICD-10-CM | POA: Insufficient documentation

## 2015-03-04 DIAGNOSIS — C771 Secondary and unspecified malignant neoplasm of intrathoracic lymph nodes: Secondary | ICD-10-CM | POA: Insufficient documentation

## 2015-03-04 DIAGNOSIS — K589 Irritable bowel syndrome without diarrhea: Secondary | ICD-10-CM | POA: Insufficient documentation

## 2015-03-04 LAB — CBC WITH DIFFERENTIAL/PLATELET
Basophils Absolute: 0 10*3/uL (ref 0.0–0.1)
Basophils Relative: 1 % (ref 0–1)
EOS ABS: 0.2 10*3/uL (ref 0.0–0.7)
Eosinophils Relative: 4 % (ref 0–5)
HCT: 37.5 % (ref 36.0–46.0)
Hemoglobin: 11.9 g/dL — ABNORMAL LOW (ref 12.0–15.0)
Lymphocytes Relative: 27 % (ref 12–46)
Lymphs Abs: 1 10*3/uL (ref 0.7–4.0)
MCH: 25.8 pg — AB (ref 26.0–34.0)
MCHC: 31.7 g/dL (ref 30.0–36.0)
MCV: 81.3 fL (ref 78.0–100.0)
MONO ABS: 0.3 10*3/uL (ref 0.1–1.0)
Monocytes Relative: 8 % (ref 3–12)
Neutro Abs: 2.3 10*3/uL (ref 1.7–7.7)
Neutrophils Relative %: 60 % (ref 43–77)
Platelets: 289 10*3/uL (ref 150–400)
RBC: 4.61 MIL/uL (ref 3.87–5.11)
RDW: 12.9 % (ref 11.5–15.5)
WBC: 3.8 10*3/uL — ABNORMAL LOW (ref 4.0–10.5)

## 2015-03-04 LAB — PROTIME-INR
INR: 1.07 (ref 0.00–1.49)
Prothrombin Time: 14.1 seconds (ref 11.6–15.2)

## 2015-03-04 LAB — APTT: aPTT: 32 seconds (ref 24–37)

## 2015-03-04 MED ORDER — MIDAZOLAM HCL 2 MG/2ML IJ SOLN
INTRAMUSCULAR | Status: AC | PRN
  Administered 2015-03-04 (×2): 0.5 mg via INTRAVENOUS

## 2015-03-04 MED ORDER — SODIUM CHLORIDE 0.9 % IV SOLN
INTRAVENOUS | Status: DC
  Administered 2015-03-04: 10:00:00 via INTRAVENOUS

## 2015-03-04 MED ORDER — FENTANYL CITRATE (PF) 100 MCG/2ML IJ SOLN
INTRAMUSCULAR | Status: AC | PRN
  Administered 2015-03-04 (×2): 25 ug via INTRAVENOUS

## 2015-03-04 MED ORDER — FENTANYL CITRATE (PF) 100 MCG/2ML IJ SOLN
INTRAMUSCULAR | Status: AC
  Filled 2015-03-04: qty 4

## 2015-03-04 MED ORDER — HYDROCODONE-ACETAMINOPHEN 5-325 MG PO TABS: 1.0000 | ORAL_TABLET | ORAL | Status: DC | PRN

## 2015-03-04 MED ORDER — MIDAZOLAM HCL 2 MG/2ML IJ SOLN
INTRAMUSCULAR | Status: AC
  Filled 2015-03-04: qty 4

## 2015-03-04 NOTE — Discharge Instructions (Signed)
Conscious Sedation, Adult, Care After Refer to this sheet in the next few weeks. These instructions provide you with information on caring for yourself after your procedure. Your health care provider may also give you more specific instructions. Your treatment has been planned according to current medical practices, but problems sometimes occur. Call your health care provider if you have any problems or questions after your procedure. WHAT TO EXPECT AFTER THE PROCEDURE  After your procedure:  You may feel sleepy, clumsy, and have poor balance for several hours.  Vomiting may occur if you eat too soon after the procedure. HOME CARE INSTRUCTIONS  Do not participate in any activities where you could become injured for at least 24 hours. Do not:  Drive.  Swim.  Ride a bicycle.  Operate heavy machinery.  Cook.  Use power tools.  Climb ladders.  Work from a high place.  Do not make important decisions or sign legal documents until you are improved.  If you vomit, drink water, juice, or soup when you can drink without vomiting. Make sure you have little or no nausea before eating solid foods.  Only take over-the-counter or prescription medicines for pain, discomfort, or fever as directed by your health care provider.  Make sure you and your family fully understand everything about the medicines given to you, including what side effects may occur.  You should not drink alcohol, take sleeping pills, or take medicines that cause drowsiness for at least 24 hours.  If you smoke, do not smoke without supervision.  If you are feeling better, you may resume normal activities 24 hours after you were sedated.  Keep all appointments with your health care provider. SEEK MEDICAL CARE IF:  Your skin is pale or bluish in color.  You continue to feel nauseous or vomit.  Your pain is getting worse and is not helped by medicine.  You have bleeding or swelling.  You are still sleepy or  feeling clumsy after 24 hours. SEEK IMMEDIATE MEDICAL CARE IF:  You develop a rash.  You have difficulty breathing.  You develop any type of allergic problem.  You have a fever. MAKE SURE YOU:  Understand these instructions.  Will watch your condition.  Will get help right away if you are not doing well or get worse. Document Released: 08/01/2013 Document Reviewed: 08/01/2013 [facility] Patient Information 2015 [place], [place]. This information is not intended to replace advice given to you by your health care provider. Make sure you discuss any questions you have with your health care provider.  Needle Biopsy Care After These instructions give you information on caring for yourself after your procedure. Your doctor may also give you more specific instructions. Call your doctor if you have any problems or questions after your procedure. HOME CARE Rest for 4 hours after your biopsy, except for getting up to go to the bathroom or as told. Keep the places where the needles were put in clean and dry. Do not put powder or lotion on the sites. Do not shower until 24 hours after the test. Remove all bandages (dressings) before showering. Remove all bandages at least once every day. Gently clean the sites with soap and water. Keep putting a new bandage on until the skin is closed. Finding out the results of your test Ask your doctor when your test results will be ready. Make sure you follow up and get the test results. GET HELP RIGHT AWAY IF:  You have shortness of breath or trouble breathing. You  have pain or cramping in your belly (abdomen). You feel sick to your stomach (nauseous) or throw up (vomit). Any of the places where the needles were put in: Are puffy (swollen) or red. Are sore or hot to the touch. Are draining yellowish-white fluid (pus). Are bleeding after 10 minutes of pressing down on the site. Have someone keep pressing on any place that is bleeding until you see a  doctor. You have any unusual pain that will not stop. You have a fever. If you go to the emergency room, tell the nurse that you had a biopsy. Take this paper with you to show the nurse. MAKE SURE YOU:  Understand these instructions. Will watch your condition. Will get help right away if you are not doing well or get worse. Document Released: 09/23/2008 Document Revised: 01/03/2012 Document Reviewed: 09/23/2008 [facility] Patient Information 2015 [place], [place]. This information is not intended to replace advice given to you by your health care provider. Make sure you discuss any questions you have with your health care provider.

## 2015-03-04 NOTE — Progress Notes (Signed)
Interpreter is here at bedside, Michele Hale.

## 2015-03-04 NOTE — Procedures (Signed)
CT core biopsy mediastinal mass 18g x4 ot surg path No complication No blood loss. See complete dictation in [facility].

## 2015-03-04 NOTE — H&P (Signed)
Chief Complaint: Short of breath Mediastinal mass  Referring Physician(s): Gudena,Vinay  History of Present Illness: Michele Hale is a 69 y.o. female   Pt with Hx Breast Ca 2011 Followed with Dr Lindi Adie Presented with SOB 02/28/15 Work up revealed Mediastinal mass; supraclavicular lymphadenopathy; liver lesion Request for mediastinal mass Now scheduled for same   Past Medical History  Diagnosis Date  . Abscess   . IBS (irritable bowel syndrome)   . Breast mass   . Osteoporosis   . Cancer     breast - lt    Past Surgical History  Procedure Laterality Date  . Cesarean section    . Breast lumpectomy      lt breast    Allergies: Review of patient's allergies indicates no known allergies.  Medications: Prior to Admission medications   Medication Sig Start Date End Date Taking? Authorizing Provider  acetaminophen (TYLENOL) 500 MG tablet Take 1,000 mg by mouth every 6 (six) hours as needed. For arthritis pain   Yes Historical Provider, MD  calcium-vitamin D (OSCAL WITH D) 500-200 MG-UNIT per tablet Take 1 tablet by mouth 2 (two) times daily.   Yes Historical Provider, MD  cholecalciferol (VITAMIN D) 1000 UNITS tablet Take 1 tablet (1,000 Units total) by mouth daily. 01/23/13  Yes Consuela Mimes, MD  HYDROcodone-acetaminophen (NORCO/VICODIN) 5-325 MG per tablet Take 1-2 tablets by mouth every 4 (four) hours as needed. 02/21/15  Yes Wandra Arthurs, MD  letrozole [facility]) 2.5 MG tablet Take 1 tablet (2.5 mg total) by mouth daily. 08/21/14  Yes Minette Headland, NP  promethazine (PHENERGAN) 25 MG tablet Take 1 tablet (25 mg total) by mouth every 6 (six) hours as needed. For nausea 08/21/14  Yes Minette Headland, NP  loratadine (CLARITIN) 10 MG tablet Take 1 tablet (10 mg total) by mouth daily. Patient not taking: Reported on 03/04/2015 12/25/13   Lorayne Marek, MD  PRESCRIPTION MEDICATION Supportive therapy [place]    Historical Provider, MD     Family History  Problem  Relation Age of Onset  . Cancer Father     liver  . Cancer Mother     liver    History   Social History  . Marital Status: Single    Spouse Name: N/A  . Number of Children: N/A  . Years of Education: N/A   Social History Main Topics  . Smoking status: Never Smoker   . Smokeless tobacco: Not on file  . Alcohol Use: No  . Drug Use: No  . Sexual Activity: Not Currently   Other Topics Concern  . None   Social History Narrative     Review of Systems: A 12 point ROS discussed and pertinent positives are indicated in the HPI above.  All other systems are negative.  Review of Systems  Constitutional: Positive for activity change. Negative for appetite change and unexpected weight change.  Respiratory: Positive for cough, chest tightness and shortness of breath.   Cardiovascular: Negative for chest pain.  Gastrointestinal: Negative for abdominal pain.  Psychiatric/Behavioral: Negative for behavioral problems and confusion.    Vital Signs: BP 143/74 mmHg  Pulse 73  Temp(Src) 98.4 F (36.9 C) (Oral)  Resp 16  SpO2 100%  Physical Exam  Constitutional: She is oriented to person, place, and time. She appears well-nourished.  Pt does not speak [place] Son in room' Arabic interpreter in room  Cardiovascular: Normal rate, regular rhythm and normal heart sounds.   No murmur heard. Pulmonary/Chest: Effort normal and  breath sounds normal. She has no wheezes.  Abdominal: Soft. Bowel sounds are normal. There is no tenderness.  Musculoskeletal: Normal range of motion.  Neurological: She is alert and oriented to person, place, and time.  Skin: Skin is warm and dry.  Psychiatric: She has a normal mood and affect. Her behavior is normal. Judgment and thought content normal.  Consent gained through interpreter Pt and son aware of procedure  Son signed consent  Nursing note and vitals reviewed.   Mallampati Score:  MD Evaluation Airway: WNL Heart: WNL Abdomen: WNL Chest/  Lungs: WNL ASA  Classification: 3 Mallampati/Airway Score: One  Imaging: Dg Chest 2 View  02/21/2015   CLINICAL DATA:  Feels a swelling in the left temple. Pain with swelling for 4 days. Left chest pain began today. History of left breast cancer.  EXAM: CHEST  2 VIEW  COMPARISON:  11/05/2010  FINDINGS: Heart size is normal. There is increased density in the superior mediastinum raising the question of adenopathy. Fullness in the anterior mediastinum is identified on the lateral view. Further evaluation is CT of the chest is recommended to exclude malignancy. No suspicious lytic or blastic lesions are identified.  There is no pulmonary edema. No focal consolidations or pleural effusions. There is mild mid thoracic spondylosis. No suspicious lytic or blastic lesions are identified.  IMPRESSION: 1. Superior mediastinal fullness warrants further evaluation. 2. CT of the chest is recommended. Contrast administration is recommended unless contraindicated.   Electronically Signed   By: Nolon Nations M.D.   On: 02/21/2015 15:40   Ct Head W Wo Contrast  02/21/2015   CLINICAL DATA:  Headaches. Mediastinal mass suspected. Assess for metastatic disease.  EXAM: CT HEAD WITHOUT AND WITH CONTRAST  TECHNIQUE: Contiguous axial images were obtained from the base of the skull through the vertex without and with intravenous contrast  CONTRAST:  100 cc Omnipaque 350  COMPARISON:  06/01/2010  FINDINGS: The brain shows mild age related atrophy. There is no evidence of acute infarction, mass lesion, hemorrhage, hydrocephalus or extra-axial collection. Mild chronic small-vessel change of the white matter is present. Basal ganglia calcification is present. No abnormal enhancement occurs. Visualized sinuses are clear. The calvarium is normal.  IMPRESSION: No evidence of metastatic disease or other acute process. Mild small-vessel disease of the white matter.   Electronically Signed   By: Nelson Chimes M.D.   On: 02/21/2015 18:21    Ct Soft Tissue Neck W Contrast  02/21/2015   CLINICAL DATA:  Headache, neck pain, history of breast cancer  EXAM: CT NECK WITH CONTRAST  TECHNIQUE: Multidetector CT imaging of the neck was performed using the standard protocol following the bolus administration of intravenous contrast.  CONTRAST:  100 cc omnipaque 350  COMPARISON:  None.  FINDINGS: Pharynx and larynx: Within normal limits  Salivary glands: Within normal limits  Thyroid: Numerous sub cm nodules bilaterally. Largest is in the left upper pole and measures 6 mm.  Lymph nodes: Bilateral supraclavicular masses posterior to the carotid arteries consistent with adenopathy. On the right, lymph node measures 21 x 17 mm. The largest lymph node on the left measures 18 x 17 mm. There are other partially visualized lymph nodes bilaterally. There is also partially visualized mediastinal adenopathy in the anterior mediastinum measuring about 2.7 cm in short axis.  Vascular: No acute findings.  Mild calcifications carotid artery.  Limited intracranial: Negative but only minimally visualized  Visualized orbits: Not visualized  Mastoids and visualized paranasal sinuses: Inflammatory change in both  maxillary sinuses right greater than left  Skeleton: No acute findings  Upper chest: Mediastinal adenopathy as described above  IMPRESSION: Evidence of presumably metastatic supraclavicular and superior mediastinal adenopathy.   Electronically Signed   By: Skipper Cliche M.D.   On: 02/21/2015 19:16   Ct Angio Chest Pe W/cm &/or Wo Cm  02/21/2015   CLINICAL DATA:  Shortness of breath.  EXAM: CT ANGIOGRAPHY CHEST WITH CONTRAST  TECHNIQUE: Multidetector CT imaging of the chest was performed using the standard protocol during bolus administration of intravenous contrast. Multiplanar CT image reconstructions and MIPs were obtained to evaluate the vascular anatomy.  CONTRAST:  100 cc Omnipaque 350  COMPARISON:  Chest x-ray on 02/21/2015  FINDINGS: Heart:  Heart size is  normal.  No pericardial effusion.  Vascular structures: The pulmonary arteries are well opacified. There is no acute pulmonary embolus.  Mediastinum/thyroid: There is an anterior mediastinal mass which measures 6.1 x 4.7 x 6.1 cm. Enlarged lymph nodes are identified in the right internal mammary chain, largest measuring 1.4 cm. Enlarged mediastinal lymph nodes are identified. In the superior mediastinum, largest node at the right base of the neck is 1.8 cm. Large is noted the left base of the neck is 1.7 cm.right paratracheal lymph node is 2.1 cm. Prevascular lymph node is 1.1 cm. Right hilar lymph node is 2.4 cm. Small as ago esophageal recess or lymph node measures 8 mm.  Lungs/Airways: The airways are patent. Small linear density is identified within the right middle lobe, possibly representing scar or atelectasis. There are mild dependent changes in the lungs.  Upper abdomen: Subtle low-attenuation lesions are identified within the liver. At least 1 is seen within the left hepatic lobe measuring approximately 1.1 cm. A second is suspected within the posterior segment of the right hepatic lobe measuring 1.0 cm. Findings are suspicious for metastatic disease to the liver. The liver is incompletely imaged on this evaluation of the chest. Possible enlarged lymph nodes within the porta hepatis. This region is not completely evaluated.  Chest wall/osseous structures: There postoperative changes in the left breast. Masslike density is identified within the breast possibly representing the lumpectomy site. Correlation with previous and possible repeat mammogram recommended to exclude recurrent breast malignancy. No suspicious lytic or blastic lesions are identified. Despite along interface with the mediastinal mass, the sternum is grossly intact. This does not exclude bony involvement however.  Review of the MIP images confirms the above findings.  IMPRESSION: 1. Anterior mediastinal mass measuring 6.1 cm. 2. Enlarged  mediastinal and hilar lymph nodes. 3. Suspect hepatic lesions. Consider further evaluation is CT of the abdomen and pelvis with intravenous contrast. 4. Possible lymphadenopathy in the porta hepatis region. This is not completely evaluated. 5. Postoperative changes in the left breast. Malignancy cannot be excluded. See above. 6. No definite osseous metastases identified. However, there is a large interface of the mediastinal mass with the sternum and sternal involvement cannot be excluded. 7. Considerations include metastatic breast cancer, lymphoma, or other metastatic primary.   Electronically Signed   By: Nolon Nations M.D.   On: 02/21/2015 19:40   Nm Pet Image Restag (ps) Skull Base To Thigh  02/28/2015   CLINICAL DATA:  Subsequent treatment strategy for breast cancer. New supraclavicular and mediastinal lymphadenopathy along with liver lesions.  EXAM: NUCLEAR MEDICINE PET SKULL BASE TO THIGH  TECHNIQUE: The 7.1 mCi F-18 FDG was injected intravenously. Full-ring PET imaging was performed from the skull base to thigh after the radiotracer. CT data  was obtained and used for attenuation correction and anatomic localization.  FASTING BLOOD GLUCOSE:  Value: 91 mg/dl  COMPARISON:  CT scans 02/21/2015  FINDINGS: NECK  Bulky bilateral supraclavicular lymphadenopathy as demonstrated on the recent CT scans. Marked hypermetabolism consistent with neoplasm. The right supraclavicular node has an SUV max of 17.7. The left-sided nodal mass is 11.9.  CHEST  Surgical changes involving the left breast from a previous lumpectomy and radiation therapy. No findings for chest wall recurrence and no axillary lymphadenopathy. Bulky anterior mediastinal mass is markedly hypermetabolic with SUV max of 26.3. There are several other hypermetabolic mediastinal lymph nodes with SUV max ranging from 6-13. No definite pulmonary nodules.  ABDOMEN/PELVIS  There are 3 hypermetabolic liver lesions.  9 mm lesion in the hepatic dome in segment  8 has an SUV max of 4.2  A 16 mm lesion in segment 3 has an SUV max of 4.8  A 12 mm lesion in segment 6 has an SUV max of 5.3.  The adrenal glands are unremarkable.  No metastatic lymphadenopathy.  No findings suspicious for osseous metastatic disease.  SKELETON  No focal hypermetabolic activity to suggest skeletal metastasis.  IMPRESSION: 1. Bulky bilateral hypermetabolic supraclavicular and mediastinal lymphadenopathy consistent with neoplasm/metastatic disease. 2. Three hepatic metastatic lesions. 3. No findings for pulmonary metastatic disease or osseous metastatic disease.   Electronically Signed   By: Marijo Sanes M.D.   On: 02/28/2015 10:34    Labs:  CBC:  Recent Labs  05/10/14 1509 02/21/15 1530 03/04/15 0936  WBC 3.6* 5.2 3.8*  HGB 11.7 12.2 11.9*  HCT 36.1 38.6 37.5  PLT 269 341 289    COAGS:  Recent Labs  03/04/15 0936  INR 1.07  APTT 32    BMP:  Recent Labs  05/10/14 1509 02/21/15 1530  NA 143 141  K 4.1 4.0  CL  --  102  CO2 29 24  GLUCOSE 113 97  BUN 9.0 13  CALCIUM 9.5 9.5  CREATININE 0.8 0.54  GFRNONAA  --  >90  GFRAA  --  >90    LIVER FUNCTION TESTS:  Recent Labs  05/10/14 1509  BILITOT 0.40  AST 23  ALT 15  ALKPHOS 51  PROT 7.3  ALBUMIN 3.8    TUMOR MARKERS: No results for input(s): AFPTM, CEA, CA199, CHROMGRNA in the last 8760 hours.  Assessment and Plan:  Hx Breast Ca 2011 Developed shortness of breath; cough 02/28/15 CT revealed mediastinal mass; [place] LAN and liver lesions Now scheduled for mediastinal mass bx Using an interpreter, Risks and Benefits discussed with the patient and son including, but not limited to bleeding, infection, damage to adjacent structures or low yield requiring additional tests. All of the patient's questions were answered, patient and son are agreeable to proceed. Consent signed and in chart.   Thank you for this interesting consult.  I greatly enjoyed meeting Michele Hale and look forward to  participating in their care.  Signed: Charday Capetillo A 03/04/2015, 10:24 AM   I spent a total of  20 Minutes   in face to face in clinical consultation, greater than 50% of which was counseling/coordinating care for mediastinal mass bx

## 2015-03-05 ENCOUNTER — Telehealth: Payer: Self-pay | Admitting: *Deleted

## 2015-03-05 ENCOUNTER — Other Ambulatory Visit: Payer: Self-pay | Admitting: *Deleted

## 2015-03-05 NOTE — Telephone Encounter (Signed)
Patient and family member would like to know the results of recent biopsy.

## 2015-03-06 ENCOUNTER — Telehealth: Payer: Self-pay | Admitting: Hematology and Oncology

## 2015-03-06 NOTE — Telephone Encounter (Signed)
Called and spoke with patients son and he is aware of his appointment

## 2015-03-06 NOTE — Telephone Encounter (Signed)
Returned sons call again as he request to move the appointment to the am  anne

## 2015-03-11 NOTE — Assessment & Plan Note (Signed)
Left breast cancer treated with neoadjuvant antiestrogen therapy followed by lumpectomy 11/10/2010 followed by radiation completed 01/11/2011, antiestrogen therapy with Femara 2.5 mg daily started 02/03/2011 PET/CT 03/04/15: Bulky metastatic disease in supraclavicular and Mediastinal LN and 3 hepatic lesions. Biopsy of the mediastinal LN: Metastatic Breast cancer ER negative, Breast prognostic panel Pending  Pathology and Radiology review: Discussed the results of PET scan and Biopsy results.  Recommendation: 1. Chemotherapy with Halaven days 1, 8 Q 3 weeks 2. Chemo counseling: Discussed the risks of Halaven including risk of infection, anemia, fatigue, hair loss, nausea and constipation or diarrhea.  Patient will need Port, chemo class and then start chemo

## 2015-03-12 ENCOUNTER — Telehealth: Payer: Self-pay | Admitting: *Deleted

## 2015-03-12 ENCOUNTER — Ambulatory Visit (HOSPITAL_BASED_OUTPATIENT_CLINIC_OR_DEPARTMENT_OTHER): Payer: Self-pay | Admitting: Hematology and Oncology

## 2015-03-12 ENCOUNTER — Ambulatory Visit: Payer: Self-pay | Admitting: Hematology and Oncology

## 2015-03-12 ENCOUNTER — Telehealth: Payer: Self-pay | Admitting: Hematology and Oncology

## 2015-03-12 VITALS — BP 139/72 | HR 77 | Temp 97.5°F | Resp 18 | Ht 65.0 in | Wt 141.7 lb

## 2015-03-12 DIAGNOSIS — C50912 Malignant neoplasm of unspecified site of left female breast: Secondary | ICD-10-CM

## 2015-03-12 DIAGNOSIS — K769 Liver disease, unspecified: Secondary | ICD-10-CM

## 2015-03-12 DIAGNOSIS — C50512 Malignant neoplasm of lower-outer quadrant of left female breast: Secondary | ICD-10-CM

## 2015-03-12 DIAGNOSIS — C778 Secondary and unspecified malignant neoplasm of lymph nodes of multiple regions: Secondary | ICD-10-CM

## 2015-03-12 MED ORDER — PROCHLORPERAZINE MALEATE 10 MG PO TABS: 10.0000 mg | ORAL_TABLET | Freq: Four times a day (QID) | ORAL | Status: DC | PRN

## 2015-03-12 MED ORDER — LIDOCAINE-PRILOCAINE 2.5-2.5 % EX CREA: TOPICAL_CREAM | CUTANEOUS | Status: DC

## 2015-03-12 MED ORDER — ONDANSETRON HCL 8 MG PO TABS: 8.0000 mg | ORAL_TABLET | Freq: Two times a day (BID) | ORAL | Status: DC

## 2015-03-12 NOTE — Telephone Encounter (Signed)
Gave avs & calendar for May. Sent message to schedule treatment. °

## 2015-03-12 NOTE — Telephone Encounter (Signed)
Can you take care of this? Thx

## 2015-03-12 NOTE — Progress Notes (Signed)
Christy in Pathology will add breast prognostic panel to mediastinal LN bx

## 2015-03-12 NOTE — Progress Notes (Signed)
Patient Care Team: Tresa Garter, MD as PCP - General (Internal Medicine)  DIAGNOSIS: No matching staging information was found for the patient.  SUMMARY OF ONCOLOGIC HISTORY:   Breast cancer, left breast   04/06/2010 - 10/26/2010 Anti-estrogen oral therapy Neoadjuvant antiestrogen therapy   11/10/2010 Surgery Left breast lumpectomy: Invasive ductal carcinoma 2 cm, grade 3, angiolymphatic invasion present, 6 lymph nodes negative, ER 99%, PR 90%, Ki-67 20%, HER-2 negative ratio 1.22   12/01/2010 - 01/11/2011 Radiation Therapy Adjuvant radiation therapy   02/03/2011 -  Anti-estrogen oral therapy Femara 2.5 mg daily   02/22/2015 Imaging CT chest revealed 6 cm mediastinal mass, right hilar lymph nodes, base of neck lesions in both right and left, subtle liver metastases, periportal lymph nodes   02/28/2015 PET scan Bulky bilateral hypermetabolic supraclavicular and mediastinal lymphadenopathy consistent with eoplasm/metastatic disease. Three hepatic metastatic lesions   03/04/2015 Initial Biopsy Mediastinal mass Biopsy: Invasive Mammary cancer ER Neg    CHIEF COMPLIANT: Follow-up to discuss recent biopsy and PET CT results  INTERVAL HISTORY: Michele Hale is a 69 year old who speaks Arabic with above-mentioned history of left breast cancer diagnosed in 2011 and was currently on Femara when she presented with a large mediastinal mass which was hypermetabolic on PET/CT. She underwent a biopsy of 03/04/2015. The result came back as metastatic breast cancer. Her original tumor was estrogen receptor positive and the current tumor is estrogen receptor negative. She is here accompanied by her son and interpreter to discuss the results of pathology and treatment plan. She complains of left-sided neck and chest pain.  REVIEW OF SYSTEMS:   Constitutional: Denies fevers, chills or abnormal weight loss Eyes: Denies blurriness of vision Ears, nose, mouth, throat, and face: Denies mucositis or sore  throat Respiratory: Complains of chest and left neck pain Cardiovascular: Denies palpitation, chest discomfort or lower extremity swelling Gastrointestinal:  Denies nausea, heartburn or change in bowel habits Skin: Denies abnormal skin rashes Lymphatics: Denies new lymphadenopathy or easy bruising Neurological:Denies numbness, tingling or new weaknesses Behavioral/Psych: Mood is stable, no new changes  Breast:  denies any pain or lumps or nodules in either breasts All other systems were reviewed with the patient and are negative.  I have reviewed the past medical history, past surgical history, social history and family history with the patient and they are unchanged from previous note.  ALLERGIES:  has No Known Allergies.  MEDICATIONS:  Current Outpatient Prescriptions  Medication Sig Dispense Refill  . acetaminophen (TYLENOL) 500 MG tablet Take 1,000 mg by mouth every 6 (six) hours as needed. For arthritis pain    . calcium-vitamin D (OSCAL WITH D) 500-200 MG-UNIT per tablet Take 1 tablet by mouth 2 (two) times daily.    . cholecalciferol (VITAMIN D) 1000 UNITS tablet Take 1 tablet (1,000 Units total) by mouth daily. 30 tablet 12  . HYDROcodone-acetaminophen (NORCO/VICODIN) 5-325 MG per tablet Take 1-2 tablets by mouth every 4 (four) hours as needed. 10 tablet 0  . letrozole (FEMARA) 2.5 MG tablet Take 1 tablet (2.5 mg total) by mouth daily. 30 tablet 1  . loratadine (CLARITIN) 10 MG tablet Take 1 tablet (10 mg total) by mouth daily. (Patient not taking: Reported on 03/04/2015) 30 tablet 3  . PRESCRIPTION MEDICATION Supportive therapy [facility]    . promethazine (PHENERGAN) 25 MG tablet Take 1 tablet (25 mg total) by mouth every 6 (six) hours as needed. For nausea 180 tablet 0   No current facility-administered medications for this visit.    PHYSICAL  EXAMINATION: ECOG PERFORMANCE STATUS: 1 - Symptomatic but completely ambulatory  Filed Vitals:   03/12/15 1147  BP: 139/72  Pulse: 77   Temp: 97.5 F (36.4 C)  Resp: 18   Filed Weights   03/12/15 1147  Weight: 141 lb 11.2 oz (64.275 kg)    GENERAL:alert, no distress and comfortable SKIN: skin color, texture, turgor are normal, no rashes or significant lesions EYES: normal, Conjunctiva are pink and non-injected, sclera clear OROPHARYNX:no exudate, no erythema and lips, buccal mucosa, and tongue normal  NECK: supple, thyroid normal size, non-tender, without nodularity LYMPH:  no palpable lymphadenopathy in the cervical, axillary or inguinal LUNGS: clear to auscultation and percussion with normal breathing effort HEART: regular rate & rhythm and no murmurs and no lower extremity edema ABDOMEN:abdomen soft, non-tender and normal bowel sounds Musculoskeletal:no cyanosis of digits and no clubbing  NEURO: alert & oriented x 3 with fluent speech, no focal motor/sensory deficits   LABORATORY DATA:  I have reviewed the data as listed   Chemistry      Component Value Date/Time   NA 141 02/21/2015 1530   NA 143 05/10/2014 1509   K 4.0 02/21/2015 1530   K 4.1 05/10/2014 1509   CL 102 02/21/2015 1530   CL 104 01/23/2013 1420   CO2 24 02/21/2015 1530   CO2 29 05/10/2014 1509   BUN 13 02/21/2015 1530   BUN 9.0 05/10/2014 1509   CREATININE 0.54 02/21/2015 1530   CREATININE 0.8 05/10/2014 1509      Component Value Date/Time   CALCIUM 9.5 02/21/2015 1530   CALCIUM 9.5 05/10/2014 1509   ALKPHOS 51 05/10/2014 1509   ALKPHOS 63 02/07/2012 1312   AST 23 05/10/2014 1509   AST 17 02/07/2012 1312   ALT 15 05/10/2014 1509   ALT 15 02/07/2012 1312   BILITOT 0.40 05/10/2014 1509   BILITOT 0.3 02/07/2012 1312       Lab Results  Component Value Date   WBC 3.8* 03/04/2015   HGB 11.9* 03/04/2015   HCT 37.5 03/04/2015   MCV 81.3 03/04/2015   PLT 289 03/04/2015   NEUTROABS 2.3 03/04/2015     RADIOGRAPHIC STUDIES: I have personally reviewed the radiology reports and agreed with their findings. PET CT scan showed  markedly hypermetabolic lesions in the supraclavicular mediastinal areas as well as 3 lesions in the liver.  ASSESSMENT & PLAN:  Breast cancer, left breast Left breast cancer treated with neoadjuvant antiestrogen therapy followed by lumpectomy 11/10/2010 followed by radiation completed 01/11/2011, antiestrogen therapy with Femara 2.5 mg daily started 02/03/2011 PET/CT 03/04/15: Bulky metastatic disease in supraclavicular and Mediastinal LN and 3 hepatic lesions. Biopsy of the mediastinal LN: Metastatic Breast cancer ER negative, Breast prognostic panel Pending  Pathology and Radiology review: Discussed the results of PET scan and Biopsy results.  Recommendation: 1. Chemotherapy with Halaven days 1, 8 Q 3 weeks 2. Chemo counseling: Discussed the risks of Halaven including risk of infection, anemia, fatigue, hair loss, nausea and constipation or diarrhea.  Patient will need Port, chemo class and then start chemo    return to clinic 03/19/2015 to start chemotherapy I placed an interventional radiology consultation request for port placement I discussed with her the goal of chemotherapy as palliation and stage IV breast cancer cannot be cured. But we can certainly help with her symptoms and prolong her life.  No orders of the defined types were placed in this encounter.   The patient has a good understanding of the overall plan. she  agrees with it. she will call with any problems that may develop before the next visit here.   Rulon Eisenmenger, MD   Pathology on the

## 2015-03-12 NOTE — Telephone Encounter (Signed)
Pharmacist called reporting "we can't give this medication to her.  It"s too expensive, she has no insurance.  Could she get a green card and get this from a [place]?"  Will notify providers.

## 2015-03-12 NOTE — Telephone Encounter (Signed)
Per staff message and POF I have scheduled appts. Advised scheduler of appts. JMW  

## 2015-03-13 ENCOUNTER — Telehealth: Payer: Self-pay | Admitting: Hematology and Oncology

## 2015-03-13 ENCOUNTER — Ambulatory Visit: Payer: Self-pay | Admitting: Hematology and Oncology

## 2015-03-13 MED ORDER — LIDOCAINE-PRILOCAINE 2.5-2.5 % EX CREA: TOPICAL_CREAM | CUTANEOUS | Status: DC

## 2015-03-13 NOTE — Addendum Note (Signed)
Addended by: Cherylynn Ridges on: 03/13/2015 11:17 AM   Modules accepted: Orders

## 2015-03-13 NOTE — Telephone Encounter (Signed)
[facility] notified of this call.  Port-a-cath to be placed 03-17-2015.  First chemotherapy scheduled for 03-19-2015.  Also will send order to [facility] pending approval for medication assistance.

## 2015-03-13 NOTE — Telephone Encounter (Signed)
Left message to confirm chemo edu for 05/24

## 2015-03-13 NOTE — Telephone Encounter (Addendum)
Called son and informed him to bring bank account information and check stubs to determine if she qualifiers for assistance with medications.  Reports he will come in today.  Michele Hale does not work per [employer].

## 2015-03-13 NOTE — Telephone Encounter (Signed)
Michele Hale reports she "met with patients son today.  She does not qualify for assistance.  History of award of $1000.  Son able to obtain from [facility] for $11.50."

## 2015-03-14 ENCOUNTER — Other Ambulatory Visit: Payer: Self-pay | Admitting: Radiology

## 2015-03-17 ENCOUNTER — Telehealth: Payer: Self-pay

## 2015-03-17 ENCOUNTER — Ambulatory Visit (HOSPITAL_COMMUNITY)
Admission: RE | Admit: 2015-03-17 | Discharge: 2015-03-17 | Disposition: A | Discharge status: Home / Self Care | Payer: Self-pay | Source: Ambulatory Visit | Attending: Hematology and Oncology | Admitting: Hematology and Oncology

## 2015-03-17 ENCOUNTER — Other Ambulatory Visit: Payer: Self-pay | Admitting: Hematology and Oncology

## 2015-03-17 ENCOUNTER — Encounter (HOSPITAL_COMMUNITY): Payer: Self-pay

## 2015-03-17 DIAGNOSIS — C50912 Malignant neoplasm of unspecified site of left female breast: Secondary | ICD-10-CM

## 2015-03-17 DIAGNOSIS — C50919 Malignant neoplasm of unspecified site of unspecified female breast: Secondary | ICD-10-CM

## 2015-03-17 DIAGNOSIS — C771 Secondary and unspecified malignant neoplasm of intrathoracic lymph nodes: Secondary | ICD-10-CM | POA: Insufficient documentation

## 2015-03-17 LAB — CBC WITH DIFFERENTIAL/PLATELET
BASOS ABS: 0 10*3/uL (ref 0.0–0.1)
Basophils Relative: 1 % (ref 0–1)
EOS PCT: 3 % (ref 0–5)
Eosinophils Absolute: 0.1 10*3/uL (ref 0.0–0.7)
HCT: 37.8 % (ref 36.0–46.0)
Hemoglobin: 12.1 g/dL (ref 12.0–15.0)
LYMPHS PCT: 32 % (ref 12–46)
Lymphs Abs: 1.1 10*3/uL (ref 0.7–4.0)
MCH: 26.1 pg (ref 26.0–34.0)
MCHC: 32 g/dL (ref 30.0–36.0)
MCV: 81.5 fL (ref 78.0–100.0)
MONO ABS: 0.5 10*3/uL (ref 0.1–1.0)
MONOS PCT: 13 % — AB (ref 3–12)
Neutro Abs: 1.8 10*3/uL (ref 1.7–7.7)
Neutrophils Relative %: 51 % (ref 43–77)
PLATELETS: 284 10*3/uL (ref 150–400)
RBC: 4.64 MIL/uL (ref 3.87–5.11)
RDW: 12.9 % (ref 11.5–15.5)
WBC: 3.5 10*3/uL — AB (ref 4.0–10.5)

## 2015-03-17 MED ORDER — LIDOCAINE HCL 1 % IJ SOLN
INTRAMUSCULAR | Status: AC
  Filled 2015-03-17: qty 20

## 2015-03-17 MED ORDER — FENTANYL CITRATE (PF) 100 MCG/2ML IJ SOLN
INTRAMUSCULAR | Status: AC | PRN
  Administered 2015-03-17: 50 ug via INTRAVENOUS
  Administered 2015-03-17: 25 ug via INTRAVENOUS

## 2015-03-17 MED ORDER — SODIUM CHLORIDE 0.9 % IV SOLN
INTRAVENOUS | Status: DC
  Administered 2015-03-17: 09:00:00 via INTRAVENOUS

## 2015-03-17 MED ORDER — HEPARIN SOD (PORK) LOCK FLUSH 100 UNIT/ML IV SOLN
INTRAVENOUS | Status: AC
  Filled 2015-03-17: qty 5

## 2015-03-17 MED ORDER — CEFAZOLIN SODIUM-DEXTROSE 2-3 GM-% IV SOLR
2.0000 g | INTRAVENOUS | Status: AC
  Administered 2015-03-17: 2 g via INTRAVENOUS

## 2015-03-17 MED ORDER — HEPARIN SOD (PORK) LOCK FLUSH 100 UNIT/ML IV SOLN
INTRAVENOUS | Status: AC | PRN
  Administered 2015-03-17: 500 [IU]

## 2015-03-17 MED ORDER — FENTANYL CITRATE (PF) 100 MCG/2ML IJ SOLN
INTRAMUSCULAR | Status: AC
  Filled 2015-03-17: qty 4

## 2015-03-17 MED ORDER — MIDAZOLAM HCL 2 MG/2ML IJ SOLN
INTRAMUSCULAR | Status: AC
  Filled 2015-03-17: qty 4

## 2015-03-17 MED ORDER — LIDOCAINE-EPINEPHRINE 2 %-1:100000 IJ SOLN
INTRAMUSCULAR | Status: AC
  Filled 2015-03-17: qty 1

## 2015-03-17 MED ORDER — MIDAZOLAM HCL 2 MG/2ML IJ SOLN
INTRAMUSCULAR | Status: AC | PRN
  Administered 2015-03-17 (×2): 1 mg via INTRAVENOUS

## 2015-03-17 MED ORDER — PROMETHAZINE HCL 25 MG PO TABS: 25.0000 mg | ORAL_TABLET | Freq: Four times a day (QID) | ORAL | Status: DC | PRN

## 2015-03-17 MED ORDER — CEFAZOLIN SODIUM-DEXTROSE 2-3 GM-% IV SOLR
INTRAVENOUS | Status: AC
  Filled 2015-03-17: qty 50

## 2015-03-17 NOTE — Telephone Encounter (Signed)
Call report rcvd from [facility].  Sent to scan.  phenergan refilled

## 2015-03-17 NOTE — Discharge Instructions (Signed)

## 2015-03-17 NOTE — Progress Notes (Signed)
Patient ID: Michele Hale, female   DOB: 03-20-1946, 69 y.o.   MRN: [id number]    Referring Physician(s): Gudena,Vinay  Subjective: Patient familiar to IR service from recent mediastinal mass biopsy. She has known metastatic left breast cancer and presents today for Port-A-Cath placement for chemotherapy.She continues to have some intermittent left chest discomfort as well as occasional nausea.   Allergies: Review of patient's allergies indicates no known allergies.  Medications: Prior to Admission medications   Medication Sig Start Date End Date Taking? Authorizing Provider  acetaminophen (TYLENOL) 500 MG tablet Take 1,000 mg by mouth every 6 (six) hours as needed. For arthritis pain    Historical Provider, MD  calcium-vitamin D (OSCAL WITH D) 500-200 MG-UNIT per tablet Take 1 tablet by mouth 2 (two) times daily.    Historical Provider, MD  cholecalciferol (VITAMIN D) 1000 UNITS tablet Take 1 tablet (1,000 Units total) by mouth daily. 01/23/13   Consuela Mimes, MD  HYDROcodone-acetaminophen (NORCO/VICODIN) 5-325 MG per tablet Take 1-2 tablets by mouth every 4 (four) hours as needed. 02/21/15   Wandra Arthurs, MD  letrozole [facility]) 2.5 MG tablet Take 1 tablet (2.5 mg total) by mouth daily. 08/21/14   Minette Headland, NP  lidocaine-prilocaine (EMLA) cream Apply to port-a-cath one and a half hours before use.  Cover cream with plastic wrap to seal. 03/13/15   Nicholas Lose, MD  loratadine (CLARITIN) 10 MG tablet Take 1 tablet (10 mg total) by mouth daily. Patient not taking: Reported on 03/04/2015 12/25/13   Lorayne Marek, MD  ondansetron (ZOFRAN) 8 MG tablet Take 1 tablet (8 mg total) by mouth 2 (two) times daily. Start the day after chemo for 2 days. Then take as needed for nausea or vomiting. 03/12/15   Nicholas Lose, MD  PRESCRIPTION MEDICATION Supportive therapy [place]    Historical Provider, MD  prochlorperazine (COMPAZINE) 10 MG tablet Take 1 tablet (10 mg total) by mouth every 6 (six) hours as  needed (Nausea or vomiting). 03/12/15   Nicholas Lose, MD  promethazine (PHENERGAN) 25 MG tablet Take 1 tablet (25 mg total) by mouth every 6 (six) hours as needed. For nausea 08/21/14   Minette Headland, NP     Vital Signs: BP 155/86 mmHg  Pulse 87  Temp(Src) 97.7 F (36.5 C) (Oral)  Resp 12  SpO2 98%  Physical ExamPatient awake, alert. Chest clear to auscultation bilaterally. Bilateral supraclavicular lymphadenopathy. Heart with regular rate and rhythm. Abdomen soft, positive bowel sounds, mild diffuse tenderness to palpation. Extremities full range of motion and no significant edema.  Imaging: No results found.  Labs:  CBC:  Recent Labs  05/10/14 1509 02/21/15 1530 03/04/15 0936  WBC 3.6* 5.2 3.8*  HGB 11.7 12.2 11.9*  HCT 36.1 38.6 37.5  PLT 269 341 289    COAGS:  Recent Labs  03/04/15 0936  INR 1.07  APTT 32    BMP:  Recent Labs  05/10/14 1509 02/21/15 1530  NA 143 141  K 4.1 4.0  CL  --  102  CO2 29 24  GLUCOSE 113 97  BUN 9.0 13  CALCIUM 9.5 9.5  CREATININE 0.8 0.54  GFRNONAA  --  >90  GFRAA  --  >90    LIVER FUNCTION TESTS:  Recent Labs  05/10/14 1509  BILITOT 0.40  AST 23  ALT 15  ALKPHOS 51  PROT 7.3  ALBUMIN 3.8    Assessment and Plan:  Patient with history of metastatic breast carcinoma. She presents today for Port-A-Cath  placement for chemotherapy. Risks and benefits discussed with the patient/son including, but not limited to bleeding, infection, pneumothorax, or fibrin sheath development and need for additional procedures. All of the patient's questions were answered, patient is agreeable to proceed. Consent signed and in chart.    Signed: D. Rowe Robert 03/17/2015, 8:31 AM   I spent a total of 15 minutes in face to face in clinical consultation/evaluation, greater than 50% of which was counseling/coordinating care for port a cath placement.

## 2015-03-17 NOTE — Procedures (Signed)
R IJ Port cathter placement with US and fluoroscopy No complication No blood loss. See complete dictation in [facility] PACS.  

## 2015-03-18 ENCOUNTER — Encounter: Payer: Self-pay | Admitting: Hematology and Oncology

## 2015-03-18 ENCOUNTER — Other Ambulatory Visit: Payer: Self-pay

## 2015-03-18 NOTE — Progress Notes (Signed)
I left message for Dorrie at [facility] to see if asst for zofran since uninsured.

## 2015-03-19 ENCOUNTER — Other Ambulatory Visit (HOSPITAL_BASED_OUTPATIENT_CLINIC_OR_DEPARTMENT_OTHER): Payer: Self-pay

## 2015-03-19 ENCOUNTER — Telehealth: Payer: Self-pay | Admitting: Hematology and Oncology

## 2015-03-19 ENCOUNTER — Encounter: Payer: Self-pay | Admitting: Hematology and Oncology

## 2015-03-19 ENCOUNTER — Ambulatory Visit (HOSPITAL_BASED_OUTPATIENT_CLINIC_OR_DEPARTMENT_OTHER): Payer: Self-pay

## 2015-03-19 ENCOUNTER — Ambulatory Visit (HOSPITAL_BASED_OUTPATIENT_CLINIC_OR_DEPARTMENT_OTHER): Payer: Self-pay | Admitting: Hematology and Oncology

## 2015-03-19 VITALS — BP 129/86 | HR 96 | Temp 98.6°F | Resp 18 | Ht 65.0 in | Wt 142.2 lb

## 2015-03-19 DIAGNOSIS — C50512 Malignant neoplasm of lower-outer quadrant of left female breast: Secondary | ICD-10-CM

## 2015-03-19 DIAGNOSIS — C778 Secondary and unspecified malignant neoplasm of lymph nodes of multiple regions: Secondary | ICD-10-CM

## 2015-03-19 DIAGNOSIS — C50912 Malignant neoplasm of unspecified site of left female breast: Secondary | ICD-10-CM

## 2015-03-19 DIAGNOSIS — C787 Secondary malignant neoplasm of liver and intrahepatic bile duct: Secondary | ICD-10-CM

## 2015-03-19 DIAGNOSIS — Z5111 Encounter for antineoplastic chemotherapy: Secondary | ICD-10-CM

## 2015-03-19 DIAGNOSIS — C50919 Malignant neoplasm of unspecified site of unspecified female breast: Secondary | ICD-10-CM

## 2015-03-19 LAB — CBC WITH DIFFERENTIAL/PLATELET
BASO%: 0.9 % (ref 0.0–2.0)
BASOS ABS: 0 10*3/uL (ref 0.0–0.1)
EOS ABS: 0.1 10*3/uL (ref 0.0–0.5)
EOS%: 2.8 % (ref 0.0–7.0)
HCT: 38.1 % (ref 34.8–46.6)
HGB: 12.7 g/dL (ref 11.6–15.9)
LYMPH#: 1.1 10*3/uL (ref 0.9–3.3)
LYMPH%: 26.6 % (ref 14.0–49.7)
MCH: 26.7 pg (ref 25.1–34.0)
MCHC: 33.3 g/dL (ref 31.5–36.0)
MCV: 80.1 fL (ref 79.5–101.0)
MONO#: 0.4 10*3/uL (ref 0.1–0.9)
MONO%: 9.4 % (ref 0.0–14.0)
NEUT%: 60.3 % (ref 38.4–76.8)
NEUTROS ABS: 2.4 10*3/uL (ref 1.5–6.5)
PLATELETS: 276 10*3/uL (ref 145–400)
RBC: 4.76 10*6/uL (ref 3.70–5.45)
RDW: 13 % (ref 11.2–14.5)
WBC: 4 10*3/uL (ref 3.9–10.3)

## 2015-03-19 LAB — COMPREHENSIVE METABOLIC PANEL (CC13)
ALT: 12 U/L (ref 0–55)
AST: 18 U/L (ref 5–34)
Albumin: 3.4 g/dL — ABNORMAL LOW (ref 3.5–5.0)
Alkaline Phosphatase: 86 U/L (ref 40–150)
Anion Gap: 13 mEq/L — ABNORMAL HIGH (ref 3–11)
BUN: 8.2 mg/dL (ref 7.0–26.0)
CALCIUM: 9 mg/dL (ref 8.4–10.4)
CO2: 23 mEq/L (ref 22–29)
Chloride: 102 mEq/L (ref 98–109)
Creatinine: 0.8 mg/dL (ref 0.6–1.1)
EGFR: 88 mL/min/{1.73_m2} — ABNORMAL LOW (ref >90)
Glucose: 189 mg/dl — ABNORMAL HIGH (ref 70–140)
Potassium: 4.1 mEq/L (ref 3.5–5.1)
Sodium: 138 mEq/L (ref 136–145)
Total Bilirubin: 0.33 mg/dL (ref 0.20–1.20)
Total Protein: 7.5 g/dL (ref 6.4–8.3)

## 2015-03-19 MED ORDER — SODIUM CHLORIDE 0.9 % IJ SOLN
10.0000 mL | INTRAMUSCULAR | Status: DC | PRN
  Administered 2015-03-19: 10 mL
  Filled 2015-03-19: qty 10

## 2015-03-19 MED ORDER — PALONOSETRON HCL INJECTION 0.25 MG/5ML
INTRAVENOUS | Status: AC
  Filled 2015-03-19: qty 5

## 2015-03-19 MED ORDER — SODIUM CHLORIDE 0.9 % IV SOLN
Freq: Once | INTRAVENOUS | Status: AC
  Administered 2015-03-19: 10:00:00 via INTRAVENOUS

## 2015-03-19 MED ORDER — SODIUM CHLORIDE 0.9 % IV SOLN
1.4000 mg/m2 | Freq: Once | INTRAVENOUS | Status: AC
  Administered 2015-03-19: 2.4 mg via INTRAVENOUS
  Filled 2015-03-19: qty 4.8

## 2015-03-19 MED ORDER — SODIUM CHLORIDE 0.9 % IV SOLN
Freq: Once | INTRAVENOUS | Status: AC
  Administered 2015-03-19: 10:00:00 via INTRAVENOUS
  Filled 2015-03-19: qty 1

## 2015-03-19 MED ORDER — PALONOSETRON HCL INJECTION 0.25 MG/5ML
0.2500 mg | Freq: Once | INTRAVENOUS | Status: AC
  Administered 2015-03-19: 0.25 mg via INTRAVENOUS

## 2015-03-19 MED ORDER — HEPARIN SOD (PORK) LOCK FLUSH 100 UNIT/ML IV SOLN
500.0000 [IU] | Freq: Once | INTRAVENOUS | Status: AC | PRN
  Administered 2015-03-19: 500 [IU]
  Filled 2015-03-19: qty 5

## 2015-03-19 NOTE — Progress Notes (Signed)
Per Dorrie with [facility], there is no asst available for Zofran for this patient. PAN she has to have insurance.

## 2015-03-19 NOTE — Patient Instructions (Signed)
[facility] Discharge Instructions for Patients Receiving Chemotherapy  Today you received the following chemotherapy agents:  eribulin (halaven)  To help prevent nausea and vomiting after your treatment, we encourage you to take your nausea medication.  Take it as often as prescribed.     If you develop nausea and vomiting that is not controlled by your nausea medication, call the clinic. If it is after clinic hours your family physician or the after hours number for the clinic or go to the Emergency Department.   BELOW ARE SYMPTOMS THAT SHOULD BE REPORTED IMMEDIATELY:  *FEVER GREATER THAN 100.5 F  *CHILLS WITH OR WITHOUT FEVER  NAUSEA AND VOMITING THAT IS NOT CONTROLLED WITH YOUR NAUSEA MEDICATION  *UNUSUAL SHORTNESS OF BREATH  *UNUSUAL BRUISING OR BLEEDING  TENDERNESS IN MOUTH AND THROAT WITH OR WITHOUT PRESENCE OF ULCERS  *URINARY PROBLEMS  *BOWEL PROBLEMS  UNUSUAL RASH Items with * indicate a potential emergency and should be followed up as soon as possible.  One of the nurses will contact you 24 hours after your treatment. Please let the nurse know about any problems that you may have experienced. Feel free to call the clinic you have any questions or concerns. The clinic phone number is [phone].   I have been informed and understand all the instructions given to me. I know to contact the clinic, my physician, or go to the Emergency Department if any problems should occur. I do not have any questions at this time, but understand that I may call the clinic during office hours   should I have any questions or need assistance in obtaining follow up care.    __________________________________________  _____________  __________ Signature of Patient or Authorized Representative            Date                   Time    __________________________________________ Nurse's Signature    Eribulin solution for injection (halaven) What is this  medicine? ERIBULIN is a chemotherapy drug. It is used to treat breast cancer. This medicine may be used for other purposes; ask your health care provider or pharmacist if you have questions. COMMON BRAND NAME(S): Halaven What should I tell my health care provider before I take this medicine? They need to know if you have any of these conditions: -heart disease -kidney disease -liver disease -low blood counts, like low white cell, platelet, or red cell counts -an unusual or allergic reaction to eribulin, other medicines, foods, dyes, or preservatives -pregnant or trying to get pregnant -breast-feeding How should I use this medicine? This medicine is for infusion into a vein. It is given by a health care professional in a hospital or clinic setting. Talk to your pediatrician regarding the use of this medicine in children. Special care may be needed. Overdosage: If you think you've taken too much of this medicine contact a poison control center or emergency room at once. Overdosage: If you think you have taken too much of this medicine contact a poison control center or emergency room at once. NOTE: This medicine is only for you. Do not share this medicine with others. What if I miss a dose? It is important not to miss your dose. Call your doctor or health care professional if you are unable to keep an appointment. What may interact with this medicine? Do not take this medicine with any of the following medications: -amiodarone -astemizole -arsenic trioxide -bepridil -bretylium -chloroquine -  chlorpromazine -cisapride -clarithromycin -dextromethorphan,  quinidine -disopyramide -dofetilide -droperidol -dronedarone -erythromycin -grepafloxacin -halofantrine -haloperidol -ibutilide -levomethadyl -mesoridazine -methadone -pentamidine -procainamide -quinidine -pimozide -posaconazole -probucol -propafenone -saquinavir -sotalol -sparfloxacin -terfenadine -thioridazine -troleandomycin -ziprasidone This list may not describe all possible interactions. Give your health care provider a list of all the medicines, herbs, non-prescription drugs, or dietary supplements you use. Also tell them if you smoke, drink alcohol, or use illegal drugs. Some items may interact with your medicine. What should I watch for while using this medicine? Your condition will be monitored carefully while you are receiving this medicine. This drug may make you feel generally unwell. This is not uncommon, as chemotherapy can affect healthy cells as well as cancer cells. Report any side effects. Continue your course of treatment even though you feel ill unless your doctor tells you to stop. Call your doctor or health care professional for advice if you get a fever, chills or sore throat, or other symptoms of a cold or flu. Do not treat yourself. This drug decreases your body's ability to fight infections. Try to avoid being around people who are sick. This medicine may increase your risk to bruise or bleed. Call your doctor or health care professional if you notice any unusual bleeding. Be careful brushing and flossing your teeth or using a toothpick because you may get an infection or bleed more easily. If you have any dental work done, tell your dentist you are receiving this medicine. Avoid taking products that contain aspirin, acetaminophen, ibuprofen, naproxen, or ketoprofen unless instructed by your doctor. These medicines may hide a fever. Do not become pregnant while taking this medicine. Women should inform their doctor if they wish to become pregnant or think  they might be pregnant. There is a potential for serious side effects to an unborn child. Talk to your health care professional or pharmacist for more information. Do not breast-feed an infant while taking this medicine. What side effects may I notice from receiving this medicine? Side effects that you should report to your doctor or health care professional as soon as possible: -allergic reactions like skin rash, itching or hives, swelling of the face, lips, or tongue -low blood counts - this medicine may decrease the number of white blood cells, red blood cells and platelets. You may be at increased risk for infections and bleeding. -signs of infection - fever or chills, cough, sore throat, pain or difficulty passing urine -signs of decreased platelets or bleeding - bruising, pinpoint red spots on the skin, black, tarry stools, blood in the urine -signs of decreased red blood cells - unusually weak or tired, fainting spells, lightheadedness -pain, tingling, numbness in the hands or feet Side effects that usually do not require medical attention (Report these to your doctor or health care professional if they continue or are bothersome.): -constipation -hair loss -headache -loss of appetite -muscle or joint pain -nausea, vomiting -stomach pain This list may not describe all possible side effects. Call your doctor for medical advice about side effects. You may report side effects to FDA at 1-800-FDA-1088. Where should I keep my medicine? This drug is given in a hospital or clinic and will not be stored at home. NOTE: This sheet is a summary. It may not cover all possible information. If you have questions about this medicine, talk to your doctor, pharmacist, or health care provider.  2015, [employer]vier/Gold Standard. (2009-10-02 23:04:37)

## 2015-03-19 NOTE — Assessment & Plan Note (Signed)
Left breast cancer treated with neoadjuvant antiestrogen therapy followed by lumpectomy 11/10/2010 followed by radiation completed 01/11/2011, antiestrogen therapy with Femara 2.5 mg daily started 02/03/2011 PET/CT 03/04/15: Bulky metastatic disease in supraclavicular and Mediastinal LN and 3 hepatic lesions. Biopsy of the mediastinal LN: Metastatic Breast cancer ER negative, Breast prognostic panel Pending  Treatment plan: Goal: Palliation; Chemotherapy with Halaven days 1, 8 Q 3 weeks Current treatment: Cycle 1 day 1 Halaven  Port has been placed Monitoring closely for chemotherapy toxicities Blood counts are reviewed and they're adequate for treatment  Return to clinic in 1 week for follow-up and toxicity check

## 2015-03-19 NOTE — Telephone Encounter (Signed)
appointments made and avs printed for patient °

## 2015-03-19 NOTE — Progress Notes (Signed)
Patient Care Team: Tresa Garter, MD as PCP - General (Internal Medicine)  DIAGNOSIS: No matching staging information was found for the patient.  SUMMARY OF ONCOLOGIC HISTORY:   Breast cancer, left breast   04/06/2010 - 10/26/2010 Anti-estrogen oral therapy Neoadjuvant antiestrogen therapy   11/10/2010 Surgery Left breast lumpectomy: Invasive ductal carcinoma 2 cm, grade 3, angiolymphatic invasion present, 6 lymph nodes negative, ER 99%, PR 90%, Ki-67 20%, HER-2 negative ratio 1.22   12/01/2010 - 01/11/2011 Radiation Therapy Adjuvant radiation therapy   02/03/2011 -  Anti-estrogen oral therapy Femara 2.5 mg daily   02/22/2015 Imaging CT chest revealed 6 cm mediastinal mass, right hilar lymph nodes, base of neck lesions in both right and left, subtle liver metastases, periportal lymph nodes   02/28/2015 PET scan Bulky bilateral hypermetabolic supraclavicular and mediastinal lymphadenopathy consistent with eoplasm/metastatic disease. Three hepatic metastatic lesions   03/04/2015 Initial Biopsy Mediastinal mass Biopsy: Invasive Mammary cancer ER Neg   03/19/2015 -  Chemotherapy Halaven day 1 and day 8 every 3 weeks    CHIEF COMPLIANT: Cycle 1 day 1 [place]aven  INTERVAL HISTORY: Michele Hale is a 69 year old with the above-mentioned history of left breast cancer with metastatic disease involving the mediastinum who is currently starting palliative chemotherapy with Halaven. She underwent port placement and is ready to start treatment today. She fill her prescriptions and I conducted chemotherapy education for her.  REVIEW OF SYSTEMS:   Constitutional: Denies fevers, chills or abnormal weight loss Eyes: Denies blurriness of vision Ears, nose, mouth, throat, and face: Denies mucositis or sore throat Respiratory: Denies cough, dyspnea or wheezes Cardiovascular: Denies palpitation, chest discomfort or lower extremity swelling Gastrointestinal:  Denies nausea, heartburn or change in bowel habits Skin:  Denies abnormal skin rashes Lymphatics: Denies new lymphadenopathy or easy bruising Neurological:Denies numbness, tingling or new weaknesses Behavioral/Psych: Mood is stable, no new changes  Breast:  denies any pain or lumps or nodules in either breasts All other systems were reviewed with the patient and are negative.  I have reviewed the past medical history, past surgical history, social history and family history with the patient and they are unchanged from previous note.  ALLERGIES:  has No Known Allergies.  MEDICATIONS:  Current Outpatient Prescriptions  Medication Sig Dispense Refill  . acetaminophen (TYLENOL) 500 MG tablet Take 1,000 mg by mouth every 6 (six) hours as needed. For arthritis pain    . calcium-vitamin D (OSCAL WITH D) 500-200 MG-UNIT per tablet Take 1 tablet by mouth 2 (two) times daily.    . cholecalciferol (VITAMIN D) 1000 UNITS tablet Take 1 tablet (1,000 Units total) by mouth daily. 30 tablet 12  . HYDROcodone-acetaminophen (NORCO/VICODIN) 5-325 MG per tablet Take 1-2 tablets by mouth every 4 (four) hours as needed. (Patient taking differently: Take 1-2 tablets by mouth every 4 (four) hours as needed for moderate pain. ) 10 tablet 0  . letrozole (FEMARA) 2.5 MG tablet Take 1 tablet (2.5 mg total) by mouth daily. 30 tablet 1  . lidocaine-prilocaine (EMLA) cream Apply to port-a-cath one and a half hours before use.  Cover cream with plastic wrap to seal. 30 g 3  . loratadine (CLARITIN) 10 MG tablet Take 1 tablet (10 mg total) by mouth daily. 30 tablet 3  . ondansetron (ZOFRAN) 8 MG tablet Take 1 tablet (8 mg total) by mouth 2 (two) times daily. Start the day after chemo for 2 days. Then take as needed for nausea or vomiting. 30 tablet 1  . PRESCRIPTION MEDICATION Supportive  therapy [facility]    . prochlorperazine (COMPAZINE) 10 MG tablet Take 1 tablet (10 mg total) by mouth every 6 (six) hours as needed (Nausea or vomiting). 30 tablet 1  . promethazine (PHENERGAN) 25 MG  tablet Take 1 tablet (25 mg total) by mouth every 6 (six) hours as needed. For nausea 60 tablet 0   No current facility-administered medications for this visit.    PHYSICAL EXAMINATION: ECOG PERFORMANCE STATUS: 1 - Symptomatic but completely ambulatory  Filed Vitals:   03/19/15 0921  BP: 129/86  Pulse: 96  Temp: 98.6 F (37 C)  Resp: 18   Filed Weights   03/19/15 0921  Weight: 142 lb 4 oz (64.524 kg)    GENERAL:alert, no distress and comfortable SKIN: skin color, texture, turgor are normal, no rashes or significant lesions EYES: normal, Conjunctiva are pink and non-injected, sclera clear OROPHARYNX:no exudate, no erythema and lips, buccal mucosa, and tongue normal  NECK: supple, thyroid normal size, non-tender, without nodularity LYMPH:  no palpable lymphadenopathy in the cervical, axillary or inguinal LUNGS: clear to auscultation and percussion with normal breathing effort HEART: regular rate & rhythm and no murmurs and no lower extremity edema ABDOMEN:abdomen soft, non-tender and normal bowel sounds Musculoskeletal:no cyanosis of digits and no clubbing  NEURO: alert & oriented x 3 with fluent speech, no focal motor/sensory deficits  LABORATORY DATA:  I have reviewed the data as listed   Chemistry      Component Value Date/Time   NA 141 02/21/2015 1530   NA 143 05/10/2014 1509   K 4.0 02/21/2015 1530   K 4.1 05/10/2014 1509   CL 102 02/21/2015 1530   CL 104 01/23/2013 1420   CO2 24 02/21/2015 1530   CO2 29 05/10/2014 1509   BUN 13 02/21/2015 1530   BUN 9.0 05/10/2014 1509   CREATININE 0.54 02/21/2015 1530   CREATININE 0.8 05/10/2014 1509      Component Value Date/Time   CALCIUM 9.5 02/21/2015 1530   CALCIUM 9.5 05/10/2014 1509   ALKPHOS 51 05/10/2014 1509   ALKPHOS 63 02/07/2012 1312   AST 23 05/10/2014 1509   AST 17 02/07/2012 1312   ALT 15 05/10/2014 1509   ALT 15 02/07/2012 1312   BILITOT 0.40 05/10/2014 1509   BILITOT 0.3 02/07/2012 1312        Lab Results  Component Value Date   WBC 4.0 03/19/2015   HGB 12.7 03/19/2015   HCT 38.1 03/19/2015   MCV 80.1 03/19/2015   PLT 276 03/19/2015   NEUTROABS 2.4 03/19/2015    ASSESSMENT & PLAN:  Breast cancer, left breast Left breast cancer treated with neoadjuvant antiestrogen therapy followed by lumpectomy 11/10/2010 followed by radiation completed 01/11/2011, antiestrogen therapy with Femara 2.5 mg daily started 02/03/2011 PET/CT 03/04/15: Bulky metastatic disease in supraclavicular and Mediastinal LN and 3 hepatic lesions. Biopsy of the mediastinal LN: Metastatic Breast cancer ER negative, Breast prognostic panel Pending  Treatment plan: Goal: Palliation; Chemotherapy with Halaven days 1, 8 Q 3 weeks Current treatment: Cycle 1 day 1 [place] has been placed Monitoring closely for chemotherapy toxicities Blood counts are reviewed and they're adequate for treatment Patient will be participating in Ramadan however she is willing to go through chemotherapy through this. She will however be fasting for that month from morning to evening. I encouraged her to drink lots of water before sunrise and after sunset to keep up on the hydration. If necessary we may have to give her IV fluids.   Return  to clinic in 1 week for follow-up and toxicity check  No orders of the defined types were placed in this encounter.   The patient has a good understanding of the overall plan. she agrees with it. she will call with any problems that may develop before the next visit here.   Rulon Eisenmenger, MD

## 2015-03-25 NOTE — Assessment & Plan Note (Signed)
Left breast cancer treated with neoadjuvant antiestrogen therapy followed by lumpectomy 11/10/2010 followed by radiation completed 01/11/2011, antiestrogen therapy with Femara 2.5 mg daily started 02/03/2011  PET/CT 03/04/15: Bulky metastatic disease in supraclavicular and Mediastinal LN and 3 hepatic lesions.  Biopsy of the mediastinal LN: Metastatic Breast cancer ER negative, Breast prognostic panel Pending  Treatment plan: Goal: Palliation; Chemotherapy with Halaven days 1, 8 Q 3 weeks  Current treatment: Cycle 2 day 1 [place]   Monitoring closely for chemotherapy toxicities.   Blood counts are reviewed and they're adequate for treatment  Patient will be participating in Ramadan however she is willing to go through chemotherapy through this. She will however be fasting for that month from morning to evening. I encouraged her to drink lots of water before sunrise and after sunset to keep up on the hydration. If necessary we may have to give her IV fluids.  Return to clinic in 3 weeks for follow up with cycle 3.

## 2015-03-26 ENCOUNTER — Other Ambulatory Visit (HOSPITAL_BASED_OUTPATIENT_CLINIC_OR_DEPARTMENT_OTHER): Payer: Self-pay

## 2015-03-26 ENCOUNTER — Ambulatory Visit: Payer: Self-pay

## 2015-03-26 ENCOUNTER — Telehealth: Payer: Self-pay | Admitting: Hematology and Oncology

## 2015-03-26 ENCOUNTER — Ambulatory Visit (HOSPITAL_BASED_OUTPATIENT_CLINIC_OR_DEPARTMENT_OTHER): Payer: Self-pay | Admitting: Hematology and Oncology

## 2015-03-26 VITALS — BP 136/82 | HR 94 | Temp 98.2°F | Resp 17 | Ht 65.0 in | Wt 145.3 lb

## 2015-03-26 DIAGNOSIS — C778 Secondary and unspecified malignant neoplasm of lymph nodes of multiple regions: Secondary | ICD-10-CM

## 2015-03-26 DIAGNOSIS — C50912 Malignant neoplasm of unspecified site of left female breast: Secondary | ICD-10-CM

## 2015-03-26 DIAGNOSIS — R630 Anorexia: Secondary | ICD-10-CM

## 2015-03-26 DIAGNOSIS — C787 Secondary malignant neoplasm of liver and intrahepatic bile duct: Secondary | ICD-10-CM

## 2015-03-26 DIAGNOSIS — M542 Cervicalgia: Secondary | ICD-10-CM

## 2015-03-26 LAB — CBC WITH DIFFERENTIAL/PLATELET
BASO%: 1.1 % (ref 0.0–2.0)
BASOS ABS: 0 10*3/uL (ref 0.0–0.1)
EOS ABS: 0 10*3/uL (ref 0.0–0.5)
EOS%: 0.4 % (ref 0.0–7.0)
HCT: 33.6 % — ABNORMAL LOW (ref 34.8–46.6)
HGB: 11.2 g/dL — ABNORMAL LOW (ref 11.6–15.9)
LYMPH%: 48.4 % (ref 14.0–49.7)
MCH: 26.7 pg (ref 25.1–34.0)
MCHC: 33.3 g/dL (ref 31.5–36.0)
MCV: 80.2 fL (ref 79.5–101.0)
MONO#: 0.2 10*3/uL (ref 0.1–0.9)
MONO%: 8.8 % (ref 0.0–14.0)
NEUT%: 41.3 % (ref 38.4–76.8)
NEUTROS ABS: 1.1 10*3/uL — AB (ref 1.5–6.5)
PLATELETS: 245 10*3/uL (ref 145–400)
RBC: 4.19 10*6/uL (ref 3.70–5.45)
RDW: 12.9 % (ref 11.2–14.5)
WBC: 2.7 10*3/uL — AB (ref 3.9–10.3)
lymph#: 1.3 10*3/uL (ref 0.9–3.3)

## 2015-03-26 LAB — COMPREHENSIVE METABOLIC PANEL (CC13)
ALBUMIN: 3.3 g/dL — AB (ref 3.5–5.0)
ALK PHOS: 74 U/L (ref 40–150)
ALT: 14 U/L (ref 0–55)
ANION GAP: 9 meq/L (ref 3–11)
AST: 18 U/L (ref 5–34)
BILIRUBIN TOTAL: 0.25 mg/dL (ref 0.20–1.20)
BUN: 4.4 mg/dL — ABNORMAL LOW (ref 7.0–26.0)
CO2: 29 mEq/L (ref 22–29)
CREATININE: 0.6 mg/dL (ref 0.6–1.1)
Calcium: 9.4 mg/dL (ref 8.4–10.4)
Chloride: 101 mEq/L (ref 98–109)
EGFR: >90 mL/min/{1.73_m2} (ref >90)
Glucose: 102 mg/dl (ref 70–140)
Potassium: 3.8 mEq/L (ref 3.5–5.1)
SODIUM: 139 meq/L (ref 136–145)
TOTAL PROTEIN: 7.1 g/dL (ref 6.4–8.3)

## 2015-03-26 MED ORDER — DEXAMETHASONE 2 MG PO TABS: 2.0000 mg | ORAL_TABLET | Freq: Every day | ORAL | Status: DC

## 2015-03-26 MED ORDER — IBUPROFEN 800 MG PO TABS: 800.0000 mg | ORAL_TABLET | Freq: Three times a day (TID) | ORAL | Status: AC | PRN

## 2015-03-26 NOTE — Telephone Encounter (Signed)
appointments made and avs printed for patient,patient could not get a chemo today   anne

## 2015-03-26 NOTE — Progress Notes (Signed)
Patient Care Team: Tresa Garter, MD as PCP - General (Internal Medicine)  DIAGNOSIS: No matching staging information was found for the patient.  SUMMARY OF ONCOLOGIC HISTORY:   Breast cancer, left breast   04/06/2010 - 10/26/2010 Anti-estrogen oral therapy Neoadjuvant antiestrogen therapy   11/10/2010 Surgery Left breast lumpectomy: Invasive ductal carcinoma 2 cm, grade 3, angiolymphatic invasion present, 6 lymph nodes negative, ER 99%, PR 90%, Ki-67 20%, HER-2 negative ratio 1.22   12/01/2010 - 01/11/2011 Radiation Therapy Adjuvant radiation therapy   02/03/2011 -  Anti-estrogen oral therapy Femara 2.5 mg daily   02/22/2015 Imaging CT chest revealed 6 cm mediastinal mass, right hilar lymph nodes, base of neck lesions in both right and left, subtle liver metastases, periportal lymph nodes   02/28/2015 PET scan Bulky bilateral hypermetabolic supraclavicular and mediastinal lymphadenopathy consistent with eoplasm/metastatic disease. Three hepatic metastatic lesions   03/04/2015 Initial Biopsy Mediastinal mass Biopsy: Invasive Mammary cancer ER Neg   03/19/2015 -  Chemotherapy Halaven day 1 and day 8 every 3 weeks    CHIEF COMPLIANT: Cycle 1 day 8 [place] postponed for low blood counts  INTERVAL HISTORY: Michele Hale is a 69 year old with above-mentioned history of metastatic breast cancer with a large mediastinal mass. She is currently on palliative chemotherapy. She is today here to receive cycle 1 day 8 [place]. Her blood counts revealed a neutrophil count of 1100. So she cannot get treated today. She denies any nausea vomiting. She does have decreased appetite. Her taste is not good.  REVIEW OF SYSTEMS:   Constitutional: Denies fevers, chills or abnormal weight loss Eyes: Denies blurriness of vision, complains of bilateral neck pains related to the port on the right side Ears, nose, mouth, throat, and face: Denies mucositis or sore throat Respiratory: Denies cough, dyspnea or  wheezes Cardiovascular: Denies palpitation, chest discomfort or lower extremity swelling Gastrointestinal:  Denies nausea, heartburn or change in bowel habits Skin: Denies abnormal skin rashes Lymphatics: Denies new lymphadenopathy or easy bruising Neurological:Denies numbness, tingling or new weaknesses Behavioral/Psych: Mood is stable, no new changes  Breast:  denies any pain or lumps or nodules in either breasts All other systems were reviewed with the patient and are negative.  I have reviewed the past medical history, past surgical history, social history and family history with the patient and they are unchanged from previous note.  ALLERGIES:  has No Known Allergies.  MEDICATIONS:  Current Outpatient Prescriptions  Medication Sig Dispense Refill  . acetaminophen (TYLENOL) 500 MG tablet Take 1,000 mg by mouth every 6 (six) hours as needed. For arthritis pain    . calcium-vitamin D (OSCAL WITH D) 500-200 MG-UNIT per tablet Take 1 tablet by mouth 2 (two) times daily.    . cholecalciferol (VITAMIN D) 1000 UNITS tablet Take 1 tablet (1,000 Units total) by mouth daily. 30 tablet 12  . HYDROcodone-acetaminophen (NORCO/VICODIN) 5-325 MG per tablet Take 1-2 tablets by mouth every 4 (four) hours as needed. (Patient taking differently: Take 1-2 tablets by mouth every 4 (four) hours as needed for moderate pain. ) 10 tablet 0  . letrozole (FEMARA) 2.5 MG tablet Take 1 tablet (2.5 mg total) by mouth daily. 30 tablet 1  . lidocaine-prilocaine (EMLA) cream Apply to port-a-cath one and a half hours before use.  Cover cream with plastic wrap to seal. 30 g 3  . loratadine (CLARITIN) 10 MG tablet Take 1 tablet (10 mg total) by mouth daily. 30 tablet 3  . ondansetron (ZOFRAN) 8 MG tablet Take 1 tablet (  8 mg total) by mouth 2 (two) times daily. Start the day after chemo for 2 days. Then take as needed for nausea or vomiting. 30 tablet 1  . PRESCRIPTION MEDICATION Supportive therapy [facility]    .  prochlorperazine (COMPAZINE) 10 MG tablet Take 1 tablet (10 mg total) by mouth every 6 (six) hours as needed (Nausea or vomiting). 30 tablet 1  . promethazine (PHENERGAN) 25 MG tablet Take 1 tablet (25 mg total) by mouth every 6 (six) hours as needed. For nausea 60 tablet 0  . dexamethasone (DECADRON) 2 MG tablet Take 1 tablet (2 mg total) by mouth daily. 30 tablet 2  . ibuprofen (ADVIL,MOTRIN) 800 MG tablet Take 1 tablet (800 mg total) by mouth every 8 (eight) hours as needed. 30 tablet 3   No current facility-administered medications for this visit.    PHYSICAL EXAMINATION: ECOG PERFORMANCE STATUS: 1 - Symptomatic but completely ambulatory  Filed Vitals:   03/26/15 1340  BP: 136/82  Pulse: 94  Temp: 98.2 F (36.8 C)  Resp: 17   Filed Weights   03/26/15 1340  Weight: 145 lb 4.8 oz (65.908 kg)    GENERAL:alert, no distress and comfortable SKIN: skin color, texture, turgor are normal, no rashes or significant lesions EYES: normal, Conjunctiva are pink and non-injected, sclera clear OROPHARYNX:no exudate, no erythema and lips, buccal mucosa, and tongue normal  NECK: supple, thyroid normal size, non-tender, without nodularity LYMPH:  no palpable lymphadenopathy in the cervical, axillary or inguinal LUNGS: clear to auscultation and percussion with normal breathing effort HEART: regular rate & rhythm and no murmurs and no lower extremity edema ABDOMEN:abdomen soft, non-tender and normal bowel sounds Musculoskeletal:no cyanosis of digits and no clubbing  NEURO: alert & oriented x 3 with fluent speech, no focal motor/sensory deficits  LABORATORY DATA:  I have reviewed the data as listed   Chemistry      Component Value Date/Time   NA 139 03/26/2015 1258   NA 141 02/21/2015 1530   K 3.8 03/26/2015 1258   K 4.0 02/21/2015 1530   CL 102 02/21/2015 1530   CL 104 01/23/2013 1420   CO2 29 03/26/2015 1258   CO2 24 02/21/2015 1530   BUN 4.4* 03/26/2015 1258   BUN 13 02/21/2015 1530    CREATININE 0.6 03/26/2015 1258   CREATININE 0.54 02/21/2015 1530      Component Value Date/Time   CALCIUM 9.4 03/26/2015 1258   CALCIUM 9.5 02/21/2015 1530   ALKPHOS 74 03/26/2015 1258   ALKPHOS 63 02/07/2012 1312   AST 18 03/26/2015 1258   AST 17 02/07/2012 1312   ALT 14 03/26/2015 1258   ALT 15 02/07/2012 1312   BILITOT 0.25 03/26/2015 1258   BILITOT 0.3 02/07/2012 1312       Lab Results  Component Value Date   WBC 2.7* 03/26/2015   HGB 11.2* 03/26/2015   HCT 33.6* 03/26/2015   MCV 80.2 03/26/2015   PLT 245 03/26/2015   NEUTROABS 1.1* 03/26/2015     RADIOGRAPHIC STUDIES: I have personally reviewed the radiology reports and agreed with their findings. No results found.   ASSESSMENT & PLAN:  Breast cancer, left breast Left breast cancer treated with neoadjuvant antiestrogen therapy followed by lumpectomy 11/10/2010 followed by radiation completed 01/11/2011, antiestrogen therapy with Femara 2.5 mg daily started 02/03/2011  PET/CT 03/04/15: Bulky metastatic disease in supraclavicular and Mediastinal LN and 3 hepatic lesions.  Biopsy of the mediastinal LN: Metastatic Breast cancer ER negative, Breast prognostic panel Pending  Treatment  plan: Goal: Palliation; Chemotherapy with Halaven days 1, 8 Q 3 weeks  Current treatment: Cycle 1 day 8 [place]  Blood counts were reviewed and her absolute neutrophil count is 1100. I discussed with her that she cannot receive chemotherapy today. We will postpone her treatment by one week and will administer Neulasta with the next treatment. I will also decrease the dosage of chemotherapy to 1.2 mg/m.  Decreased appetite and taste: I prescribed her low-dose dexamethasone to help boost her appetite. Right and left neck pain: She wanted a prescription for ibuprofen because why couldn't is causing her to be nauseated. I instructed her to take ibuprofen sparingly and alternated with Tylenol.  Monitoring closely for chemotherapy toxicities.    Blood counts are reviewed and they're adequate for treatment  Patient will be participating in Ramadan however she is willing to go through chemotherapy through this. She will however be fasting for that month from morning to evening. I encouraged her to drink lots of water before sunrise and after sunset to keep up on the hydration. If necessary we may have to give her IV fluids.  Return to clinic in 3 weeks for follow up with cycle 2.  No orders of the defined types were placed in this encounter.   The patient has a good understanding of the overall plan. she agrees with it. she will call with any problems that may develop before the next visit here.   Rulon Eisenmenger, MD

## 2015-04-02 ENCOUNTER — Ambulatory Visit (HOSPITAL_BASED_OUTPATIENT_CLINIC_OR_DEPARTMENT_OTHER): Payer: Self-pay

## 2015-04-02 ENCOUNTER — Other Ambulatory Visit (HOSPITAL_BASED_OUTPATIENT_CLINIC_OR_DEPARTMENT_OTHER): Payer: Self-pay

## 2015-04-02 VITALS — BP 142/77 | HR 95 | Temp 98.7°F | Resp 18

## 2015-04-02 DIAGNOSIS — C50912 Malignant neoplasm of unspecified site of left female breast: Secondary | ICD-10-CM

## 2015-04-02 DIAGNOSIS — C787 Secondary malignant neoplasm of liver and intrahepatic bile duct: Secondary | ICD-10-CM

## 2015-04-02 DIAGNOSIS — C778 Secondary and unspecified malignant neoplasm of lymph nodes of multiple regions: Secondary | ICD-10-CM

## 2015-04-02 DIAGNOSIS — Z5189 Encounter for other specified aftercare: Secondary | ICD-10-CM

## 2015-04-02 LAB — COMPREHENSIVE METABOLIC PANEL (CC13)
ALT: 20 U/L (ref 0–55)
AST: 17 U/L (ref 5–34)
Albumin: 3.3 g/dL — ABNORMAL LOW (ref 3.5–5.0)
Alkaline Phosphatase: 85 U/L (ref 40–150)
Anion Gap: 8 mEq/L (ref 3–11)
BILIRUBIN TOTAL: 0.29 mg/dL (ref 0.20–1.20)
BUN: 8.9 mg/dL (ref 7.0–26.0)
CHLORIDE: 104 meq/L (ref 98–109)
CO2: 26 mEq/L (ref 22–29)
CREATININE: 0.7 mg/dL (ref 0.6–1.1)
Calcium: 9.1 mg/dL (ref 8.4–10.4)
EGFR: >90 mL/min/{1.73_m2} (ref >90)
Glucose: 114 mg/dl (ref 70–140)
POTASSIUM: 4.1 meq/L (ref 3.5–5.1)
SODIUM: 138 meq/L (ref 136–145)
Total Protein: 7.2 g/dL (ref 6.4–8.3)

## 2015-04-02 LAB — CBC WITH DIFFERENTIAL/PLATELET
BASO%: 0.6 % (ref 0.0–2.0)
Basophils Absolute: 0 10*3/uL (ref 0.0–0.1)
EOS ABS: 0 10*3/uL (ref 0.0–0.5)
EOS%: 0.1 % (ref 0.0–7.0)
HCT: 37.5 % (ref 34.8–46.6)
HEMOGLOBIN: 12.4 g/dL (ref 11.6–15.9)
LYMPH#: 0.9 10*3/uL (ref 0.9–3.3)
LYMPH%: 19.6 % (ref 14.0–49.7)
MCH: 26.3 pg (ref 25.1–34.0)
MCHC: 33.2 g/dL (ref 31.5–36.0)
MCV: 79.4 fL — AB (ref 79.5–101.0)
MONO#: 0.5 10*3/uL (ref 0.1–0.9)
MONO%: 10.8 % (ref 0.0–14.0)
NEUT#: 3.3 10*3/uL (ref 1.5–6.5)
NEUT%: 68.9 % (ref 38.4–76.8)
Platelets: 304 10*3/uL (ref 145–400)
RBC: 4.72 10*6/uL (ref 3.70–5.45)
RDW: 13.2 % (ref 11.2–14.5)
WBC: 4.8 10*3/uL (ref 3.9–10.3)

## 2015-04-02 LAB — TECHNOLOGIST REVIEW

## 2015-04-02 MED ORDER — HEPARIN SOD (PORK) LOCK FLUSH 100 UNIT/ML IV SOLN
500.0000 [IU] | Freq: Once | INTRAVENOUS | Status: AC | PRN
  Administered 2015-04-02: 500 [IU]
  Filled 2015-04-02: qty 5

## 2015-04-02 MED ORDER — SODIUM CHLORIDE 0.9 % IV SOLN
Freq: Once | INTRAVENOUS | Status: AC
  Administered 2015-04-02: 13:00:00 via INTRAVENOUS

## 2015-04-02 MED ORDER — PALONOSETRON HCL INJECTION 0.25 MG/5ML
0.2500 mg | Freq: Once | INTRAVENOUS | Status: AC
  Administered 2015-04-02: 0.25 mg via INTRAVENOUS

## 2015-04-02 MED ORDER — SODIUM CHLORIDE 0.9 % IV SOLN
Freq: Once | INTRAVENOUS | Status: AC
  Administered 2015-04-02: 13:00:00 via INTRAVENOUS
  Filled 2015-04-02: qty 1

## 2015-04-02 MED ORDER — SODIUM CHLORIDE 0.9 % IJ SOLN
10.0000 mL | INTRAMUSCULAR | Status: DC | PRN
  Administered 2015-04-02: 10 mL
  Filled 2015-04-02: qty 10

## 2015-04-02 MED ORDER — PEGFILGRASTIM 6 MG/0.6ML ~~LOC~~ PSKT
6.0000 mg | PREFILLED_SYRINGE | Freq: Once | SUBCUTANEOUS | Status: AC
  Administered 2015-04-02: 6 mg via SUBCUTANEOUS
  Filled 2015-04-02: qty 0.6

## 2015-04-02 MED ORDER — ERIBULIN MESYLATE CHEMO INJECTION 1 MG/2ML
2.0000 mg | Freq: Once | INTRAVENOUS | Status: AC
  Administered 2015-04-02: 2 mg via INTRAVENOUS
  Filled 2015-04-02: qty 4

## 2015-04-02 MED ORDER — PALONOSETRON HCL INJECTION 0.25 MG/5ML
INTRAVENOUS | Status: AC
  Filled 2015-04-02: qty 5

## 2015-04-02 NOTE — Patient Instructions (Signed)
[facility] Discharge Instructions for Patients Receiving Chemotherapy  Today you received the following chemotherapy agents: [place]  To help prevent nausea and vomiting after your treatment, we encourage you to take your nausea medication as directed.    If you develop nausea and vomiting that is not controlled by your nausea medication, call the clinic.   BELOW ARE SYMPTOMS THAT SHOULD BE REPORTED IMMEDIATELY:  *FEVER GREATER THAN 100.5 F  *CHILLS WITH OR WITHOUT FEVER  NAUSEA AND VOMITING THAT IS NOT CONTROLLED WITH YOUR NAUSEA MEDICATION  *UNUSUAL SHORTNESS OF BREATH  *UNUSUAL BRUISING OR BLEEDING  TENDERNESS IN MOUTH AND THROAT WITH OR WITHOUT PRESENCE OF ULCERS  *URINARY PROBLEMS  *BOWEL PROBLEMS  UNUSUAL RASH Items with * indicate a potential emergency and should be followed up as soon as possible.  Feel free to call the clinic you have any questions or concerns. The clinic phone number is [phone].  Please show the CHEMO ALERT CARD at check-in to the Emergency Department and triage nurse.   

## 2015-04-09 ENCOUNTER — Ambulatory Visit: Payer: Self-pay

## 2015-04-09 ENCOUNTER — Other Ambulatory Visit (HOSPITAL_BASED_OUTPATIENT_CLINIC_OR_DEPARTMENT_OTHER): Payer: Self-pay

## 2015-04-09 ENCOUNTER — Ambulatory Visit (HOSPITAL_BASED_OUTPATIENT_CLINIC_OR_DEPARTMENT_OTHER): Payer: Self-pay

## 2015-04-09 VITALS — BP 145/79 | HR 90 | Temp 98.9°F

## 2015-04-09 DIAGNOSIS — C787 Secondary malignant neoplasm of liver and intrahepatic bile duct: Secondary | ICD-10-CM

## 2015-04-09 DIAGNOSIS — Z5111 Encounter for antineoplastic chemotherapy: Secondary | ICD-10-CM

## 2015-04-09 DIAGNOSIS — C50912 Malignant neoplasm of unspecified site of left female breast: Secondary | ICD-10-CM

## 2015-04-09 DIAGNOSIS — C778 Secondary and unspecified malignant neoplasm of lymph nodes of multiple regions: Secondary | ICD-10-CM

## 2015-04-09 DIAGNOSIS — C50512 Malignant neoplasm of lower-outer quadrant of left female breast: Secondary | ICD-10-CM

## 2015-04-09 LAB — CBC WITH DIFFERENTIAL/PLATELET
BASO%: 0.4 % (ref 0.0–2.0)
Basophils Absolute: 0.1 10*3/uL (ref 0.0–0.1)
EOS%: 0 % (ref 0.0–7.0)
Eosinophils Absolute: 0 10*3/uL (ref 0.0–0.5)
HCT: 33.4 % — ABNORMAL LOW (ref 34.8–46.6)
HEMOGLOBIN: 10.9 g/dL — AB (ref 11.6–15.9)
LYMPH%: 4.5 % — ABNORMAL LOW (ref 14.0–49.7)
MCH: 25.8 pg (ref 25.1–34.0)
MCHC: 32.6 g/dL (ref 31.5–36.0)
MCV: 79.2 fL — AB (ref 79.5–101.0)
MONO#: 0.9 10*3/uL (ref 0.1–0.9)
MONO%: 3.7 % (ref 0.0–14.0)
NEUT#: 23.3 10*3/uL — ABNORMAL HIGH (ref 1.5–6.5)
NEUT%: 91.4 % — ABNORMAL HIGH (ref 38.4–76.8)
Platelets: 256 10*3/uL (ref 145–400)
RBC: 4.21 10*6/uL (ref 3.70–5.45)
RDW: 13.7 % (ref 11.2–14.5)
WBC: 25.5 10*3/uL — ABNORMAL HIGH (ref 3.9–10.3)
lymph#: 1.1 10*3/uL (ref 0.9–3.3)

## 2015-04-09 LAB — COMPREHENSIVE METABOLIC PANEL (CC13)
ALK PHOS: 187 U/L — AB (ref 40–150)
ALT: 17 U/L (ref 0–55)
AST: 14 U/L (ref 5–34)
Albumin: 3.2 g/dL — ABNORMAL LOW (ref 3.5–5.0)
Anion Gap: 11 mEq/L (ref 3–11)
BILIRUBIN TOTAL: 0.25 mg/dL (ref 0.20–1.20)
BUN: 6.5 mg/dL — AB (ref 7.0–26.0)
CO2: 26 mEq/L (ref 22–29)
Calcium: 8.9 mg/dL (ref 8.4–10.4)
Chloride: 105 mEq/L (ref 98–109)
Creatinine: 0.8 mg/dL (ref 0.6–1.1)
Glucose: 118 mg/dl (ref 70–140)
POTASSIUM: 3.4 meq/L — AB (ref 3.5–5.1)
Sodium: 141 mEq/L (ref 136–145)
Total Protein: 6.5 g/dL (ref 6.4–8.3)

## 2015-04-09 MED ORDER — SODIUM CHLORIDE 0.9 % IV SOLN
Freq: Once | INTRAVENOUS | Status: AC
  Administered 2015-04-09: 11:00:00 via INTRAVENOUS
  Filled 2015-04-09: qty 1

## 2015-04-09 MED ORDER — SODIUM CHLORIDE 0.9 % IV SOLN
Freq: Once | INTRAVENOUS | Status: AC
  Administered 2015-04-09: 11:00:00 via INTRAVENOUS

## 2015-04-09 MED ORDER — SODIUM CHLORIDE 0.9 % IJ SOLN
10.0000 mL | INTRAMUSCULAR | Status: DC | PRN
  Administered 2015-04-09: 10 mL
  Filled 2015-04-09: qty 10

## 2015-04-09 MED ORDER — SODIUM CHLORIDE 0.9 % IV SOLN
1.1500 mg/m2 | Freq: Once | INTRAVENOUS | Status: AC
  Administered 2015-04-09: 2 mg via INTRAVENOUS
  Filled 2015-04-09: qty 4

## 2015-04-09 MED ORDER — PALONOSETRON HCL INJECTION 0.25 MG/5ML
INTRAVENOUS | Status: AC
  Filled 2015-04-09: qty 5

## 2015-04-09 MED ORDER — PALONOSETRON HCL INJECTION 0.25 MG/5ML
0.2500 mg | Freq: Once | INTRAVENOUS | Status: AC
  Administered 2015-04-09: 0.25 mg via INTRAVENOUS

## 2015-04-09 MED ORDER — HEPARIN SOD (PORK) LOCK FLUSH 100 UNIT/ML IV SOLN
500.0000 [IU] | Freq: Once | INTRAVENOUS | Status: AC | PRN
  Administered 2015-04-09: 500 [IU]
  Filled 2015-04-09: qty 5

## 2015-04-09 NOTE — Patient Instructions (Signed)
[facility] Discharge Instructions for Patients Receiving Chemotherapy  Today you received the following chemotherapy agents:  eribulin (halaven)  To help prevent nausea and vomiting after your treatment, we encourage you to take your nausea medication.  Take it as often as prescribed.     If you develop nausea and vomiting that is not controlled by your nausea medication, call the clinic. If it is after clinic hours your family physician or the after hours number for the clinic or go to the Emergency Department.   BELOW ARE SYMPTOMS THAT SHOULD BE REPORTED IMMEDIATELY:  *FEVER GREATER THAN 100.5 F  *CHILLS WITH OR WITHOUT FEVER  NAUSEA AND VOMITING THAT IS NOT CONTROLLED WITH YOUR NAUSEA MEDICATION  *UNUSUAL SHORTNESS OF BREATH  *UNUSUAL BRUISING OR BLEEDING  TENDERNESS IN MOUTH AND THROAT WITH OR WITHOUT PRESENCE OF ULCERS  *URINARY PROBLEMS  *BOWEL PROBLEMS  UNUSUAL RASH Items with * indicate a potential emergency and should be followed up as soon as possible.  One of the nurses will contact you 24 hours after your treatment. Please let the nurse know about any problems that you may have experienced. Feel free to call the clinic you have any questions or concerns. The clinic phone number is [phone].   I have been informed and understand all the instructions given to me. I know to contact the clinic, my physician, or go to the Emergency Department if any problems should occur. I do not have any questions at this time, but understand that I may call the clinic during office hours   should I have any questions or need assistance in obtaining follow up care.    __________________________________________  _____________  __________ Signature of Patient or Authorized Representative            Date                   Time    __________________________________________ Nurse's Signature    Eribulin solution for injection (halaven) What is this  medicine? ERIBULIN is a chemotherapy drug. It is used to treat breast cancer. This medicine may be used for other purposes; ask your health care provider or pharmacist if you have questions. COMMON BRAND NAME(S): Halaven What should I tell my health care provider before I take this medicine? They need to know if you have any of these conditions: -heart disease -kidney disease -liver disease -low blood counts, like low white cell, platelet, or red cell counts -an unusual or allergic reaction to eribulin, other medicines, foods, dyes, or preservatives -pregnant or trying to get pregnant -breast-feeding How should I use this medicine? This medicine is for infusion into a vein. It is given by a health care professional in a hospital or clinic setting. Talk to your pediatrician regarding the use of this medicine in children. Special care may be needed. Overdosage: If you think you've taken too much of this medicine contact a poison control center or emergency room at once. Overdosage: If you think you have taken too much of this medicine contact a poison control center or emergency room at once. NOTE: This medicine is only for you. Do not share this medicine with others. What if I miss a dose? It is important not to miss your dose. Call your doctor or health care professional if you are unable to keep an appointment. What may interact with this medicine? Do not take this medicine with any of the following medications: -amiodarone -astemizole -arsenic trioxide -bepridil -bretylium -chloroquine -  chlorpromazine -cisapride -clarithromycin -dextromethorphan,  quinidine -disopyramide -dofetilide -droperidol -dronedarone -erythromycin -grepafloxacin -halofantrine -haloperidol -ibutilide -levomethadyl -mesoridazine -methadone -pentamidine -procainamide -quinidine -pimozide -posaconazole -probucol -propafenone -saquinavir -sotalol -sparfloxacin -terfenadine -thioridazine -troleandomycin -ziprasidone This list may not describe all possible interactions. Give your health care provider a list of all the medicines, herbs, non-prescription drugs, or dietary supplements you use. Also tell them if you smoke, drink alcohol, or use illegal drugs. Some items may interact with your medicine. What should I watch for while using this medicine? Your condition will be monitored carefully while you are receiving this medicine. This drug may make you feel generally unwell. This is not uncommon, as chemotherapy can affect healthy cells as well as cancer cells. Report any side effects. Continue your course of treatment even though you feel ill unless your doctor tells you to stop. Call your doctor or health care professional for advice if you get a fever, chills or sore throat, or other symptoms of a cold or flu. Do not treat yourself. This drug decreases your body's ability to fight infections. Try to avoid being around people who are sick. This medicine may increase your risk to bruise or bleed. Call your doctor or health care professional if you notice any unusual bleeding. Be careful brushing and flossing your teeth or using a toothpick because you may get an infection or bleed more easily. If you have any dental work done, tell your dentist you are receiving this medicine. Avoid taking products that contain aspirin, acetaminophen, ibuprofen, naproxen, or ketoprofen unless instructed by your doctor. These medicines may hide a fever. Do not become pregnant while taking this medicine. Women should inform their doctor if they wish to become pregnant or think  they might be pregnant. There is a potential for serious side effects to an unborn child. Talk to your health care professional or pharmacist for more information. Do not breast-feed an infant while taking this medicine. What side effects may I notice from receiving this medicine? Side effects that you should report to your doctor or health care professional as soon as possible: -allergic reactions like skin rash, itching or hives, swelling of the face, lips, or tongue -low blood counts - this medicine may decrease the number of white blood cells, red blood cells and platelets. You may be at increased risk for infections and bleeding. -signs of infection - fever or chills, cough, sore throat, pain or difficulty passing urine -signs of decreased platelets or bleeding - bruising, pinpoint red spots on the skin, black, tarry stools, blood in the urine -signs of decreased red blood cells - unusually weak or tired, fainting spells, lightheadedness -pain, tingling, numbness in the hands or feet Side effects that usually do not require medical attention (Report these to your doctor or health care professional if they continue or are bothersome.): -constipation -hair loss -headache -loss of appetite -muscle or joint pain -nausea, vomiting -stomach pain This list may not describe all possible side effects. Call your doctor for medical advice about side effects. You may report side effects to FDA at 1-800-FDA-1088. Where should I keep my medicine? This drug is given in a hospital or clinic and will not be stored at home. NOTE: This sheet is a summary. It may not cover all possible information. If you have questions about this medicine, talk to your doctor, pharmacist, or health care provider.  2015, [employer]. (2009-10-02 23:04:37)

## 2015-04-15 ENCOUNTER — Other Ambulatory Visit: Payer: Self-pay | Admitting: *Deleted

## 2015-04-15 NOTE — Assessment & Plan Note (Signed)
Left breast cancer treated with neoadjuvant antiestrogen therapy followed by lumpectomy 11/10/2010 followed by radiation completed 01/11/2011, antiestrogen therapy with Femara 2.5 mg daily started 02/03/2011, diagnosed with metastatic disease May 2016 PET/CT 03/04/15: Bulky metastatic disease in supraclavicular and Mediastinal LN and 3 hepatic lesions.  Biopsy of the mediastinal LN: Metastatic Breast cancer ER 0%, PR 0%, Ki 67 60%, Her 2 Neg  Treatment plan: Goal: Palliation; Chemotherapy with Halaven days 1, 8 Q 3 weeks  Current treatment: Cycle 1 day 8 Halaven started 03/19/15; Today is cycle 2 day 8.  Chemo Toxicities: 1. Neutropenia: required dose reduction to 1.2 mg/m after cycle 1 and patient was given neulasta after day 8 of chemo 2. Decreased appetite and taste: I prescribed her low-dose dexamethasone to help boost her appetite. 3. Right and left neck pain: On Ibuprofen and tylenol  Monitoring closely for chemotherapy toxicities.

## 2015-04-16 ENCOUNTER — Other Ambulatory Visit (HOSPITAL_BASED_OUTPATIENT_CLINIC_OR_DEPARTMENT_OTHER): Payer: Self-pay

## 2015-04-16 ENCOUNTER — Ambulatory Visit (HOSPITAL_BASED_OUTPATIENT_CLINIC_OR_DEPARTMENT_OTHER): Payer: Self-pay | Admitting: Hematology and Oncology

## 2015-04-16 ENCOUNTER — Encounter: Payer: Self-pay | Admitting: Hematology and Oncology

## 2015-04-16 ENCOUNTER — Ambulatory Visit: Payer: Self-pay

## 2015-04-16 ENCOUNTER — Other Ambulatory Visit: Payer: Self-pay

## 2015-04-16 ENCOUNTER — Telehealth: Payer: Self-pay | Admitting: Hematology and Oncology

## 2015-04-16 ENCOUNTER — Ambulatory Visit: Payer: Self-pay | Admitting: Hematology and Oncology

## 2015-04-16 ENCOUNTER — Ambulatory Visit (HOSPITAL_BASED_OUTPATIENT_CLINIC_OR_DEPARTMENT_OTHER): Payer: Self-pay

## 2015-04-16 VITALS — BP 126/79 | HR 86 | Temp 99.4°F | Resp 18 | Ht 65.0 in | Wt 147.5 lb

## 2015-04-16 DIAGNOSIS — K219 Gastro-esophageal reflux disease without esophagitis: Secondary | ICD-10-CM

## 2015-04-16 DIAGNOSIS — R438 Other disturbances of smell and taste: Secondary | ICD-10-CM

## 2015-04-16 DIAGNOSIS — Z5189 Encounter for other specified aftercare: Secondary | ICD-10-CM

## 2015-04-16 DIAGNOSIS — D701 Agranulocytosis secondary to cancer chemotherapy: Secondary | ICD-10-CM

## 2015-04-16 DIAGNOSIS — C50912 Malignant neoplasm of unspecified site of left female breast: Secondary | ICD-10-CM

## 2015-04-16 DIAGNOSIS — C778 Secondary and unspecified malignant neoplasm of lymph nodes of multiple regions: Secondary | ICD-10-CM

## 2015-04-16 DIAGNOSIS — C787 Secondary malignant neoplasm of liver and intrahepatic bile duct: Secondary | ICD-10-CM

## 2015-04-16 DIAGNOSIS — D649 Anemia, unspecified: Secondary | ICD-10-CM

## 2015-04-16 DIAGNOSIS — Z452 Encounter for adjustment and management of vascular access device: Secondary | ICD-10-CM

## 2015-04-16 DIAGNOSIS — M542 Cervicalgia: Secondary | ICD-10-CM

## 2015-04-16 DIAGNOSIS — R0789 Other chest pain: Secondary | ICD-10-CM

## 2015-04-16 LAB — CBC WITH DIFFERENTIAL/PLATELET
BASO%: 0.6 % (ref 0.0–2.0)
BASOS ABS: 0 10*3/uL (ref 0.0–0.1)
EOS ABS: 0 10*3/uL (ref 0.0–0.5)
EOS%: 0 % (ref 0.0–7.0)
HEMATOCRIT: 30.8 % — AB (ref 34.8–46.6)
HEMOGLOBIN: 10.3 g/dL — AB (ref 11.6–15.9)
LYMPH%: 28.4 % (ref 14.0–49.7)
MCH: 26.5 pg (ref 25.1–34.0)
MCHC: 33.2 g/dL (ref 31.5–36.0)
MCV: 79.6 fL (ref 79.5–101.0)
MONO#: 0.4 10*3/uL (ref 0.1–0.9)
MONO%: 10.8 % (ref 0.0–14.0)
NEUT%: 60.2 % (ref 38.4–76.8)
NEUTROS ABS: 2.2 10*3/uL (ref 1.5–6.5)
PLATELETS: 203 10*3/uL (ref 145–400)
RBC: 3.87 10*6/uL (ref 3.70–5.45)
RDW: 13.7 % (ref 11.2–14.5)
WBC: 3.6 10*3/uL — ABNORMAL LOW (ref 3.9–10.3)
lymph#: 1 10*3/uL (ref 0.9–3.3)

## 2015-04-16 LAB — COMPREHENSIVE METABOLIC PANEL (CC13)
ALT: 17 U/L (ref 0–55)
ANION GAP: 7 meq/L (ref 3–11)
AST: 10 U/L (ref 5–34)
Albumin: 3.2 g/dL — ABNORMAL LOW (ref 3.5–5.0)
Alkaline Phosphatase: 98 U/L (ref 40–150)
BUN: 8.2 mg/dL (ref 7.0–26.0)
CALCIUM: 8.6 mg/dL (ref 8.4–10.4)
CO2: 28 meq/L (ref 22–29)
CREATININE: 0.7 mg/dL (ref 0.6–1.1)
Chloride: 103 mEq/L (ref 98–109)
Glucose: 172 mg/dl — ABNORMAL HIGH (ref 70–140)
Potassium: 3.7 mEq/L (ref 3.5–5.1)
Sodium: 138 mEq/L (ref 136–145)
Total Bilirubin: 0.28 mg/dL (ref 0.20–1.20)
Total Protein: 6 g/dL — ABNORMAL LOW (ref 6.4–8.3)

## 2015-04-16 MED ORDER — HEPARIN SOD (PORK) LOCK FLUSH 100 UNIT/ML IV SOLN
500.0000 [IU] | Freq: Once | INTRAVENOUS | Status: AC | PRN
  Administered 2015-04-16: 500 [IU]
  Filled 2015-04-16: qty 5

## 2015-04-16 MED ORDER — PALONOSETRON HCL INJECTION 0.25 MG/5ML
0.2500 mg | Freq: Once | INTRAVENOUS | Status: AC
  Administered 2015-04-16: 0.25 mg via INTRAVENOUS

## 2015-04-16 MED ORDER — SODIUM CHLORIDE 0.9 % IV SOLN
Freq: Once | INTRAVENOUS | Status: AC
  Administered 2015-04-16: 14:00:00 via INTRAVENOUS
  Filled 2015-04-16: qty 1

## 2015-04-16 MED ORDER — ALTEPLASE 2 MG IJ SOLR
2.0000 mg | Freq: Once | INTRAMUSCULAR | Status: AC | PRN
  Administered 2015-04-16: 2 mg
  Filled 2015-04-16: qty 2

## 2015-04-16 MED ORDER — SODIUM CHLORIDE 0.9 % IV SOLN
Freq: Once | INTRAVENOUS | Status: AC
  Administered 2015-04-16: 14:00:00 via INTRAVENOUS

## 2015-04-16 MED ORDER — PALONOSETRON HCL INJECTION 0.25 MG/5ML
INTRAVENOUS | Status: AC
  Filled 2015-04-16: qty 5

## 2015-04-16 MED ORDER — PEGFILGRASTIM 6 MG/0.6ML ~~LOC~~ PSKT
6.0000 mg | PREFILLED_SYRINGE | Freq: Once | SUBCUTANEOUS | Status: AC
  Administered 2015-04-16: 6 mg via SUBCUTANEOUS
  Filled 2015-04-16: qty 0.6

## 2015-04-16 MED ORDER — SODIUM CHLORIDE 0.9 % IV SOLN
1.1500 mg/m2 | Freq: Once | INTRAVENOUS | Status: AC
  Administered 2015-04-16: 2 mg via INTRAVENOUS
  Filled 2015-04-16: qty 4

## 2015-04-16 MED ORDER — SODIUM CHLORIDE 0.9 % IJ SOLN
10.0000 mL | INTRAMUSCULAR | Status: DC | PRN
  Administered 2015-04-16: 10 mL
  Filled 2015-04-16: qty 10

## 2015-04-16 NOTE — Progress Notes (Signed)
Patient Care Team: Tresa Garter, MD as PCP - General (Internal Medicine)  DIAGNOSIS: No matching staging information was found for the patient.  SUMMARY OF ONCOLOGIC HISTORY:   Breast cancer, left breast   04/06/2010 - 10/26/2010 Anti-estrogen oral therapy Neoadjuvant antiestrogen therapy   11/10/2010 Surgery Left breast lumpectomy: Invasive ductal carcinoma 2 cm, grade 3, angiolymphatic invasion present, 6 lymph nodes negative, ER 99%, PR 90%, Ki-67 20%, HER-2 negative ratio 1.22   12/01/2010 - 01/11/2011 Radiation Therapy Adjuvant radiation therapy   02/03/2011 -  Anti-estrogen oral therapy Femara 2.5 mg daily   02/22/2015 Imaging CT chest revealed 6 cm mediastinal mass, right hilar lymph nodes, base of neck lesions in both right and left, subtle liver metastases, periportal lymph nodes   02/28/2015 PET scan Bulky bilateral hypermetabolic supraclavicular and mediastinal lymphadenopathy consistent with eoplasm/metastatic disease. Three hepatic metastatic lesions   03/04/2015 Initial Biopsy Mediastinal mass Biopsy: Invasive Mammary cancer ER Neg   03/19/2015 -  Chemotherapy Halaven day 1 and day 8 every 3 weeks    CHIEF COMPLIANT: Cycle 2 day 8 Halaven  INTERVAL HISTORY: Michele Hale is a 69 year old with above-mentioned history of metastatic breast cancer currently on Halloween. She is tolerating it much better. She complains of intermittent chest discomfort radiating to the back. This is especially later in the evening. Denies any nausea vomiting. Denies any diarrhea or constipation. The neck and shoulder pains have much improved.  REVIEW OF SYSTEMS:   Constitutional: Denies fevers, chills or abnormal weight loss Eyes: Denies blurriness of vision Ears, nose, mouth, throat, and face: Denies mucositis or sore throat Respiratory: Denies cough, dyspnea or wheezes Cardiovascular: Chest discomfort radiating to the back especially at night Gastrointestinal:  Denies nausea, heartburn or change  in bowel habits Skin: Denies abnormal skin rashes Lymphatics: Denies new lymphadenopathy or easy bruising Neurological:Denies numbness, tingling or new weaknesses Behavioral/Psych: Mood is stable, no new changes  Breast:  denies any pain or lumps or nodules in either breasts All other systems were reviewed with the patient and are negative.  I have reviewed the past medical history, past surgical history, social history and family history with the patient and they are unchanged from previous note.  ALLERGIES:  has No Known Allergies.  MEDICATIONS:  Current Outpatient Prescriptions  Medication Sig Dispense Refill  . acetaminophen (TYLENOL) 500 MG tablet Take 1,000 mg by mouth every 6 (six) hours as needed. For arthritis pain    . calcium-vitamin D (OSCAL WITH D) 500-200 MG-UNIT per tablet Take 1 tablet by mouth 2 (two) times daily.    . cholecalciferol (VITAMIN D) 1000 UNITS tablet Take 1 tablet (1,000 Units total) by mouth daily. 30 tablet 12  . dexamethasone (DECADRON) 2 MG tablet Take 1 tablet (2 mg total) by mouth daily. 30 tablet 2  . HYDROcodone-acetaminophen (NORCO/VICODIN) 5-325 MG per tablet Take 1-2 tablets by mouth every 4 (four) hours as needed. (Patient taking differently: Take 1-2 tablets by mouth every 4 (four) hours as needed for moderate pain. ) 10 tablet 0  . ibuprofen (ADVIL,MOTRIN) 800 MG tablet Take 1 tablet (800 mg total) by mouth every 8 (eight) hours as needed. 30 tablet 3  . lidocaine-prilocaine (EMLA) cream Apply to port-a-cath one and a half hours before use.  Cover cream with plastic wrap to seal. 30 g 3  . loratadine (CLARITIN) 10 MG tablet Take 1 tablet (10 mg total) by mouth daily. 30 tablet 3  . ondansetron (ZOFRAN) 8 MG tablet Take 1 tablet (8 mg  total) by mouth 2 (two) times daily. Start the day after chemo for 2 days. Then take as needed for nausea or vomiting. 30 tablet 1  . PRESCRIPTION MEDICATION Supportive therapy [facility]    . prochlorperazine (COMPAZINE)  10 MG tablet Take 1 tablet (10 mg total) by mouth every 6 (six) hours as needed (Nausea or vomiting). 30 tablet 1  . promethazine (PHENERGAN) 25 MG tablet Take 1 tablet (25 mg total) by mouth every 6 (six) hours as needed. For nausea 60 tablet 0   No current facility-administered medications for this visit.    PHYSICAL EXAMINATION: ECOG PERFORMANCE STATUS: 1 - Symptomatic but completely ambulatory  Filed Vitals:   04/16/15 1120  BP: 126/79  Pulse: 86  Temp: 99.4 F (37.4 C)  Resp: 18   Filed Weights   04/16/15 1120  Weight: 147 lb 8 oz (66.906 kg)    GENERAL:alert, no distress and comfortable SKIN: skin color, texture, turgor are normal, no rashes or significant lesions EYES: normal, Conjunctiva are pink and non-injected, sclera clear OROPHARYNX:no exudate, no erythema and lips, buccal mucosa, and tongue normal  NECK: supple, thyroid normal size, non-tender, without nodularity LYMPH:  no palpable lymphadenopathy in the cervical, axillary or inguinal LUNGS: clear to auscultation and percussion with normal breathing effort HEART: regular rate & rhythm and no murmurs and no lower extremity edema ABDOMEN:abdomen soft, non-tender and normal bowel sounds Musculoskeletal:no cyanosis of digits and no clubbing  NEURO: alert & oriented x 3 with fluent speech, no focal motor/sensory deficits LABORATORY DATA:  I have reviewed the data as listed   Chemistry      Component Value Date/Time   NA 138 04/16/2015 1106   NA 141 02/21/2015 1530   K 3.7 04/16/2015 1106   K 4.0 02/21/2015 1530   CL 102 02/21/2015 1530   CL 104 01/23/2013 1420   CO2 28 04/16/2015 1106   CO2 24 02/21/2015 1530   BUN 8.2 04/16/2015 1106   BUN 13 02/21/2015 1530   CREATININE 0.7 04/16/2015 1106   CREATININE 0.54 02/21/2015 1530      Component Value Date/Time   CALCIUM 8.6 04/16/2015 1106   CALCIUM 9.5 02/21/2015 1530   ALKPHOS 98 04/16/2015 1106   ALKPHOS 63 02/07/2012 1312   AST 10 04/16/2015 1106    AST 17 02/07/2012 1312   ALT 17 04/16/2015 1106   ALT 15 02/07/2012 1312   BILITOT 0.28 04/16/2015 1106   BILITOT 0.3 02/07/2012 1312       Lab Results  Component Value Date   WBC 3.6* 04/16/2015   HGB 10.3* 04/16/2015   HCT 30.8* 04/16/2015   MCV 79.6 04/16/2015   PLT 203 04/16/2015   NEUTROABS 2.2 04/16/2015    ASSESSMENT & PLAN:  Breast cancer, left breast Left breast cancer treated with neoadjuvant antiestrogen therapy followed by lumpectomy 11/10/2010 followed by radiation completed 01/11/2011, antiestrogen therapy with Femara 2.5 mg daily started 02/03/2011, diagnosed with metastatic disease May 2016 PET/CT 03/04/15: Bulky metastatic disease in supraclavicular and Mediastinal LN and 3 hepatic lesions.  Biopsy of the mediastinal LN: Metastatic Breast cancer ER 0%, PR 0%, Ki 67 60%, Her 2 Neg  Treatment plan: Goal: Palliation; Chemotherapy with Halaven days 1, 8 Q 3 weeks  Current treatment: Cycle 1 day 8 Halaven started 03/19/15; Today is cycle 2 day 8. (There has been a variation in her treatment schedule because of dose delay after cycle 1 day 8. This has now been corrected and today will be cycle 2  day 8 with Neulasta)  Chemo Toxicities: 1. Neutropenia: required dose reduction to 1.2 mg/m after cycle 1 and patient was given neulasta after day 8 of chemo 2. Decreased appetite and taste: I prescribed her low-dose dexamethasone to help boost her appetite. 3. Right and left neck pain: On Ibuprofen and tylenol 4. Intermittent chest pains radiating to the back: I suspect this may be related to acid reflux. I instructed her to take Pepto-Bismol to see if it gives her relief.  4.  Normocytic to mild microcytic anemia hemoglobin 10.3 being observed, MCV 79.6  Patient reports that her neck and shoulder pains have much improved and she is not in too much discomfort. She does have acid reflux like symptoms. Monitoring closely for chemotherapy toxicities.  The patient has a good  understanding of the overall plan. she agrees with it. she will call with any problems that may develop before the next visit here.   Rulon Eisenmenger, MD

## 2015-04-16 NOTE — Telephone Encounter (Signed)
Appointments made and avs printed for patient °

## 2015-04-16 NOTE — Patient Instructions (Signed)
[facility] Discharge Instructions for Patients Receiving Chemotherapy  Today you received the following chemotherapy agents: Halaven  To help prevent nausea and vomiting after your treatment, we encourage you to take your nausea medication as directed.    If you develop nausea and vomiting that is not controlled by your nausea medication, call the clinic.   BELOW ARE SYMPTOMS THAT SHOULD BE REPORTED IMMEDIATELY:  *FEVER GREATER THAN 100.5 F  *CHILLS WITH OR WITHOUT FEVER  NAUSEA AND VOMITING THAT IS NOT CONTROLLED WITH YOUR NAUSEA MEDICATION  *UNUSUAL SHORTNESS OF BREATH  *UNUSUAL BRUISING OR BLEEDING  TENDERNESS IN MOUTH AND THROAT WITH OR WITHOUT PRESENCE OF ULCERS  *URINARY PROBLEMS  *BOWEL PROBLEMS  UNUSUAL RASH Items with * indicate a potential emergency and should be followed up as soon as possible.  Feel free to call the clinic you have any questions or concerns. The clinic phone number is [phone].  Please show the CHEMO ALERT CARD at check-in to the Emergency Department and triage nurse.   

## 2015-04-24 ENCOUNTER — Telehealth: Payer: Self-pay | Admitting: *Deleted

## 2015-04-24 NOTE — Telephone Encounter (Signed)
Called patient's daughter to f/u with call to on call service about mouth sores. Magic mouthwash was called to [facility] and daughter states that the patient is using it and is doing better. Advised daughter to call with any questions or concerns. She verbalized understanding.

## 2015-04-24 NOTE — Telephone Encounter (Signed)
Received telephone advice record from [facility], sent to scan.  

## 2015-04-29 ENCOUNTER — Telehealth: Payer: Self-pay | Admitting: Hematology and Oncology

## 2015-04-29 NOTE — Telephone Encounter (Signed)
Patients daughter stopped by to move her mothers appointment  due to an even that she needs  attend am on 7/8,moved appointments from am to pm

## 2015-04-29 NOTE — Assessment & Plan Note (Signed)
Left breast cancer treated with neoadjuvant antiestrogen therapy followed by lumpectomy 11/10/2010 followed by radiation completed 01/11/2011, antiestrogen therapy with Femara 2.5 mg daily started 02/03/2011, diagnosed with metastatic disease May 2016 PET/CT 03/04/15: Bulky metastatic disease in supraclavicular and Mediastinal LN and 3 hepatic lesions.  Biopsy of the mediastinal LN: Metastatic Breast cancer ER 0%, PR 0%, Ki 67 60%, Her 2 Neg  Treatment plan: Goal: Palliation; Chemotherapy with Halaven days 1, 8 Q 3 weeks  Current treatment: Cycle 1 day 8 Halaven started 03/19/15; Today is cycle 2 day 8. (There has been a variation in her treatment schedule because of dose delay after cycle 1 day 8. This has now been corrected and today will be cycle 3 day 1 with Neulasta)  Chemo Toxicities: 1. Neutropenia: required dose reduction to 1.2 mg/m after cycle 1 and patient was given neulasta after day 8 of chemo 2. Decreased appetite and taste: I prescribed her low-dose dexamethasone to help boost her appetite. 3. Right and left neck pain: On Ibuprofen and tylenol 4. Intermittent chest pains radiating to the back: I suspect this may be related to acid reflux. I instructed her to take Pepto-Bismol to see if it gives her relief.  4. Normocytic to mild microcytic anemia hemoglobin 10.3 being observed, MCV 79.6  Patient reports that her neck and shoulder pains have much improved and she is not in too much discomfort. She does have acid reflux like symptoms. Scans after this cycle will be done Monitoring closely for chemotherapy toxicities

## 2015-04-30 ENCOUNTER — Ambulatory Visit (HOSPITAL_BASED_OUTPATIENT_CLINIC_OR_DEPARTMENT_OTHER): Payer: Self-pay

## 2015-04-30 ENCOUNTER — Ambulatory Visit (HOSPITAL_BASED_OUTPATIENT_CLINIC_OR_DEPARTMENT_OTHER): Payer: Self-pay | Admitting: Hematology and Oncology

## 2015-04-30 ENCOUNTER — Encounter: Payer: Self-pay | Admitting: Hematology and Oncology

## 2015-04-30 ENCOUNTER — Other Ambulatory Visit (HOSPITAL_BASED_OUTPATIENT_CLINIC_OR_DEPARTMENT_OTHER): Payer: Self-pay

## 2015-04-30 ENCOUNTER — Other Ambulatory Visit: Payer: Self-pay

## 2015-04-30 ENCOUNTER — Telehealth: Payer: Self-pay | Admitting: Hematology and Oncology

## 2015-04-30 ENCOUNTER — Ambulatory Visit: Payer: Self-pay | Admitting: Hematology and Oncology

## 2015-04-30 VITALS — BP 130/78 | HR 98 | Temp 98.7°F | Resp 18 | Ht 65.0 in | Wt 142.0 lb

## 2015-04-30 DIAGNOSIS — D701 Agranulocytosis secondary to cancer chemotherapy: Secondary | ICD-10-CM

## 2015-04-30 DIAGNOSIS — C778 Secondary and unspecified malignant neoplasm of lymph nodes of multiple regions: Secondary | ICD-10-CM

## 2015-04-30 DIAGNOSIS — C50912 Malignant neoplasm of unspecified site of left female breast: Secondary | ICD-10-CM

## 2015-04-30 DIAGNOSIS — C787 Secondary malignant neoplasm of liver and intrahepatic bile duct: Secondary | ICD-10-CM

## 2015-04-30 DIAGNOSIS — C50919 Malignant neoplasm of unspecified site of unspecified female breast: Secondary | ICD-10-CM

## 2015-04-30 DIAGNOSIS — M542 Cervicalgia: Secondary | ICD-10-CM

## 2015-04-30 DIAGNOSIS — Z5111 Encounter for antineoplastic chemotherapy: Secondary | ICD-10-CM

## 2015-04-30 DIAGNOSIS — K219 Gastro-esophageal reflux disease without esophagitis: Secondary | ICD-10-CM

## 2015-04-30 LAB — COMPREHENSIVE METABOLIC PANEL (CC13)
ALBUMIN: 3.5 g/dL (ref 3.5–5.0)
ALT: 12 U/L (ref 0–55)
ANION GAP: 12 meq/L — AB (ref 3–11)
AST: 11 U/L (ref 5–34)
Alkaline Phosphatase: 121 U/L (ref 40–150)
BUN: 11.7 mg/dL (ref 7.0–26.0)
CALCIUM: 9.3 mg/dL (ref 8.4–10.4)
CHLORIDE: 103 meq/L (ref 98–109)
CO2: 23 meq/L (ref 22–29)
CREATININE: 0.8 mg/dL (ref 0.6–1.1)
EGFR: 87 mL/min/{1.73_m2} — ABNORMAL LOW (ref >90)
Glucose: 226 mg/dl — ABNORMAL HIGH (ref 70–140)
POTASSIUM: 3.7 meq/L (ref 3.5–5.1)
Sodium: 137 mEq/L (ref 136–145)
Total Bilirubin: 0.32 mg/dL (ref 0.20–1.20)
Total Protein: 7 g/dL (ref 6.4–8.3)

## 2015-04-30 LAB — CBC WITH DIFFERENTIAL/PLATELET
BASO%: 0.2 % (ref 0.0–2.0)
Basophils Absolute: 0 10*3/uL (ref 0.0–0.1)
EOS%: 0 % (ref 0.0–7.0)
Eosinophils Absolute: 0 10*3/uL (ref 0.0–0.5)
HCT: 33.7 % — ABNORMAL LOW (ref 34.8–46.6)
HGB: 11.1 g/dL — ABNORMAL LOW (ref 11.6–15.9)
LYMPH%: 8.7 % — AB (ref 14.0–49.7)
MCH: 26.9 pg (ref 25.1–34.0)
MCHC: 32.9 g/dL (ref 31.5–36.0)
MCV: 81.8 fL (ref 79.5–101.0)
MONO#: 0.3 10*3/uL (ref 0.1–0.9)
MONO%: 3.1 % (ref 0.0–14.0)
NEUT#: 8.4 10*3/uL — ABNORMAL HIGH (ref 1.5–6.5)
NEUT%: 88 % — AB (ref 38.4–76.8)
PLATELETS: 240 10*3/uL (ref 145–400)
RBC: 4.12 10*6/uL (ref 3.70–5.45)
RDW: 17 % — ABNORMAL HIGH (ref 11.2–14.5)
WBC: 9.5 10*3/uL (ref 3.9–10.3)
lymph#: 0.8 10*3/uL — ABNORMAL LOW (ref 0.9–3.3)

## 2015-04-30 MED ORDER — SODIUM CHLORIDE 0.9 % IV SOLN
Freq: Once | INTRAVENOUS | Status: AC
  Administered 2015-04-30: 15:00:00 via INTRAVENOUS
  Filled 2015-04-30: qty 1

## 2015-04-30 MED ORDER — SODIUM CHLORIDE 0.9 % IJ SOLN
10.0000 mL | INTRAMUSCULAR | Status: DC | PRN
  Administered 2015-04-30: 10 mL
  Filled 2015-04-30: qty 10

## 2015-04-30 MED ORDER — PROMETHAZINE HCL 25 MG PO TABS
25.0000 mg | ORAL_TABLET | Freq: Four times a day (QID) | ORAL | Status: DC | PRN
Start: 2015-04-30 — End: 2015-11-19

## 2015-04-30 MED ORDER — PALONOSETRON HCL INJECTION 0.25 MG/5ML
0.2500 mg | Freq: Once | INTRAVENOUS | Status: AC
  Administered 2015-04-30: 0.25 mg via INTRAVENOUS

## 2015-04-30 MED ORDER — SODIUM CHLORIDE 0.9 % IV SOLN
2.0000 mg | Freq: Once | INTRAVENOUS | Status: AC
  Administered 2015-04-30: 2 mg via INTRAVENOUS
  Filled 2015-04-30: qty 4

## 2015-04-30 MED ORDER — ALTEPLASE 2 MG IJ SOLR
2.0000 mg | Freq: Once | INTRAMUSCULAR | Status: DC | PRN
  Filled 2015-04-30: qty 2

## 2015-04-30 MED ORDER — HEPARIN SOD (PORK) LOCK FLUSH 100 UNIT/ML IV SOLN
500.0000 [IU] | Freq: Once | INTRAVENOUS | Status: AC | PRN
  Administered 2015-04-30: 500 [IU]
  Filled 2015-04-30: qty 5

## 2015-04-30 MED ORDER — SODIUM CHLORIDE 0.9 % IV SOLN
250.0000 mL | Freq: Once | INTRAVENOUS | Status: AC
  Administered 2015-04-30: 250 mL via INTRAVENOUS

## 2015-04-30 MED ORDER — PALONOSETRON HCL INJECTION 0.25 MG/5ML
INTRAVENOUS | Status: AC
  Filled 2015-04-30: qty 5

## 2015-04-30 NOTE — Patient Instructions (Signed)
[facility] Discharge Instructions for Patients Receiving Chemotherapy  Today you received the following chemotherapy agents Halaven.  To help prevent nausea and vomiting after your treatment, we encourage you to take your nausea medication.   If you develop nausea and vomiting that is not controlled by your nausea medication, call the clinic.   BELOW ARE SYMPTOMS THAT SHOULD BE REPORTED IMMEDIATELY:  *FEVER GREATER THAN 100.5 F  *CHILLS WITH OR WITHOUT FEVER  NAUSEA AND VOMITING THAT IS NOT CONTROLLED WITH YOUR NAUSEA MEDICATION  *UNUSUAL SHORTNESS OF BREATH  *UNUSUAL BRUISING OR BLEEDING  TENDERNESS IN MOUTH AND THROAT WITH OR WITHOUT PRESENCE OF ULCERS  *URINARY PROBLEMS  *BOWEL PROBLEMS  UNUSUAL RASH Items with * indicate a potential emergency and should be followed up as soon as possible.  Feel free to call the clinic you have any questions or concerns. The clinic phone number is [phone].  Please show the CHEMO ALERT CARD at check-in to the Emergency Department and triage nurse.   

## 2015-04-30 NOTE — Progress Notes (Signed)
Patient Care Team: Tresa Garter, MD as PCP - General (Internal Medicine)  DIAGNOSIS: No matching staging information was found for the patient.  SUMMARY OF ONCOLOGIC HISTORY:   Breast cancer, left breast   04/06/2010 - 10/26/2010 Anti-estrogen oral therapy Neoadjuvant antiestrogen therapy   11/10/2010 Surgery Left breast lumpectomy: Invasive ductal carcinoma 2 cm, grade 3, angiolymphatic invasion present, 6 lymph nodes negative, ER 99%, PR 90%, Ki-67 20%, HER-2 negative ratio 1.22   12/01/2010 - 01/11/2011 Radiation Therapy Adjuvant radiation therapy   02/03/2011 -  Anti-estrogen oral therapy Femara 2.5 mg daily   02/22/2015 Imaging CT chest revealed 6 cm mediastinal mass, right hilar lymph nodes, base of neck lesions in both right and left, subtle liver metastases, periportal lymph nodes   02/28/2015 PET scan Bulky bilateral hypermetabolic supraclavicular and mediastinal lymphadenopathy consistent with eoplasm/metastatic disease. Three hepatic metastatic lesions   03/04/2015 Initial Biopsy Mediastinal mass Biopsy: Invasive Mammary cancer ER Neg   03/19/2015 -  Chemotherapy Halaven day 1 and day 8 every 3 weeks    CHIEF COMPLIANT: Cycle 3 Halaven  INTERVAL HISTORY: Michele Hale is a 69 year old with above-mentioned history of metastatic breast cancer currently on palliative chemotherapy with Halaven. Today start cycle 3 of treatment. She has tolerated first 2 cycles fairly well apart from blood count changes which required a dose reduction. She denies any nausea or vomiting. The bone pain related to her bone metastases is improved. Intermittent nausea and vomiting, blister on the finger, headaches from Zofran.  REVIEW OF SYSTEMS:   Constitutional: Denies fevers, chills or abnormal weight loss Eyes: Denies blurriness of vision Ears, nose, mouth, throat, and face: Denies mucositis or sore throat Respiratory: Denies cough, dyspnea or wheezes Cardiovascular: Denies palpitation, chest discomfort  or lower extremity swelling Gastrointestinal:  Intermittent nausea and vomiting Skin: Denies abnormal skin rashes Lymphatics: Denies new lymphadenopathy or easy bruising Neurological: Back pain Behavioral/Psych: Mood is stable, no new changes   All other systems were reviewed with the patient and are negative.  I have reviewed the past medical history, past surgical history, social history and family history with the patient and they are unchanged from previous note.  ALLERGIES:  has No Known Allergies.  MEDICATIONS:  Current Outpatient Prescriptions  Medication Sig Dispense Refill  . acetaminophen (TYLENOL) 500 MG tablet Take 1,000 mg by mouth every 6 (six) hours as needed. For arthritis pain    . calcium-vitamin D (OSCAL WITH D) 500-200 MG-UNIT per tablet Take 1 tablet by mouth 2 (two) times daily.    . cholecalciferol (VITAMIN D) 1000 UNITS tablet Take 1 tablet (1,000 Units total) by mouth daily. 30 tablet 12  . dexamethasone (DECADRON) 2 MG tablet Take 1 tablet (2 mg total) by mouth daily. 30 tablet 2  . HYDROcodone-acetaminophen (NORCO/VICODIN) 5-325 MG per tablet Take 1-2 tablets by mouth every 4 (four) hours as needed. (Patient taking differently: Take 1-2 tablets by mouth every 4 (four) hours as needed for moderate pain. ) 10 tablet 0  . ibuprofen (ADVIL,MOTRIN) 800 MG tablet Take 1 tablet (800 mg total) by mouth every 8 (eight) hours as needed. 30 tablet 3  . lidocaine-prilocaine (EMLA) cream Apply to port-a-cath one and a half hours before use.  Cover cream with plastic wrap to seal. 30 g 3  . loratadine (CLARITIN) 10 MG tablet Take 1 tablet (10 mg total) by mouth daily. 30 tablet 3  . ondansetron (ZOFRAN) 8 MG tablet Take 1 tablet (8 mg total) by mouth 2 (two) times daily.  Start the day after chemo for 2 days. Then take as needed for nausea or vomiting. 30 tablet 1  . PRESCRIPTION MEDICATION Supportive therapy [facility]    . promethazine (PHENERGAN) 25 MG tablet Take 1 tablet (25 mg  total) by mouth every 6 (six) hours as needed. For nausea 60 tablet 0  . promethazine (PHENERGAN) 25 MG tablet Take 1 tablet (25 mg total) by mouth every 6 (six) hours as needed for nausea or vomiting. 30 tablet 6   No current facility-administered medications for this visit.    PHYSICAL EXAMINATION: ECOG PERFORMANCE STATUS: 1 - Symptomatic but completely ambulatory  Filed Vitals:   04/30/15 1334  BP: 130/78  Pulse: 98  Temp: 98.7 F (37.1 C)  Resp: 18   Filed Weights   04/30/15 1334  Weight: 142 lb (64.411 kg)    GENERAL:alert, no distress and comfortable SKIN: skin color, texture, turgor are normal, no rashes or significant lesions EYES: normal, Conjunctiva are pink and non-injected, sclera clear OROPHARYNX:no exudate, no erythema and lips, buccal mucosa, and tongue normal  NECK: supple, thyroid normal size, non-tender, without nodularity LYMPH:  no palpable lymphadenopathy in the cervical, axillary or inguinal LUNGS: clear to auscultation and percussion with normal breathing effort HEART: regular rate & rhythm and no murmurs and no lower extremity edema ABDOMEN:abdomen soft, non-tender and normal bowel sounds Musculoskeletal:no cyanosis of digits and no clubbing  NEURO: alert & oriented x 3 with fluent speech, no focal motor/sensory deficits   LABORATORY DATA:  I have reviewed the data as listed   Chemistry      Component Value Date/Time   NA 137 04/30/2015 1314   NA 141 02/21/2015 1530   K 3.7 04/30/2015 1314   K 4.0 02/21/2015 1530   CL 102 02/21/2015 1530   CL 104 01/23/2013 1420   CO2 23 04/30/2015 1314   CO2 24 02/21/2015 1530   BUN 11.7 04/30/2015 1314   BUN 13 02/21/2015 1530   CREATININE 0.8 04/30/2015 1314   CREATININE 0.54 02/21/2015 1530      Component Value Date/Time   CALCIUM 9.3 04/30/2015 1314   CALCIUM 9.5 02/21/2015 1530   ALKPHOS 121 04/30/2015 1314   ALKPHOS 63 02/07/2012 1312   AST 11 04/30/2015 1314   AST 17 02/07/2012 1312   ALT 12  04/30/2015 1314   ALT 15 02/07/2012 1312   BILITOT 0.32 04/30/2015 1314   BILITOT 0.3 02/07/2012 1312       Lab Results  Component Value Date   WBC 9.5 04/30/2015   HGB 11.1* 04/30/2015   HCT 33.7* 04/30/2015   MCV 81.8 04/30/2015   PLT 240 04/30/2015   NEUTROABS 8.4* 04/30/2015   ASSESSMENT & PLAN:  Breast cancer, left breast Left breast cancer treated with neoadjuvant antiestrogen therapy followed by lumpectomy 11/10/2010 followed by radiation completed 01/11/2011, antiestrogen therapy with Femara 2.5 mg daily started 02/03/2011, diagnosed with metastatic disease May 2016 PET/CT 03/04/15: Bulky metastatic disease in supraclavicular and Mediastinal LN and 3 hepatic lesions.  Biopsy of the mediastinal LN: Metastatic Breast cancer ER 0%, PR 0%, Ki 67 60%, Her 2 Neg  Treatment plan: Goal: Palliation; Chemotherapy with Halaven days 1, 8 Q 3 weeks  Current treatment: Halaven started 03/19/15; Today is cycle 3 day 1. (There has been a variation in her treatment schedule because of dose delay after cycle 1 day 8. This has now been corrected.)  Chemo Toxicities: 1. Neutropenia: required dose reduction to 1.2 mg/m after cycle 1 and patient was  given neulasta after day 8 of chemo 2. Decreased appetite and taste: I prescribed her low-dose dexamethasone to help boost her appetite. 3. Right and left neck pain: On Ibuprofen and tylenol 4. Intermittent chest pains radiating to the back: I suspect this may be related to acid reflux. I instructed her to take Pepto-Bismol to see if it gives her relief.  4. Normocytic to mild microcytic anemia hemoglobin 10.3 being observed, MCV 79.6 5. Blister on the finger 6. Occasional mouth sores treated with Magic mouthwash 7. Nausea and vomiting: Zofran causing headaches. Will prescribe Phenergan instead.  Patient reports that her neck and shoulder pains have much improved and she is not in too much discomfort. She does have acid reflux like  symptoms.  Scans will be done after this cycle Monitoring closely for chemotherapy toxicities.   Orders Placed This Encounter  Procedures  . CT Abdomen Pelvis W Contrast    Standing Status: Future     Number of Occurrences:      Standing Expiration Date: 04/29/2016    Order Specific Question:  Reason for Exam (SYMPTOM  OR DIAGNOSIS REQUIRED)    Answer:  Metastatic breast cancer restaging on chemo    Order Specific Question:  Preferred imaging location?    Answer:  [facility]  . CT Chest W Contrast    Standing Status: Future     Number of Occurrences:      Standing Expiration Date: 04/29/2016    Order Specific Question:  Reason for Exam (SYMPTOM  OR DIAGNOSIS REQUIRED)    Answer:  Metastatic breast cancer restaging on chemo    Order Specific Question:  Preferred imaging location?    Answer:  [facility]    Standing Status: Future     Number of Occurrences:      Standing Expiration Date: 04/29/2016    Order Specific Question:  Reason for Exam (SYMPTOM  OR DIAGNOSIS REQUIRED)    Answer:  Metastatic breast cancer restaging on chemo    Order Specific Question:  Preferred imaging location?    Answer:  [facility]   The patient has a good understanding of the overall plan. she agrees with it. she will call with any problems that may develop before the next visit here.   Rulon Eisenmenger, MD

## 2015-04-30 NOTE — Telephone Encounter (Addendum)
Gave avs & calendar for July. Patient would like water based contrast

## 2015-05-07 ENCOUNTER — Ambulatory Visit (HOSPITAL_BASED_OUTPATIENT_CLINIC_OR_DEPARTMENT_OTHER): Payer: No Typology Code available for payment source

## 2015-05-07 ENCOUNTER — Other Ambulatory Visit (HOSPITAL_BASED_OUTPATIENT_CLINIC_OR_DEPARTMENT_OTHER): Payer: No Typology Code available for payment source

## 2015-05-07 VITALS — BP 124/56 | HR 80 | Temp 98.7°F | Resp 16

## 2015-05-07 DIAGNOSIS — C778 Secondary and unspecified malignant neoplasm of lymph nodes of multiple regions: Secondary | ICD-10-CM

## 2015-05-07 DIAGNOSIS — Z5189 Encounter for other specified aftercare: Secondary | ICD-10-CM

## 2015-05-07 DIAGNOSIS — C787 Secondary malignant neoplasm of liver and intrahepatic bile duct: Secondary | ICD-10-CM

## 2015-05-07 DIAGNOSIS — C50912 Malignant neoplasm of unspecified site of left female breast: Secondary | ICD-10-CM

## 2015-05-07 DIAGNOSIS — Z5111 Encounter for antineoplastic chemotherapy: Secondary | ICD-10-CM

## 2015-05-07 LAB — COMPREHENSIVE METABOLIC PANEL (CC13)
ALBUMIN: 3.5 g/dL (ref 3.5–5.0)
ALT: 20 U/L (ref 0–55)
AST: 13 U/L (ref 5–34)
Alkaline Phosphatase: 82 U/L (ref 40–150)
Anion Gap: 7 mEq/L (ref 3–11)
BILIRUBIN TOTAL: 0.43 mg/dL (ref 0.20–1.20)
BUN: 9 mg/dL (ref 7.0–26.0)
CO2: 26 mEq/L (ref 22–29)
CREATININE: 0.7 mg/dL (ref 0.6–1.1)
Calcium: 9.3 mg/dL (ref 8.4–10.4)
Chloride: 104 mEq/L (ref 98–109)
GLUCOSE: 144 mg/dL — AB (ref 70–140)
Potassium: 4 mEq/L (ref 3.5–5.1)
Sodium: 138 mEq/L (ref 136–145)
TOTAL PROTEIN: 6.7 g/dL (ref 6.4–8.3)

## 2015-05-07 LAB — CBC WITH DIFFERENTIAL/PLATELET
BASO%: 1.1 % (ref 0.0–2.0)
BASOS ABS: 0.1 10*3/uL (ref 0.0–0.1)
EOS ABS: 0 10*3/uL (ref 0.0–0.5)
EOS%: 0.1 % (ref 0.0–7.0)
HCT: 31.4 % — ABNORMAL LOW (ref 34.8–46.6)
HGB: 10.4 g/dL — ABNORMAL LOW (ref 11.6–15.9)
LYMPH%: 9.1 % — AB (ref 14.0–49.7)
MCH: 26.8 pg (ref 25.1–34.0)
MCHC: 33.2 g/dL (ref 31.5–36.0)
MCV: 80.8 fL (ref 79.5–101.0)
MONO#: 0.2 10*3/uL (ref 0.1–0.9)
MONO%: 3.5 % (ref 0.0–14.0)
NEUT#: 4.2 10*3/uL (ref 1.5–6.5)
NEUT%: 86.2 % — AB (ref 38.4–76.8)
PLATELETS: 318 10*3/uL (ref 145–400)
RBC: 3.88 10*6/uL (ref 3.70–5.45)
RDW: 16.3 % — AB (ref 11.2–14.5)
WBC: 4.9 10*3/uL (ref 3.9–10.3)
lymph#: 0.4 10*3/uL — ABNORMAL LOW (ref 0.9–3.3)

## 2015-05-07 MED ORDER — PEGFILGRASTIM 6 MG/0.6ML ~~LOC~~ PSKT
6.0000 mg | PREFILLED_SYRINGE | Freq: Once | SUBCUTANEOUS | Status: AC
  Administered 2015-05-07: 6 mg via SUBCUTANEOUS
  Filled 2015-05-07: qty 0.6

## 2015-05-07 MED ORDER — PALONOSETRON HCL INJECTION 0.25 MG/5ML
0.2500 mg | Freq: Once | INTRAVENOUS | Status: AC
  Administered 2015-05-07: 0.25 mg via INTRAVENOUS

## 2015-05-07 MED ORDER — PALONOSETRON HCL INJECTION 0.25 MG/5ML
INTRAVENOUS | Status: AC
  Filled 2015-05-07: qty 5

## 2015-05-07 MED ORDER — HEPARIN SOD (PORK) LOCK FLUSH 100 UNIT/ML IV SOLN
500.0000 [IU] | Freq: Once | INTRAVENOUS | Status: AC | PRN
  Administered 2015-05-07: 500 [IU]
  Filled 2015-05-07: qty 5

## 2015-05-07 MED ORDER — SODIUM CHLORIDE 0.9 % IV SOLN
Freq: Once | INTRAVENOUS | Status: AC
  Administered 2015-05-07: 12:00:00 via INTRAVENOUS
  Filled 2015-05-07: qty 1

## 2015-05-07 MED ORDER — SODIUM CHLORIDE 0.9 % IV SOLN
Freq: Once | INTRAVENOUS | Status: AC
  Administered 2015-05-07: 11:00:00 via INTRAVENOUS

## 2015-05-07 MED ORDER — SODIUM CHLORIDE 0.9 % IV SOLN
1.1500 mg/m2 | Freq: Once | INTRAVENOUS | Status: AC
  Administered 2015-05-07: 2 mg via INTRAVENOUS
  Filled 2015-05-07: qty 4

## 2015-05-07 MED ORDER — SODIUM CHLORIDE 0.9 % IJ SOLN
10.0000 mL | INTRAMUSCULAR | Status: DC | PRN
  Administered 2015-05-07: 10 mL
  Filled 2015-05-07: qty 10

## 2015-05-14 ENCOUNTER — Encounter (HOSPITAL_COMMUNITY)
Admission: RE | Admit: 2015-05-14 | Discharge: 2015-05-14 | Disposition: A | Discharge status: Home / Self Care | Payer: Self-pay | Source: Ambulatory Visit | Attending: Hematology and Oncology | Admitting: Hematology and Oncology

## 2015-05-14 ENCOUNTER — Ambulatory Visit (HOSPITAL_COMMUNITY)
Admission: RE | Admit: 2015-05-14 | Discharge: 2015-05-14 | Disposition: A | Discharge status: Home / Self Care | Payer: No Typology Code available for payment source | Source: Ambulatory Visit | Attending: Hematology and Oncology | Admitting: Hematology and Oncology

## 2015-05-14 ENCOUNTER — Encounter (HOSPITAL_COMMUNITY)
Admission: RE | Admit: 2015-05-14 | Discharge: 2015-05-14 | Disposition: A | Discharge status: Home / Self Care | Payer: No Typology Code available for payment source | Source: Ambulatory Visit | Attending: Hematology and Oncology | Admitting: Hematology and Oncology

## 2015-05-14 ENCOUNTER — Encounter (HOSPITAL_COMMUNITY): Payer: Self-pay

## 2015-05-14 DIAGNOSIS — Z79899 Other long term (current) drug therapy: Secondary | ICD-10-CM | POA: Insufficient documentation

## 2015-05-14 DIAGNOSIS — C787 Secondary malignant neoplasm of liver and intrahepatic bile duct: Secondary | ICD-10-CM | POA: Insufficient documentation

## 2015-05-14 DIAGNOSIS — R591 Generalized enlarged lymph nodes: Secondary | ICD-10-CM | POA: Insufficient documentation

## 2015-05-14 DIAGNOSIS — C50912 Malignant neoplasm of unspecified site of left female breast: Secondary | ICD-10-CM | POA: Insufficient documentation

## 2015-05-14 MED ORDER — IOHEXOL 300 MG/ML  SOLN
50.0000 mL | Freq: Once | INTRAMUSCULAR | Status: AC | PRN
  Administered 2015-05-14: 50 mL via ORAL

## 2015-05-14 MED ORDER — TECHNETIUM TC 99M MEDRONATE IV KIT
25.7000 | PACK | Freq: Once | INTRAVENOUS | Status: AC | PRN
Start: 2015-05-14 — End: 2015-05-14
  Administered 2015-05-14: 25.7 via INTRAVENOUS

## 2015-05-14 MED ORDER — IOHEXOL 300 MG/ML  SOLN
100.0000 mL | Freq: Once | INTRAMUSCULAR | Status: AC | PRN
  Administered 2015-05-14: 100 mL via INTRAVENOUS

## 2015-05-20 ENCOUNTER — Other Ambulatory Visit: Payer: Self-pay | Admitting: Hematology and Oncology

## 2015-05-21 ENCOUNTER — Encounter: Payer: Self-pay | Admitting: Hematology and Oncology

## 2015-05-21 ENCOUNTER — Other Ambulatory Visit (HOSPITAL_BASED_OUTPATIENT_CLINIC_OR_DEPARTMENT_OTHER): Payer: No Typology Code available for payment source

## 2015-05-21 ENCOUNTER — Ambulatory Visit (HOSPITAL_BASED_OUTPATIENT_CLINIC_OR_DEPARTMENT_OTHER): Payer: No Typology Code available for payment source | Admitting: Hematology and Oncology

## 2015-05-21 ENCOUNTER — Ambulatory Visit (HOSPITAL_BASED_OUTPATIENT_CLINIC_OR_DEPARTMENT_OTHER): Payer: No Typology Code available for payment source

## 2015-05-21 ENCOUNTER — Telehealth: Payer: Self-pay | Admitting: Hematology and Oncology

## 2015-05-21 VITALS — BP 123/71 | HR 109 | Temp 98.7°F | Resp 18 | Ht 65.0 in | Wt 143.7 lb

## 2015-05-21 DIAGNOSIS — K219 Gastro-esophageal reflux disease without esophagitis: Secondary | ICD-10-CM

## 2015-05-21 DIAGNOSIS — Z5111 Encounter for antineoplastic chemotherapy: Secondary | ICD-10-CM

## 2015-05-21 DIAGNOSIS — M810 Age-related osteoporosis without current pathological fracture: Secondary | ICD-10-CM

## 2015-05-21 DIAGNOSIS — C50912 Malignant neoplasm of unspecified site of left female breast: Secondary | ICD-10-CM

## 2015-05-21 DIAGNOSIS — M542 Cervicalgia: Secondary | ICD-10-CM

## 2015-05-21 DIAGNOSIS — C787 Secondary malignant neoplasm of liver and intrahepatic bile duct: Secondary | ICD-10-CM

## 2015-05-21 DIAGNOSIS — C778 Secondary and unspecified malignant neoplasm of lymph nodes of multiple regions: Secondary | ICD-10-CM

## 2015-05-21 LAB — COMPREHENSIVE METABOLIC PANEL (CC13)
ALK PHOS: 98 U/L (ref 40–150)
ALT: 9 U/L (ref 0–55)
AST: 13 U/L (ref 5–34)
Albumin: 3.1 g/dL — ABNORMAL LOW (ref 3.5–5.0)
Anion Gap: 9 mEq/L (ref 3–11)
BUN: 8 mg/dL (ref 7.0–26.0)
CHLORIDE: 105 meq/L (ref 98–109)
CO2: 23 mEq/L (ref 22–29)
Calcium: 9 mg/dL (ref 8.4–10.4)
Creatinine: 0.7 mg/dL (ref 0.6–1.1)
GLUCOSE: 169 mg/dL — AB (ref 70–140)
Potassium: 3.9 mEq/L (ref 3.5–5.1)
Sodium: 138 mEq/L (ref 136–145)
Total Bilirubin: 0.31 mg/dL (ref 0.20–1.20)
Total Protein: 6.4 g/dL (ref 6.4–8.3)

## 2015-05-21 LAB — CBC WITH DIFFERENTIAL/PLATELET
BASO%: 0.9 % (ref 0.0–2.0)
Basophils Absolute: 0 10*3/uL (ref 0.0–0.1)
EOS ABS: 0 10*3/uL (ref 0.0–0.5)
EOS%: 0 % (ref 0.0–7.0)
HCT: 32.3 % — ABNORMAL LOW (ref 34.8–46.6)
HEMOGLOBIN: 10.6 g/dL — AB (ref 11.6–15.9)
LYMPH#: 1.1 10*3/uL (ref 0.9–3.3)
LYMPH%: 24.5 % (ref 14.0–49.7)
MCH: 27 pg (ref 25.1–34.0)
MCHC: 32.9 g/dL (ref 31.5–36.0)
MCV: 82.2 fL (ref 79.5–101.0)
MONO#: 0.4 10*3/uL (ref 0.1–0.9)
MONO%: 9.4 % (ref 0.0–14.0)
NEUT#: 3.1 10*3/uL (ref 1.5–6.5)
NEUT%: 65.2 % (ref 38.4–76.8)
Platelets: 224 10*3/uL (ref 145–400)
RBC: 3.92 10*6/uL (ref 3.70–5.45)
RDW: 18.6 % — ABNORMAL HIGH (ref 11.2–14.5)
WBC: 4.7 10*3/uL (ref 3.9–10.3)

## 2015-05-21 MED ORDER — PALONOSETRON HCL INJECTION 0.25 MG/5ML
INTRAVENOUS | Status: AC
  Filled 2015-05-21: qty 5

## 2015-05-21 MED ORDER — SODIUM CHLORIDE 0.9 % IV SOLN
1.1500 mg/m2 | Freq: Once | INTRAVENOUS | Status: AC
  Administered 2015-05-21: 2 mg via INTRAVENOUS
  Filled 2015-05-21: qty 4

## 2015-05-21 MED ORDER — HEPARIN SOD (PORK) LOCK FLUSH 100 UNIT/ML IV SOLN
500.0000 [IU] | Freq: Once | INTRAVENOUS | Status: AC | PRN
  Administered 2015-05-21: 500 [IU]
  Filled 2015-05-21: qty 5

## 2015-05-21 MED ORDER — PALONOSETRON HCL INJECTION 0.25 MG/5ML
0.2500 mg | Freq: Once | INTRAVENOUS | Status: AC
Start: 2015-05-21 — End: 2015-05-21
  Administered 2015-05-21: 0.25 mg via INTRAVENOUS

## 2015-05-21 MED ORDER — DENOSUMAB 60 MG/ML ~~LOC~~ SOLN
60.0000 mg | Freq: Once | SUBCUTANEOUS | Status: AC
Start: 2015-05-21 — End: 2015-05-21
  Administered 2015-05-21: 60 mg via SUBCUTANEOUS
  Filled 2015-05-21: qty 1

## 2015-05-21 MED ORDER — SODIUM CHLORIDE 0.9 % IV SOLN
Freq: Once | INTRAVENOUS | Status: AC
  Administered 2015-05-21: 16:00:00 via INTRAVENOUS
  Filled 2015-05-21: qty 1

## 2015-05-21 MED ORDER — SODIUM CHLORIDE 0.9 % IJ SOLN
10.0000 mL | INTRAMUSCULAR | Status: DC | PRN
Start: 2015-05-21 — End: 2015-05-21
  Administered 2015-05-21: 10 mL
  Filled 2015-05-21: qty 10

## 2015-05-21 MED ORDER — SODIUM CHLORIDE 0.9 % IV SOLN
Freq: Once | INTRAVENOUS | Status: AC
  Administered 2015-05-21: 16:00:00 via INTRAVENOUS

## 2015-05-21 NOTE — Telephone Encounter (Signed)
Appointments made and avs will be printed in chemo  °

## 2015-05-21 NOTE — Addendum Note (Signed)
Addended by: Prentiss Bells on: 05/21/2015 03:18 PM   Modules accepted: Medications

## 2015-05-21 NOTE — Progress Notes (Signed)
Patient Care Team: Tresa Garter, MD as PCP - General (Internal Medicine)  DIAGNOSIS: No matching staging information was found for the patient.  SUMMARY OF ONCOLOGIC HISTORY:   Breast cancer, left breast   04/06/2010 - 10/26/2010 Anti-estrogen oral therapy Neoadjuvant antiestrogen therapy   11/10/2010 Surgery Left breast lumpectomy: Invasive ductal carcinoma 2 cm, grade 3, angiolymphatic invasion present, 6 lymph nodes negative, ER 99%, PR 90%, Ki-67 20%, HER-2 negative ratio 1.22   12/01/2010 - 01/11/2011 Radiation Therapy Adjuvant radiation therapy   02/03/2011 -  Anti-estrogen oral therapy Femara 2.5 mg daily   02/22/2015 Imaging CT chest revealed 6 cm mediastinal mass, right hilar lymph nodes, base of neck lesions in both right and left, subtle liver metastases, periportal lymph nodes   02/28/2015 PET scan Bulky bilateral hypermetabolic supraclavicular and mediastinal lymphadenopathy consistent with eoplasm/metastatic disease. Three hepatic metastatic lesions   03/04/2015 Initial Biopsy Mediastinal mass Biopsy: Invasive Mammary cancer ER Neg   03/19/2015 -  Chemotherapy Halaven day 1 and day 8 Hale 3 weeks    CHIEF COMPLIANT: Patient is here to discuss the scans and also for cycle 4 chemotherapy  INTERVAL HISTORY: Michele Hale is a 69 year old with above-mentioned history of metastatic breast cancer currently on palliative chemotherapy with Halaven. She appears to be tolerating the chemotherapy fairly well except for neuropathy that is of the fingertips. She had recent CT scan of the chest abdomen pelvis and bone scan and is here today to discuss the results of those tests. She has had diffuse bony aches and pains probably musculoskeletal in nature.  REVIEW OF SYSTEMS:   Constitutional: Denies fevers, chills or abnormal weight loss Eyes: Denies blurriness of vision Ears, nose, mouth, throat, and face: Denies mucositis or sore throat Respiratory: Denies cough, dyspnea or  wheezes Cardiovascular: Denies palpitation, chest discomfort , patient complains of lower extremity swelling Gastrointestinal:  Denies nausea, heartburn or change in bowel habits Skin: Denies abnormal skin rashes Lymphatics: Denies new lymphadenopathy or easy bruising Neurological: Neuropathy in the fingertips Behavioral/Psych: Mood is stable, no new changes   All other systems were reviewed with the patient and are negative.  I have reviewed the past medical history, past surgical history, social history and family history with the patient and they are unchanged from previous note.  ALLERGIES:  has No Known Allergies.  MEDICATIONS:  Current Outpatient Prescriptions  Medication Sig Dispense Refill  . acetaminophen (TYLENOL) 500 MG tablet Take 1,000 mg by mouth Hale 6 (six) hours as needed. For arthritis pain    . calcium-vitamin D (OSCAL WITH D) 500-200 MG-UNIT per tablet Take 1 tablet by mouth 2 (two) times daily.    . cholecalciferol (VITAMIN D) 1000 UNITS tablet Take 1 tablet (1,000 Units total) by mouth daily. 30 tablet 12  . dexamethasone (DECADRON) 2 MG tablet Take 1 tablet (2 mg total) by mouth daily. 30 tablet 2  . HYDROcodone-acetaminophen (NORCO/VICODIN) 5-325 MG per tablet Take 1-2 tablets by mouth Hale 4 (four) hours as needed. (Patient taking differently: Take 1-2 tablets by mouth Hale 4 (four) hours as needed for moderate pain. ) 10 tablet 0  . ibuprofen (ADVIL,MOTRIN) 800 MG tablet Take 1 tablet (800 mg total) by mouth Hale 8 (eight) hours as needed. 30 tablet 3  . lidocaine-prilocaine (EMLA) cream Apply to port-a-cath one and a half hours before use.  Cover cream with plastic wrap to seal. 30 g 3  . loratadine (CLARITIN) 10 MG tablet Take 1 tablet (10 mg total) by mouth daily. [place]  tablet 3  . ondansetron (ZOFRAN) 8 MG tablet Take 1 tablet (8 mg total) by mouth 2 (two) times daily. Start the day after chemo for 2 days. Then take as needed for nausea or vomiting. 30 tablet 1   . PRESCRIPTION MEDICATION Supportive therapy [facility]    . promethazine (PHENERGAN) 25 MG tablet Take 1 tablet (25 mg total) by mouth Hale 6 (six) hours as needed. For nausea 60 tablet 0  . promethazine (PHENERGAN) 25 MG tablet Take 1 tablet (25 mg total) by mouth Hale 6 (six) hours as needed for nausea or vomiting. 30 tablet 6   No current facility-administered medications for this visit.    PHYSICAL EXAMINATION: ECOG PERFORMANCE STATUS: 2 - Symptomatic, <50% confined to bed  Filed Vitals:   05/21/15 1451  BP: 123/71  Pulse: 109  Temp: 98.7 F (37.1 C)  Resp: 18   Filed Weights   05/21/15 1451  Weight: 143 lb 11.2 oz (65.182 kg)    GENERAL:alert, no distress and comfortable SKIN: skin color, texture, turgor are normal, no rashes or significant lesions EYES: normal, Conjunctiva are pink and non-injected, sclera clear OROPHARYNX:no exudate, no erythema and lips, buccal mucosa, and tongue normal  NECK: supple, thyroid normal size, non-tender, without nodularity LYMPH:  no palpable lymphadenopathy in the cervical, axillary or inguinal LUNGS: clear to auscultation and percussion with normal breathing effort HEART: regular rate & rhythm and no murmurs and 1+ lower extremity edema ABDOMEN:abdomen soft, non-tender and normal bowel sounds Musculoskeletal:no cyanosis of digits and no clubbing  NEURO: alert & oriented x 3 with fluent speech, grade 1-2 neuropathy  LABORATORY DATA:  I have reviewed the data as listed   Chemistry      Component Value Date/Time   NA 138 05/07/2015 1032   NA 141 02/21/2015 1530   K 4.0 05/07/2015 1032   K 4.0 02/21/2015 1530   CL 102 02/21/2015 1530   CL 104 01/23/2013 1420   CO2 26 05/07/2015 1032   CO2 24 02/21/2015 1530   BUN 9.0 05/07/2015 1032   BUN 13 02/21/2015 1530   CREATININE 0.7 05/07/2015 1032   CREATININE 0.54 02/21/2015 1530      Component Value Date/Time   CALCIUM 9.3 05/07/2015 1032   CALCIUM 9.5 02/21/2015 1530   ALKPHOS 82  05/07/2015 1032   ALKPHOS 63 02/07/2012 1312   AST 13 05/07/2015 1032   AST 17 02/07/2012 1312   ALT 20 05/07/2015 1032   ALT 15 02/07/2012 1312   BILITOT 0.43 05/07/2015 1032   BILITOT 0.3 02/07/2012 1312       Lab Results  Component Value Date   WBC 4.7 05/21/2015   HGB 10.6* 05/21/2015   HCT 32.3* 05/21/2015   MCV 82.2 05/21/2015   PLT 224 05/21/2015   NEUTROABS 3.1 05/21/2015   ASSESSMENT & PLAN:  Breast cancer, left breast Left breast cancer treated with neoadjuvant antiestrogen therapy followed by lumpectomy 11/10/2010 followed by radiation completed 01/11/2011, antiestrogen therapy with Femara 2.5 mg daily started 02/03/2011, diagnosed with metastatic disease May 2016 PET/CT 03/04/15: Bulky metastatic disease in supraclavicular and Mediastinal LN and 3 hepatic lesions.  Biopsy of the mediastinal LN: Metastatic Breast cancer ER 0%, PR 0%, Ki 67 60%, Her 2 Neg  Treatment plan: Goal: Palliation; Chemotherapy with Halaven days 1, 8 Q 3 weeks  Current treatment: Halaven started 03/19/15; Today is cycle 4 day 1. (There has been a variation in her treatment schedule because of dose delay after cycle 1 day 8.  This has now been corrected.)  Chemo Toxicities: 1. Neutropenia: required dose reduction to 1.2 mg/m after cycle 1 and patient was given neulasta after day 8 of chemo 2. Decreased appetite and taste: I prescribed her low-dose dexamethasone to help boost her appetite. 3. Right and left neck pain: On Ibuprofen and tylenol 4. Intermittent chest pains radiating to the back: I suspect this may be related to acid reflux. I instructed her to take Pepto-Bismol to see if it gives her relief.  4. Normocytic to mild microcytic anemia hemoglobin 10.3 being observed, MCV 79.6 5. Blister on the finger 6. Occasional mouth sores treated with Magic mouthwash 7. Nausea and vomiting: Zofran causing headaches. We prescribed Phenergan instead. 8. Neuropathy of the feet: Grade 1 we will be  watching it closely.  Radiology review: CT chest abdomen pelvis and bone scan done on 05/14/2015 showed marked improvement in the large mediastinal lymph node previously 6.1 cm now at 4.7 x 2.6 cm no other residual enlarged mediastinal, hilar, axillary or supraclavicular lymph nodes. (Previously these measured 2.1, 1.1 cm, right base of neck 1.8 sinus, left base of neck 1.7 cm, right hilar 2.4 sinus, esophageal lymph node 8 mm); previously seen liver metastases 1 cm in size is no longer visible focal increased uptake in the left side of the sternum.  The above results suggest that she is having excellent response to chemotherapy. I recommended continuation of 3 additional cycles of chemotherapy followed by PET/CT scan.  Return to clinic in 3 weeks for follow-up No orders of the defined types were placed in this encounter.   The patient has a good understanding of the overall plan. she agrees with it. she will call with any problems that may develop before the next visit here.   Rulon Eisenmenger, MD

## 2015-05-21 NOTE — Assessment & Plan Note (Addendum)
Left breast cancer treated with neoadjuvant antiestrogen therapy followed by lumpectomy 11/10/2010 followed by radiation completed 01/11/2011, antiestrogen therapy with Femara 2.5 mg daily started 02/03/2011, diagnosed with metastatic disease May 2016 PET/CT 03/04/15: Bulky metastatic disease in supraclavicular and Mediastinal LN and 3 hepatic lesions.  Biopsy of the mediastinal LN: Metastatic Breast cancer ER 0%, PR 0%, Ki 67 60%, Her 2 Neg  Treatment plan: Goal: Palliation; Chemotherapy with Halaven days 1, 8 Q 3 weeks  Current treatment: Halaven started 03/19/15; Today is cycle 4 day 1. (There has been a variation in her treatment schedule because of dose delay after cycle 1 day 8. This has now been corrected.)  Chemo Toxicities: 1. Neutropenia: required dose reduction to 1.2 mg/m after cycle 1 and patient was given neulasta after day 8 of chemo 2. Decreased appetite and taste: I prescribed her low-dose dexamethasone to help boost her appetite. 3. Right and left neck pain: On Ibuprofen and tylenol 4. Intermittent chest pains radiating to the back: I suspect this may be related to acid reflux. I instructed her to take Pepto-Bismol to see if it gives her relief.  4. Normocytic to mild microcytic anemia hemoglobin 10.3 being observed, MCV 79.6 5. Blister on the finger 6. Occasional mouth sores treated with Magic mouthwash 7. Nausea and vomiting: Zofran causing headaches. Will prescribe Phenergan instead.  Patient reports that her neck and shoulder pains have much improved and she is not in too much discomfort. She does have acid reflux like symptoms.  Radiology review: CT chest abdomen pelvis and bone scan done on 05/14/2015 showed marked improvement in the large mediastinal lymph node previously 6.1 cm now at 4.7 x 2.6 cm no other residual enlarged mediastinal, hilar, axillary or supraclavicular lymph nodes. (Previously these measured 2.1, 1.1 cm, right base of neck 1.8 sinus, left base of  neck 1.7 cm, right hilar 2.4 sinus, esophageal lymph node 8 mm); previously seen liver metastases 1 cm in size is no longer visible focal increased uptake in the left side of the sternum.  The above results suggest that she is having excellent response to chemotherapy. I recommended continuation of 3 additional cycles of chemotherapy followed by PET/CT scan.

## 2015-05-21 NOTE — Patient Instructions (Addendum)
[facility] Discharge Instructions for Patients Receiving Chemotherapy  Today you received the following chemotherapy agents Halaven.  To help prevent nausea and vomiting after your treatment, we encourage you to take your nausea medication as directed.    If you develop nausea and vomiting that is not controlled by your nausea medication, call the clinic.   BELOW ARE SYMPTOMS THAT SHOULD BE REPORTED IMMEDIATELY:  *FEVER GREATER THAN 100.5 F  *CHILLS WITH OR WITHOUT FEVER  NAUSEA AND VOMITING THAT IS NOT CONTROLLED WITH YOUR NAUSEA MEDICATION  *UNUSUAL SHORTNESS OF BREATH  *UNUSUAL BRUISING OR BLEEDING  TENDERNESS IN MOUTH AND THROAT WITH OR WITHOUT PRESENCE OF ULCERS  *URINARY PROBLEMS  *BOWEL PROBLEMS  UNUSUAL RASH Items with * indicate a potential emergency and should be followed up as soon as possible.  Feel free to call the clinic you have any questions or concerns. The clinic phone number is [phone].  Please show the [place] at check-in to the Emergency Department and triage nurse.  Denosumab injection What is this medicine? DENOSUMAB (den oh sue mab) slows bone breakdown. Prolia is used to treat osteoporosis in women after menopause and in men. Delton See is used to prevent bone fractures and other bone problems caused by cancer bone metastases. Delton See is also used to treat giant cell tumor of the bone. This medicine may be used for other purposes; ask your health care provider or pharmacist if you have questions. COMMON BRAND NAME(S): Prolia, XGEVA What should I tell my health care provider before I take this medicine? They need to know if you have any of these conditions: -dental disease -eczema -infection or history of infections -kidney disease or on dialysis -low blood calcium or vitamin D -malabsorption syndrome -scheduled to have surgery or tooth extraction -taking medicine that contains denosumab -thyroid or parathyroid  disease -an unusual reaction to denosumab, other medicines, foods, dyes, or preservatives -pregnant or trying to get pregnant -breast-feeding How should I use this medicine? This medicine is for injection under the skin. It is given by a health care professional in a hospital or clinic setting. If you are getting Prolia, a special MedGuide will be given to you by the pharmacist with each prescription and refill. Be sure to read this information carefully each time. For Prolia, talk to your pediatrician regarding the use of this medicine in children. Special care may be needed. For Delton See, talk to your pediatrician regarding the use of this medicine in children. While this drug may be prescribed for children as young as 13 years for selected conditions, precautions do apply. Overdosage: If you think you've taken too much of this medicine contact a poison control center or emergency room at once. Overdosage: If you think you have taken too much of this medicine contact a poison control center or emergency room at once. NOTE: This medicine is only for you. Do not share this medicine with others. What if I miss a dose? It is important not to miss your dose. Call your doctor or health care professional if you are unable to keep an appointment. What may interact with this medicine? Do not take this medicine with any of the following medications: -other medicines containing denosumab This medicine may also interact with the following medications: -medicines that suppress the immune system -medicines that treat cancer -steroid medicines like prednisone or cortisone This list may not describe all possible interactions. Give your health care provider a list of all the medicines, herbs, non-prescription drugs,  or dietary supplements you use. Also tell them if you smoke, drink alcohol, or use illegal drugs. Some items may interact with your medicine. What should I watch for while using this medicine? Visit  your doctor or health care professional for regular checks on your progress. Your doctor or health care professional may order blood tests and other tests to see how you are doing. Call your doctor or health care professional if you get a cold or other infection while receiving this medicine. Do not treat yourself. This medicine may decrease your body's ability to fight infection. You should make sure you get enough calcium and vitamin D while you are taking this medicine, unless your doctor tells you not to. Discuss the foods you eat and the vitamins you take with your health care professional. See your dentist regularly. Brush and floss your teeth as directed. Before you have any dental work done, tell your dentist you are receiving this medicine. Do not become pregnant while taking this medicine or for 5 months after stopping it. Women should inform their doctor if they wish to become pregnant or think they might be pregnant. There is a potential for serious side effects to an unborn child. Talk to your health care professional or pharmacist for more information. What side effects may I notice from receiving this medicine? Side effects that you should report to your doctor or health care professional as soon as possible: -allergic reactions like skin rash, itching or hives, swelling of the face, lips, or tongue -breathing problems -chest pain -fast, irregular heartbeat -feeling faint or lightheaded, falls -fever, chills, or any other sign of infection -muscle spasms, tightening, or twitches -numbness or tingling -skin blisters or bumps, or is dry, peels, or red -slow healing or unexplained pain in the mouth or jaw -unusual bleeding or bruising Side effects that usually do not require medical attention (Report these to your doctor or health care professional if they continue or are bothersome.): -muscle pain -stomach upset, gas This list may not describe all possible side effects. Call your  doctor for medical advice about side effects. You may report side effects to FDA at 1-800-FDA-1088. Where should I keep my medicine? This medicine is only given in a clinic, doctor's office, or other health care setting and will not be stored at home. NOTE: This sheet is a summary. It may not cover all possible information. If you have questions about this medicine, talk to your doctor, pharmacist, or health care provider.  2015, [employer]vier/Gold Standard. (2012-04-10 12:37:47)

## 2015-05-28 ENCOUNTER — Other Ambulatory Visit (HOSPITAL_BASED_OUTPATIENT_CLINIC_OR_DEPARTMENT_OTHER): Payer: No Typology Code available for payment source

## 2015-05-28 ENCOUNTER — Ambulatory Visit: Payer: No Typology Code available for payment source

## 2015-05-28 ENCOUNTER — Other Ambulatory Visit: Payer: Self-pay | Admitting: Hematology and Oncology

## 2015-05-28 DIAGNOSIS — C50912 Malignant neoplasm of unspecified site of left female breast: Secondary | ICD-10-CM

## 2015-05-28 LAB — CBC WITH DIFFERENTIAL/PLATELET
BASO%: 1.3 % (ref 0.0–2.0)
Basophils Absolute: 0 10*3/uL (ref 0.0–0.1)
EOS ABS: 0 10*3/uL (ref 0.0–0.5)
EOS%: 0.2 % (ref 0.0–7.0)
HCT: 32 % — ABNORMAL LOW (ref 34.8–46.6)
HGB: 10.4 g/dL — ABNORMAL LOW (ref 11.6–15.9)
LYMPH%: 57.5 % — ABNORMAL HIGH (ref 14.0–49.7)
MCH: 27 pg (ref 25.1–34.0)
MCHC: 32.6 g/dL (ref 31.5–36.0)
MCV: 82.8 fL (ref 79.5–101.0)
MONO#: 0.2 10*3/uL (ref 0.1–0.9)
MONO%: 9 % (ref 0.0–14.0)
NEUT%: 32 % — AB (ref 38.4–76.8)
NEUTROS ABS: 0.8 10*3/uL — AB (ref 1.5–6.5)
PLATELETS: 294 10*3/uL (ref 145–400)
RBC: 3.86 10*6/uL (ref 3.70–5.45)
RDW: 18.2 % — AB (ref 11.2–14.5)
WBC: 2.6 10*3/uL — ABNORMAL LOW (ref 3.9–10.3)
lymph#: 1.5 10*3/uL (ref 0.9–3.3)

## 2015-05-28 LAB — COMPREHENSIVE METABOLIC PANEL (CC13)
ALT: 19 U/L (ref 0–55)
AST: 18 U/L (ref 5–34)
Albumin: 3.4 g/dL — ABNORMAL LOW (ref 3.5–5.0)
Alkaline Phosphatase: 78 U/L (ref 40–150)
Anion Gap: 9 mEq/L (ref 3–11)
BUN: 3.3 mg/dL — AB (ref 7.0–26.0)
CO2: 27 mEq/L (ref 22–29)
Calcium: 8.9 mg/dL (ref 8.4–10.4)
Chloride: 104 mEq/L (ref 98–109)
Creatinine: 0.6 mg/dL (ref 0.6–1.1)
GLUCOSE: 112 mg/dL (ref 70–140)
Potassium: 3.2 mEq/L — ABNORMAL LOW (ref 3.5–5.1)
SODIUM: 140 meq/L (ref 136–145)
Total Bilirubin: 0.43 mg/dL (ref 0.20–1.20)
Total Protein: 6.7 g/dL (ref 6.4–8.3)

## 2015-05-28 NOTE — Progress Notes (Signed)
ANC 0.8. No treatment today per Dr. Lindi Adie. Spoke with patient and patient's son in lobby.

## 2015-05-30 ENCOUNTER — Telehealth: Payer: Self-pay | Admitting: *Deleted

## 2015-05-30 NOTE — Telephone Encounter (Signed)
Patient's daughter called to report that her mother was having numbness in her fingers/feet. Stated that it feels like "a sheet over her feet". Advised patient that I would let Dr. Lindi Adie know and we would contact them if he wanted her to come in and be seen. She verbalized understanding.

## 2015-06-03 ENCOUNTER — Telehealth: Payer: Self-pay | Admitting: Hematology and Oncology

## 2015-06-03 ENCOUNTER — Other Ambulatory Visit: Payer: Self-pay

## 2015-06-03 NOTE — Telephone Encounter (Signed)
Left message to confirm appointment for 08/11 per pof

## 2015-06-05 ENCOUNTER — Encounter: Payer: Self-pay | Admitting: Hematology and Oncology

## 2015-06-05 ENCOUNTER — Telehealth: Payer: Self-pay | Admitting: Hematology and Oncology

## 2015-06-05 ENCOUNTER — Ambulatory Visit (HOSPITAL_BASED_OUTPATIENT_CLINIC_OR_DEPARTMENT_OTHER): Payer: No Typology Code available for payment source | Admitting: Hematology and Oncology

## 2015-06-05 DIAGNOSIS — C787 Secondary malignant neoplasm of liver and intrahepatic bile duct: Secondary | ICD-10-CM

## 2015-06-05 DIAGNOSIS — C50912 Malignant neoplasm of unspecified site of left female breast: Secondary | ICD-10-CM

## 2015-06-05 DIAGNOSIS — G620 Drug-induced polyneuropathy: Secondary | ICD-10-CM

## 2015-06-05 DIAGNOSIS — C778 Secondary and unspecified malignant neoplasm of lymph nodes of multiple regions: Secondary | ICD-10-CM

## 2015-06-05 MED ORDER — MAGNESIUM OXIDE 400 (241.3 MG) MG PO TABS: 400.0000 mg | ORAL_TABLET | Freq: Every day | ORAL | Status: DC

## 2015-06-05 NOTE — Progress Notes (Signed)
Patient Care Team: Tresa Garter, MD as PCP - General (Internal Medicine)  DIAGNOSIS: No matching staging information was found for the patient.  SUMMARY OF ONCOLOGIC HISTORY:   Breast cancer, left breast   04/06/2010 - 10/26/2010 Anti-estrogen oral therapy Neoadjuvant antiestrogen therapy   11/10/2010 Surgery Left breast lumpectomy: Invasive ductal carcinoma 2 cm, grade 3, angiolymphatic invasion present, 6 lymph nodes negative, ER 99%, PR 90%, Ki-67 20%, HER-2 negative ratio 1.22   12/01/2010 - 01/11/2011 Radiation Therapy Adjuvant radiation therapy   02/03/2011 -  Anti-estrogen oral therapy Femara 2.5 mg daily   02/22/2015 Imaging CT chest revealed 6 cm mediastinal mass, right hilar lymph nodes, base of neck lesions in both right and left, subtle liver metastases, periportal lymph nodes   02/28/2015 PET scan Bulky bilateral hypermetabolic supraclavicular and mediastinal lymphadenopathy consistent with eoplasm/metastatic disease. Three hepatic metastatic lesions   03/04/2015 Initial Biopsy Mediastinal mass Biopsy: Invasive Mammary cancer ER Neg   03/19/2015 -  Chemotherapy Halaven day 1 and day 8 every 3 weeks    CHIEF COMPLIANT: Patient is here for neuropathy evaluation  INTERVAL HISTORY: Michele Hale is a 69 year old with above-mentioned history of metastatic breast cancer currently on palliative chemotherapy with Halaven. She has complaining of neuropathy to start a week ago. It has improved significantly today. It is mostly on the bottom of the feet. She has some difficulty with walking. Denies any nausea vomiting or fevers or chills.  REVIEW OF SYSTEMS:   Constitutional: Denies fevers, chills or abnormal weight loss Eyes: Denies blurriness of vision Ears, nose, mouth, throat, and face: Denies mucositis or sore throat Respiratory: Denies cough, dyspnea or wheezes Cardiovascular: Denies palpitation, chest discomfort or lower extremity swelling Gastrointestinal:  Denies nausea,  heartburn or change in bowel habits Skin: Denies abnormal skin rashes Lymphatics: Denies new lymphadenopathy or easy bruising Neurological: Tingling numbness of her feet Behavioral/Psych: Mood is stable, no new changes   All other systems were reviewed with the patient and are negative.  I have reviewed the past medical history, past surgical history, social history and family history with the patient and they are unchanged from previous note.  ALLERGIES:  has No Known Allergies.  MEDICATIONS:  Current Outpatient Prescriptions  Medication Sig Dispense Refill  . acetaminophen (TYLENOL) 500 MG tablet Take 1,000 mg by mouth every 6 (six) hours as needed. For arthritis pain    . calcium-vitamin D (OSCAL WITH D) 500-200 MG-UNIT per tablet Take 1 tablet by mouth 2 (two) times daily.    . cholecalciferol (VITAMIN D) 1000 UNITS tablet Take 1 tablet (1,000 Units total) by mouth daily. 30 tablet 12  . dexamethasone (DECADRON) 2 MG tablet Take 1 tablet (2 mg total) by mouth daily. 30 tablet 2  . HYDROcodone-acetaminophen (NORCO/VICODIN) 5-325 MG per tablet Take 1-2 tablets by mouth every 4 (four) hours as needed. (Patient taking differently: Take 1-2 tablets by mouth every 4 (four) hours as needed for moderate pain. ) 10 tablet 0  . ibuprofen (ADVIL,MOTRIN) 800 MG tablet Take 1 tablet (800 mg total) by mouth every 8 (eight) hours as needed. 30 tablet 3  . lidocaine-prilocaine (EMLA) cream Apply to port-a-cath one and a half hours before use.  Cover cream with plastic wrap to seal. 30 g 3  . loratadine (CLARITIN) 10 MG tablet Take 1 tablet (10 mg total) by mouth daily. 30 tablet 3  . magnesium oxide (MAG-OX) 400 (241.3 MG) MG tablet Take 1 tablet (400 mg total) by mouth daily. 30 tablet 3  .  ondansetron (ZOFRAN) 8 MG tablet Take 1 tablet (8 mg total) by mouth 2 (two) times daily. Start the day after chemo for 2 days. Then take as needed for nausea or vomiting. 30 tablet 1  . PRESCRIPTION MEDICATION  Supportive therapy [facility]    . promethazine (PHENERGAN) 25 MG tablet Take 1 tablet (25 mg total) by mouth every 6 (six) hours as needed. For nausea 60 tablet 0  . promethazine (PHENERGAN) 25 MG tablet Take 1 tablet (25 mg total) by mouth every 6 (six) hours as needed for nausea or vomiting. 30 tablet 6   No current facility-administered medications for this visit.    PHYSICAL EXAMINATION: ECOG PERFORMANCE STATUS: 1 - Symptomatic but completely ambulatory  Filed Vitals:   06/05/15 1426  BP: 167/82  Pulse: 107  Temp: 98.3 F (36.8 C)  Resp: 18   Filed Weights   06/05/15 1426  Weight: 143 lb 1.6 oz (64.91 kg)    GENERAL:alert, no distress and comfortable SKIN: skin color, texture, turgor are normal, no rashes or significant lesions EYES: normal, Conjunctiva are pink and non-injected, sclera clear OROPHARYNX:no exudate, no erythema and lips, buccal mucosa, and tongue normal  NECK: supple, thyroid normal size, non-tender, without nodularity LYMPH:  no palpable lymphadenopathy in the cervical, axillary or inguinal LUNGS: clear to auscultation and percussion with normal breathing effort HEART: regular rate & rhythm and no murmurs and no lower extremity edema ABDOMEN:abdomen soft, non-tender and normal bowel sounds Musculoskeletal:no cyanosis of digits and no clubbing  NEURO: Neuropathy grade 1-2  LABORATORY DATA:  I have reviewed the data as listed   Chemistry      Component Value Date/Time   NA 140 05/28/2015 1350   NA 141 02/21/2015 1530   K 3.2* 05/28/2015 1350   K 4.0 02/21/2015 1530   CL 102 02/21/2015 1530   CL 104 01/23/2013 1420   CO2 27 05/28/2015 1350   CO2 24 02/21/2015 1530   BUN 3.3* 05/28/2015 1350   BUN 13 02/21/2015 1530   CREATININE 0.6 05/28/2015 1350   CREATININE 0.54 02/21/2015 1530      Component Value Date/Time   CALCIUM 8.9 05/28/2015 1350   CALCIUM 9.5 02/21/2015 1530   ALKPHOS 78 05/28/2015 1350   ALKPHOS 63 02/07/2012 1312   AST 18 05/28/2015  1350   AST 17 02/07/2012 1312   ALT 19 05/28/2015 1350   ALT 15 02/07/2012 1312   BILITOT 0.43 05/28/2015 1350   BILITOT 0.3 02/07/2012 1312       Lab Results  Component Value Date   WBC 2.6* 05/28/2015   HGB 10.4* 05/28/2015   HCT 32.0* 05/28/2015   MCV 82.8 05/28/2015   PLT 294 05/28/2015   NEUTROABS 0.8* 05/28/2015     RADIOGRAPHIC STUDIES: I have personally reviewed the radiology reports and agreed with their findings. No results found.   ASSESSMENT & PLAN:  Breast cancer, left breast Left breast cancer treated with neoadjuvant antiestrogen therapy followed by lumpectomy 11/10/2010 followed by radiation completed 01/11/2011, antiestrogen therapy with Femara 2.5 mg daily started 02/03/2011, diagnosed with metastatic disease May 2016 PET/CT 03/04/15: Bulky metastatic disease in supraclavicular and Mediastinal LN and 3 hepatic lesions.  Biopsy of the mediastinal LN: Metastatic Breast cancer ER 0%, PR 0%, Ki 67 60%, Her 2 Neg  Current treatment: Halaven started 03/19/15; cycle 4 Day 8 held for cytopenia Change in treatment plan: Because of patient is developing profound neuropathy, we have decided to change chemotherapy to day 1 every 3 weeks  of Halaven. We will completely on that day 8 of chemotherapy. We will give Neulasta with the first day of each cycle.  Neuropathy: Chemotherapy induced. I prescribed magnesium supplementation. She will also take oral B-12 over-the-counter.  Return to clinic September 9 for next cycle of chemotherapy. She will receive cycle #5 on 06/11/2015. Patient and her family understand the treatment plan and they're agreeable to proceed.    No orders of the defined types were placed in this encounter.   The patient has a good understanding of the overall plan. she agrees with it. she will call with any problems that may develop before the next visit here.   Rulon Eisenmenger, MD

## 2015-06-05 NOTE — Telephone Encounter (Signed)
8/24 cancelled per pof  anne

## 2015-06-05 NOTE — Assessment & Plan Note (Signed)
Left breast cancer treated with neoadjuvant antiestrogen therapy followed by lumpectomy 11/10/2010 followed by radiation completed 01/11/2011, antiestrogen therapy with Femara 2.5 mg daily started 02/03/2011, diagnosed with metastatic disease May 2016 PET/CT 03/04/15: Bulky metastatic disease in supraclavicular and Mediastinal LN and 3 hepatic lesions.  Biopsy of the mediastinal LN: Metastatic Breast cancer ER 0%, PR 0%, Ki 67 60%, Her 2 Neg  Current treatment: Halaven started 03/19/15; cycle 4 Day 8 held for cytopenia Change in treatment plan: Because of patient is developing profound neuropathy, we have decided to change chemotherapy to day 1 every 3 weeks of Halaven. We will completely on that day 8 of chemotherapy. We will give Neulasta with the first day of each cycle.  Neuropathy: Chemotherapy induced. I prescribed magnesium supplementation. She will also take oral B-12 over-the-counter.  Return to clinic September 9 for next cycle of chemotherapy. She will receive cycle #5 on 06/11/2015. Patient and her family understand the treatment plan and they're agreeable to proceed.

## 2015-06-11 ENCOUNTER — Other Ambulatory Visit (HOSPITAL_BASED_OUTPATIENT_CLINIC_OR_DEPARTMENT_OTHER): Payer: No Typology Code available for payment source

## 2015-06-11 ENCOUNTER — Ambulatory Visit (HOSPITAL_BASED_OUTPATIENT_CLINIC_OR_DEPARTMENT_OTHER): Payer: No Typology Code available for payment source

## 2015-06-11 ENCOUNTER — Other Ambulatory Visit: Payer: Self-pay | Admitting: *Deleted

## 2015-06-11 VITALS — BP 117/72 | HR 96 | Temp 98.7°F | Resp 18

## 2015-06-11 DIAGNOSIS — C50912 Malignant neoplasm of unspecified site of left female breast: Secondary | ICD-10-CM

## 2015-06-11 DIAGNOSIS — Z5111 Encounter for antineoplastic chemotherapy: Secondary | ICD-10-CM

## 2015-06-11 DIAGNOSIS — C778 Secondary and unspecified malignant neoplasm of lymph nodes of multiple regions: Secondary | ICD-10-CM

## 2015-06-11 DIAGNOSIS — C787 Secondary malignant neoplasm of liver and intrahepatic bile duct: Secondary | ICD-10-CM

## 2015-06-11 DIAGNOSIS — Z5189 Encounter for other specified aftercare: Secondary | ICD-10-CM

## 2015-06-11 LAB — CBC WITH DIFFERENTIAL/PLATELET
BASO%: 0.3 % (ref 0.0–2.0)
Basophils Absolute: 0 10*3/uL (ref 0.0–0.1)
EOS%: 0.3 % (ref 0.0–7.0)
Eosinophils Absolute: 0 10*3/uL (ref 0.0–0.5)
HCT: 36.8 % (ref 34.8–46.6)
HGB: 12 g/dL (ref 11.6–15.9)
LYMPH%: 34 % (ref 14.0–49.7)
MCH: 27 pg (ref 25.1–34.0)
MCHC: 32.6 g/dL (ref 31.5–36.0)
MCV: 82.9 fL (ref 79.5–101.0)
MONO#: 0.4 10*3/uL (ref 0.1–0.9)
MONO%: 10.7 % (ref 0.0–14.0)
NEUT#: 1.8 10*3/uL (ref 1.5–6.5)
NEUT%: 54.7 % (ref 38.4–76.8)
Platelets: 300 10*3/uL (ref 145–400)
RBC: 4.44 10*6/uL (ref 3.70–5.45)
RDW: 15.8 % — ABNORMAL HIGH (ref 11.2–14.5)
WBC: 3.3 10*3/uL — ABNORMAL LOW (ref 3.9–10.3)
lymph#: 1.1 10*3/uL (ref 0.9–3.3)

## 2015-06-11 LAB — COMPREHENSIVE METABOLIC PANEL (CC13)
ALT: 11 U/L (ref 0–55)
AST: 15 U/L (ref 5–34)
Albumin: 3.6 g/dL (ref 3.5–5.0)
Alkaline Phosphatase: 83 U/L (ref 40–150)
Anion Gap: 9 mEq/L (ref 3–11)
BUN: 8.7 mg/dL (ref 7.0–26.0)
CHLORIDE: 106 meq/L (ref 98–109)
CO2: 24 mEq/L (ref 22–29)
CREATININE: 0.7 mg/dL (ref 0.6–1.1)
Calcium: 9.2 mg/dL (ref 8.4–10.4)
EGFR: >90 mL/min/{1.73_m2} (ref >90)
Glucose: 118 mg/dl (ref 70–140)
Potassium: 4.4 mEq/L (ref 3.5–5.1)
Sodium: 139 mEq/L (ref 136–145)
Total Bilirubin: 0.34 mg/dL (ref 0.20–1.20)
Total Protein: 7.1 g/dL (ref 6.4–8.3)

## 2015-06-11 MED ORDER — SODIUM CHLORIDE 0.9 % IJ SOLN
10.0000 mL | INTRAMUSCULAR | Status: DC | PRN
  Administered 2015-06-11: 10 mL
  Filled 2015-06-11: qty 10

## 2015-06-11 MED ORDER — ERIBULIN MESYLATE CHEMO INJECTION 1 MG/2ML
1.0000 mg/m2 | Freq: Once | INTRAVENOUS | Status: AC
  Administered 2015-06-11: 1.7 mg via INTRAVENOUS
  Filled 2015-06-11: qty 3.4

## 2015-06-11 MED ORDER — HEPARIN SOD (PORK) LOCK FLUSH 100 UNIT/ML IV SOLN
500.0000 [IU] | Freq: Once | INTRAVENOUS | Status: AC | PRN
  Administered 2015-06-11: 500 [IU]
  Filled 2015-06-11: qty 5

## 2015-06-11 MED ORDER — PEGFILGRASTIM 6 MG/0.6ML ~~LOC~~ PSKT
6.0000 mg | PREFILLED_SYRINGE | Freq: Once | SUBCUTANEOUS | Status: AC
  Administered 2015-06-11: 6 mg via SUBCUTANEOUS
  Filled 2015-06-11: qty 0.6

## 2015-06-11 MED ORDER — SODIUM CHLORIDE 0.9 % IV SOLN
Freq: Once | INTRAVENOUS | Status: AC
  Administered 2015-06-11: 16:00:00 via INTRAVENOUS
  Filled 2015-06-11: qty 1

## 2015-06-11 MED ORDER — PALONOSETRON HCL INJECTION 0.25 MG/5ML
0.2500 mg | Freq: Once | INTRAVENOUS | Status: AC
  Administered 2015-06-11: 0.25 mg via INTRAVENOUS

## 2015-06-11 MED ORDER — SODIUM CHLORIDE 0.9 % IV SOLN
Freq: Once | INTRAVENOUS | Status: AC
  Administered 2015-06-11: 16:00:00 via INTRAVENOUS

## 2015-06-11 MED ORDER — PALONOSETRON HCL INJECTION 0.25 MG/5ML
INTRAVENOUS | Status: AC
  Filled 2015-06-11: qty 5

## 2015-06-11 MED ORDER — LIDOCAINE-PRILOCAINE 2.5-2.5 % EX CREA: TOPICAL_CREAM | CUTANEOUS | Status: AC

## 2015-06-11 NOTE — Patient Instructions (Signed)
[facility] Discharge Instructions for Patients Receiving Chemotherapy  Today you received the following chemotherapy agents: Halaven  To help prevent nausea and vomiting after your treatment, we encourage you to take your nausea medication as directed.    If you develop nausea and vomiting that is not controlled by your nausea medication, call the clinic.   BELOW ARE SYMPTOMS THAT SHOULD BE REPORTED IMMEDIATELY:  *FEVER GREATER THAN 100.5 F  *CHILLS WITH OR WITHOUT FEVER  NAUSEA AND VOMITING THAT IS NOT CONTROLLED WITH YOUR NAUSEA MEDICATION  *UNUSUAL SHORTNESS OF BREATH  *UNUSUAL BRUISING OR BLEEDING  TENDERNESS IN MOUTH AND THROAT WITH OR WITHOUT PRESENCE OF ULCERS  *URINARY PROBLEMS  *BOWEL PROBLEMS  UNUSUAL RASH Items with * indicate a potential emergency and should be followed up as soon as possible.  Feel free to call the clinic you have any questions or concerns. The clinic phone number is [phone].  Please show the CHEMO ALERT CARD at check-in to the Emergency Department and triage nurse.   

## 2015-06-18 ENCOUNTER — Ambulatory Visit: Payer: No Typology Code available for payment source

## 2015-06-18 ENCOUNTER — Ambulatory Visit: Payer: No Typology Code available for payment source | Admitting: Hematology and Oncology

## 2015-06-18 ENCOUNTER — Other Ambulatory Visit: Payer: No Typology Code available for payment source

## 2015-07-02 ENCOUNTER — Other Ambulatory Visit (HOSPITAL_BASED_OUTPATIENT_CLINIC_OR_DEPARTMENT_OTHER): Payer: No Typology Code available for payment source

## 2015-07-02 ENCOUNTER — Ambulatory Visit (HOSPITAL_BASED_OUTPATIENT_CLINIC_OR_DEPARTMENT_OTHER): Payer: No Typology Code available for payment source

## 2015-07-02 VITALS — BP 108/66 | HR 95 | Temp 97.4°F

## 2015-07-02 DIAGNOSIS — Z5111 Encounter for antineoplastic chemotherapy: Secondary | ICD-10-CM

## 2015-07-02 DIAGNOSIS — C787 Secondary malignant neoplasm of liver and intrahepatic bile duct: Secondary | ICD-10-CM

## 2015-07-02 DIAGNOSIS — Z5189 Encounter for other specified aftercare: Secondary | ICD-10-CM

## 2015-07-02 DIAGNOSIS — C50912 Malignant neoplasm of unspecified site of left female breast: Secondary | ICD-10-CM

## 2015-07-02 DIAGNOSIS — C778 Secondary and unspecified malignant neoplasm of lymph nodes of multiple regions: Secondary | ICD-10-CM

## 2015-07-02 LAB — CBC WITH DIFFERENTIAL/PLATELET
BASO%: 0.9 % (ref 0.0–2.0)
Basophils Absolute: 0 10*3/uL (ref 0.0–0.1)
EOS%: 0.6 % (ref 0.0–7.0)
Eosinophils Absolute: 0 10*3/uL (ref 0.0–0.5)
HCT: 34.8 % (ref 34.8–46.6)
HGB: 11.5 g/dL — ABNORMAL LOW (ref 11.6–15.9)
LYMPH%: 46.7 % (ref 14.0–49.7)
MCH: 27.5 pg (ref 25.1–34.0)
MCHC: 33 g/dL (ref 31.5–36.0)
MCV: 83.3 fL (ref 79.5–101.0)
MONO#: 0.4 10*3/uL (ref 0.1–0.9)
MONO%: 11.7 % (ref 0.0–14.0)
NEUT#: 1.4 10*3/uL — ABNORMAL LOW (ref 1.5–6.5)
NEUT%: 40.1 % (ref 38.4–76.8)
Platelets: 325 10*3/uL (ref 145–400)
RBC: 4.18 10*6/uL (ref 3.70–5.45)
RDW: 15.3 % — ABNORMAL HIGH (ref 11.2–14.5)
WBC: 3.5 10*3/uL — ABNORMAL LOW (ref 3.9–10.3)
lymph#: 1.6 10*3/uL (ref 0.9–3.3)

## 2015-07-02 LAB — COMPREHENSIVE METABOLIC PANEL (CC13)
ALT: 10 U/L (ref 0–55)
ANION GAP: 8 meq/L (ref 3–11)
AST: 13 U/L (ref 5–34)
Albumin: 3.9 g/dL (ref 3.5–5.0)
Alkaline Phosphatase: 71 U/L (ref 40–150)
BUN: 7.4 mg/dL (ref 7.0–26.0)
CHLORIDE: 101 meq/L (ref 98–109)
CO2: 29 meq/L (ref 22–29)
CREATININE: 0.7 mg/dL (ref 0.6–1.1)
Calcium: 9.8 mg/dL (ref 8.4–10.4)
EGFR: >90 mL/min/{1.73_m2} (ref >90)
Glucose: 90 mg/dl (ref 70–140)
POTASSIUM: 4.1 meq/L (ref 3.5–5.1)
Sodium: 138 mEq/L (ref 136–145)
Total Bilirubin: 0.48 mg/dL (ref 0.20–1.20)
Total Protein: 7.2 g/dL (ref 6.4–8.3)

## 2015-07-02 MED ORDER — SODIUM CHLORIDE 0.9 % IV SOLN
Freq: Once | INTRAVENOUS | Status: AC
  Administered 2015-07-02: 16:00:00 via INTRAVENOUS
  Filled 2015-07-02: qty 1

## 2015-07-02 MED ORDER — SODIUM CHLORIDE 0.9 % IJ SOLN
10.0000 mL | INTRAMUSCULAR | Status: DC | PRN
  Administered 2015-07-02: 10 mL
  Filled 2015-07-02: qty 10

## 2015-07-02 MED ORDER — PALONOSETRON HCL INJECTION 0.25 MG/5ML
INTRAVENOUS | Status: AC
  Filled 2015-07-02: qty 5

## 2015-07-02 MED ORDER — SODIUM CHLORIDE 0.9 % IV SOLN
1.0000 mg/m2 | Freq: Once | INTRAVENOUS | Status: AC
  Administered 2015-07-02: 1.7 mg via INTRAVENOUS
  Filled 2015-07-02: qty 3.4

## 2015-07-02 MED ORDER — SODIUM CHLORIDE 0.9 % IV SOLN
Freq: Once | INTRAVENOUS | Status: AC
  Administered 2015-07-02: 16:00:00 via INTRAVENOUS

## 2015-07-02 MED ORDER — HEPARIN SOD (PORK) LOCK FLUSH 100 UNIT/ML IV SOLN
500.0000 [IU] | Freq: Once | INTRAVENOUS | Status: AC | PRN
  Administered 2015-07-02: 500 [IU]
  Filled 2015-07-02: qty 5

## 2015-07-02 MED ORDER — PALONOSETRON HCL INJECTION 0.25 MG/5ML
0.2500 mg | Freq: Once | INTRAVENOUS | Status: AC
Start: 2015-07-02 — End: 2015-07-02
  Administered 2015-07-02: 0.25 mg via INTRAVENOUS

## 2015-07-02 MED ORDER — PEGFILGRASTIM 6 MG/0.6ML ~~LOC~~ PSKT
6.0000 mg | PREFILLED_SYRINGE | Freq: Once | SUBCUTANEOUS | Status: AC
  Administered 2015-07-02: 6 mg via SUBCUTANEOUS
  Filled 2015-07-02: qty 0.6

## 2015-07-02 NOTE — Patient Instructions (Signed)
[facility] Discharge Instructions for Patients Receiving Chemotherapy  Today you received the following chemotherapy agents:  Halaven  To help prevent nausea and vomiting after your treatment, we encourage you to take your nausea medication as ordered per MD.   If you develop nausea and vomiting that is not controlled by your nausea medication, call the clinic.   BELOW ARE SYMPTOMS THAT SHOULD BE REPORTED IMMEDIATELY:  *FEVER GREATER THAN 100.5 F  *CHILLS WITH OR WITHOUT FEVER  NAUSEA AND VOMITING THAT IS NOT CONTROLLED WITH YOUR NAUSEA MEDICATION  *UNUSUAL SHORTNESS OF BREATH  *UNUSUAL BRUISING OR BLEEDING  TENDERNESS IN MOUTH AND THROAT WITH OR WITHOUT PRESENCE OF ULCERS  *URINARY PROBLEMS  *BOWEL PROBLEMS  UNUSUAL RASH Items with * indicate a potential emergency and should be followed up as soon as possible.  Feel free to call the clinic you have any questions or concerns. The clinic phone number is [phone].  Please show the CHEMO ALERT CARD at check-in to the Emergency Department and triage nurse.   

## 2015-07-02 NOTE — Progress Notes (Signed)
OK to treat with ANC 1.4 per Dr. Gudena. 

## 2015-07-03 ENCOUNTER — Other Ambulatory Visit: Payer: Self-pay

## 2015-07-03 DIAGNOSIS — C50912 Malignant neoplasm of unspecified site of left female breast: Secondary | ICD-10-CM

## 2015-07-04 ENCOUNTER — Telehealth: Payer: Self-pay | Admitting: Hematology and Oncology

## 2015-07-04 NOTE — Telephone Encounter (Signed)
s.w. pt son and advised on Sept appt....ok anda ware

## 2015-07-18 ENCOUNTER — Ambulatory Visit (HOSPITAL_COMMUNITY)
Admission: RE | Admit: 2015-07-18 | Discharge: 2015-07-18 | Disposition: A | Discharge status: Home / Self Care | Payer: No Typology Code available for payment source | Source: Ambulatory Visit | Attending: Hematology and Oncology | Admitting: Hematology and Oncology

## 2015-07-18 DIAGNOSIS — C50912 Malignant neoplasm of unspecified site of left female breast: Secondary | ICD-10-CM | POA: Insufficient documentation

## 2015-07-18 DIAGNOSIS — M47896 Other spondylosis, lumbar region: Secondary | ICD-10-CM | POA: Insufficient documentation

## 2015-07-18 DIAGNOSIS — I700 Atherosclerosis of aorta: Secondary | ICD-10-CM | POA: Insufficient documentation

## 2015-07-18 LAB — GLUCOSE, CAPILLARY: Glucose-Capillary: 91 mg/dL (ref 65–99)

## 2015-07-18 MED ORDER — FLUDEOXYGLUCOSE F - 18 (FDG) INJECTION
6.8000 | Freq: Once | INTRAVENOUS | Status: DC | PRN
  Administered 2015-07-18: 6.8 via INTRAVENOUS
  Filled 2015-07-18: qty 6.8

## 2015-07-20 NOTE — Assessment & Plan Note (Signed)
Left breast cancer treated with neoadjuvant antiestrogen therapy followed by lumpectomy 11/10/2010 followed by radiation completed 01/11/2011, antiestrogen therapy with Femara 2.5 mg daily started 02/03/2011, diagnosed with metastatic disease May 2016 PET/CT 03/04/15: Bulky metastatic disease in supraclavicular and Mediastinal LN and 3 hepatic lesions.  Biopsy of the mediastinal LN: Metastatic Breast cancer ER 0%, PR 0%, Ki 67 60%, Her 2 Neg  Current treatment: Halaven started 03/19/15; completed 6 cycles 07/02/15 Change in treatment plan: Because of patient is developing profound neuropathy, we have decided to change chemotherapy to day 1 every 3 weeks of Halaven. We will give Neulasta with the first day of each cycle.  Neuropathy: Chemotherapy induced. I prescribed magnesium supplementation. She will also take oral B-12 over-the-counter.  PET-CT: 07/18/15: Continued response to therapy. Residual tumor in ant mediastinum decreased in size.  RTC q 3 weeks for chemo Halaven maintenance

## 2015-07-21 ENCOUNTER — Ambulatory Visit (HOSPITAL_BASED_OUTPATIENT_CLINIC_OR_DEPARTMENT_OTHER): Payer: No Typology Code available for payment source | Admitting: Hematology and Oncology

## 2015-07-21 ENCOUNTER — Telehealth: Payer: Self-pay | Admitting: Hematology and Oncology

## 2015-07-21 ENCOUNTER — Encounter: Payer: Self-pay | Admitting: Hematology and Oncology

## 2015-07-21 VITALS — BP 147/70 | HR 80 | Temp 98.7°F | Resp 18 | Ht 65.0 in | Wt 138.5 lb

## 2015-07-21 DIAGNOSIS — C50912 Malignant neoplasm of unspecified site of left female breast: Secondary | ICD-10-CM

## 2015-07-21 MED ORDER — DEXAMETHASONE 1 MG PO TABS: 0.5000 mg | ORAL_TABLET | Freq: Every day | ORAL | Status: DC

## 2015-07-21 NOTE — Telephone Encounter (Signed)
Gave avs & calendar for September/October. °

## 2015-07-21 NOTE — Progress Notes (Signed)
Patient Care Team: Tresa Garter, MD as PCP - General (Internal Medicine)  DIAGNOSIS: No matching staging information was found for the patient.  SUMMARY OF ONCOLOGIC HISTORY:   Breast cancer, left breast   04/06/2010 - 10/26/2010 Anti-estrogen oral therapy Neoadjuvant antiestrogen therapy   11/10/2010 Surgery Left breast lumpectomy: Invasive ductal carcinoma 2 cm, grade 3, angiolymphatic invasion present, 6 lymph nodes negative, ER 99%, PR 90%, Ki-67 20%, HER-2 negative ratio 1.22   12/01/2010 - 01/11/2011 Radiation Therapy Adjuvant radiation therapy   02/03/2011 -  Anti-estrogen oral therapy Femara 2.5 mg daily   02/22/2015 Imaging CT chest revealed 6 cm mediastinal mass, right hilar lymph nodes, base of neck lesions in both right and left, subtle liver metastases, periportal lymph nodes   02/28/2015 PET scan Bulky bilateral hypermetabolic supraclavicular and mediastinal lymphadenopathy consistent with eoplasm/metastatic disease. Three hepatic metastatic lesions   03/04/2015 Initial Biopsy Mediastinal mass Biopsy: Invasive Mammary cancer ER Neg   03/19/2015 -  Chemotherapy Halaven day 1 and day 8 every 3 weeks    CHIEF COMPLIANT:  Completed 6 cycles of Halaven , follow-up of PET/CT  INTERVAL HISTORY: Michele Hale is a  68 year old with above-mentioned history metastatic breast cancer who underwent a PET CT scan after completion of 6 cycles of chemotherapy. Since we change the regimen to once every 3 weeks, she is tolerating it extremely well. Neuropathy has nearly resolved in her feet. Denies any nausea or vomiting. She has full of energy and has no complaints or concerns. In fact she wanted to know if the dosage of chemotherapy can be increased.  patient's only sadness is because she would like to go back to her home country but because of her health reasons she is remaining here.  REVIEW OF SYSTEMS:   Constitutional: Denies fevers, chills or abnormal weight loss Eyes: Denies blurriness of  vision Ears, nose, mouth, throat, and face: Denies mucositis or sore throat Respiratory: Denies cough, dyspnea or wheezes Cardiovascular: Denies palpitation, chest discomfort or lower extremity swelling Gastrointestinal:  Denies nausea, heartburn or change in bowel habits Skin: Denies abnormal skin rashes Lymphatics: Denies new lymphadenopathy or easy bruising Neurological: moderate improvement in neuropathy Behavioral/Psych: Mood is stable, no new changes  All other systems were reviewed with the patient and are negative.  I have reviewed the past medical history, past surgical history, social history and family history with the patient and they are unchanged from previous note.  ALLERGIES:  has No Known Allergies.  MEDICATIONS:  Current Outpatient Prescriptions  Medication Sig Dispense Refill  . acetaminophen (TYLENOL) 500 MG tablet Take 1,000 mg by mouth every 6 (six) hours as needed. For arthritis pain    . calcium-vitamin D (OSCAL WITH D) 500-200 MG-UNIT per tablet Take 1 tablet by mouth 2 (two) times daily.    . cholecalciferol (VITAMIN D) 1000 UNITS tablet Take 1 tablet (1,000 Units total) by mouth daily. 30 tablet 12  . dexamethasone (DECADRON) 1 MG tablet Take 0.5 tablets (0.5 mg total) by mouth daily. 30 tablet 1  . HYDROcodone-acetaminophen (NORCO/VICODIN) 5-325 MG per tablet Take 1-2 tablets by mouth every 4 (four) hours as needed. (Patient taking differently: Take 1-2 tablets by mouth every 4 (four) hours as needed for moderate pain. ) 10 tablet 0  . ibuprofen (ADVIL,MOTRIN) 800 MG tablet Take 1 tablet (800 mg total) by mouth every 8 (eight) hours as needed. 30 tablet 3  . lidocaine-prilocaine (EMLA) cream Apply to port-a-cath one and a half hours before use.  Cover  cream with plastic wrap to seal. 30 g 3  . loratadine (CLARITIN) 10 MG tablet Take 1 tablet (10 mg total) by mouth daily. 30 tablet 3  . magnesium oxide (MAG-OX) 400 (241.3 MG) MG tablet Take 1 tablet (400 mg total)  by mouth daily. 30 tablet 3  . ondansetron (ZOFRAN) 8 MG tablet Take 1 tablet (8 mg total) by mouth 2 (two) times daily. Start the day after chemo for 2 days. Then take as needed for nausea or vomiting. 30 tablet 1  . PRESCRIPTION MEDICATION Supportive therapy [facility]    . promethazine (PHENERGAN) 25 MG tablet Take 1 tablet (25 mg total) by mouth every 6 (six) hours as needed. For nausea 60 tablet 0  . promethazine (PHENERGAN) 25 MG tablet Take 1 tablet (25 mg total) by mouth every 6 (six) hours as needed for nausea or vomiting. 30 tablet 6   No current facility-administered medications for this visit.   Facility-Administered Medications Ordered in Other Visits  Medication Dose Route Frequency Sarrinah Gardin Last Rate Last Dose  . fludeoxyglucose F - 18 (FDG) injection 6.8 milli Curie  6.8 milli Curie Intravenous Once PRN Medication Radiologist, MD   6.8 milli Curie at 07/18/15 0721    PHYSICAL EXAMINATION: ECOG PERFORMANCE STATUS: 1 - Symptomatic but completely ambulatory  Filed Vitals:   07/21/15 1452  BP: 147/70  Pulse: 80  Temp: 98.7 F (37.1 C)  Resp: 18   Filed Weights   07/21/15 1452  Weight: 138 lb 8 oz (62.823 kg)    GENERAL:alert, no distress and comfortable SKIN: skin color, texture, turgor are normal, no rashes or significant lesions EYES: normal, Conjunctiva are pink and non-injected, sclera clear OROPHARYNX:no exudate, no erythema and lips, buccal mucosa, and tongue normal  NECK: supple, thyroid normal size, non-tender, without nodularity LYMPH:  no palpable lymphadenopathy in the cervical, axillary or inguinal LUNGS: clear to auscultation and percussion with normal breathing effort HEART: regular rate & rhythm and no murmurs and no lower extremity edema ABDOMEN:abdomen soft, non-tender and normal bowel sounds Musculoskeletal:no cyanosis of digits and no clubbing  NEURO: alert & oriented x 3 with fluent speech, no focal motor/sensory deficits  LABORATORY DATA:  I have  reviewed the data as listed   Chemistry      Component Value Date/Time   NA 138 07/02/2015 1509   NA 141 02/21/2015 1530   K 4.1 07/02/2015 1509   K 4.0 02/21/2015 1530   CL 102 02/21/2015 1530   CL 104 01/23/2013 1420   CO2 29 07/02/2015 1509   CO2 24 02/21/2015 1530   BUN 7.4 07/02/2015 1509   BUN 13 02/21/2015 1530   CREATININE 0.7 07/02/2015 1509   CREATININE 0.54 02/21/2015 1530      Component Value Date/Time   CALCIUM 9.8 07/02/2015 1509   CALCIUM 9.5 02/21/2015 1530   ALKPHOS 71 07/02/2015 1509   ALKPHOS 63 02/07/2012 1312   AST 13 07/02/2015 1509   AST 17 02/07/2012 1312   ALT 10 07/02/2015 1509   ALT 15 02/07/2012 1312   BILITOT 0.48 07/02/2015 1509   BILITOT 0.3 02/07/2012 1312       Lab Results  Component Value Date   WBC 3.5* 07/02/2015   HGB 11.5* 07/02/2015   HCT 34.8 07/02/2015   MCV 83.3 07/02/2015   PLT 325 07/02/2015   NEUTROABS 1.4* 07/02/2015   ASSESSMENT & PLAN:  Breast cancer, left breast Left breast cancer treated with neoadjuvant antiestrogen therapy followed by lumpectomy 11/10/2010 followed by  radiation completed 01/11/2011, antiestrogen therapy with Femara 2.5 mg daily started 02/03/2011, diagnosed with metastatic disease May 2016 PET/CT 03/04/15: Bulky metastatic disease in supraclavicular and Mediastinal LN and 3 hepatic lesions.  Biopsy of the mediastinal LN: Metastatic Breast cancer ER 0%, PR 0%, Ki 67 60%, Her 2 Neg  Current treatment: Halaven started 03/19/15; completed 6 cycles 07/02/15 , now on maintenance Halaven  Maintenance therapy: Because of patient  developed profound neuropathy, we have decided to change chemotherapy to day 1 every 3 weeks of Halaven. We will give Neulasta with the first day of each cycle.   Neuropathy: Chemotherapy induced. I prescribed magnesium supplementation. She will also take oral B-12 over-the-counter. Because of all these measures her symptoms have markedly improved.  PET-CT: 07/18/15: Continued  response to therapy. Residual tumor in ant mediastinum decreased in size.  RTC q 3 weeks for chemo Halaven maintenance Goal of treatment: palliation Patient will remain on Halaven as long as she is responding and as long as she can tolerate it.  No orders of the defined types were placed in this encounter.   The patient has a good understanding of the overall plan. she agrees with it. she will call with any problems that may develop before the next visit here.   Rulon Eisenmenger, MD

## 2015-07-22 ENCOUNTER — Other Ambulatory Visit: Payer: Self-pay | Admitting: Medical Oncology

## 2015-07-22 ENCOUNTER — Other Ambulatory Visit (HOSPITAL_BASED_OUTPATIENT_CLINIC_OR_DEPARTMENT_OTHER): Payer: No Typology Code available for payment source

## 2015-07-22 ENCOUNTER — Ambulatory Visit (HOSPITAL_BASED_OUTPATIENT_CLINIC_OR_DEPARTMENT_OTHER): Payer: No Typology Code available for payment source

## 2015-07-22 VITALS — BP 129/62 | HR 70 | Temp 98.6°F

## 2015-07-22 DIAGNOSIS — C50912 Malignant neoplasm of unspecified site of left female breast: Secondary | ICD-10-CM

## 2015-07-22 DIAGNOSIS — C787 Secondary malignant neoplasm of liver and intrahepatic bile duct: Secondary | ICD-10-CM

## 2015-07-22 DIAGNOSIS — Z5111 Encounter for antineoplastic chemotherapy: Secondary | ICD-10-CM

## 2015-07-22 DIAGNOSIS — C778 Secondary and unspecified malignant neoplasm of lymph nodes of multiple regions: Secondary | ICD-10-CM

## 2015-07-22 LAB — CBC WITH DIFFERENTIAL/PLATELET
BASO%: 0.9 % (ref 0.0–2.0)
Basophils Absolute: 0 10*3/uL (ref 0.0–0.1)
EOS%: 0.4 % (ref 0.0–7.0)
Eosinophils Absolute: 0 10*3/uL (ref 0.0–0.5)
HEMATOCRIT: 34.8 % (ref 34.8–46.6)
HGB: 11.2 g/dL — ABNORMAL LOW (ref 11.6–15.9)
LYMPH#: 1.2 10*3/uL (ref 0.9–3.3)
LYMPH%: 38.9 % (ref 14.0–49.7)
MCH: 27 pg (ref 25.1–34.0)
MCHC: 32.3 g/dL (ref 31.5–36.0)
MCV: 83.5 fL (ref 79.5–101.0)
MONO#: 0.3 10*3/uL (ref 0.1–0.9)
MONO%: 9.9 % (ref 0.0–14.0)
NEUT%: 49.9 % (ref 38.4–76.8)
NEUTROS ABS: 1.6 10*3/uL (ref 1.5–6.5)
PLATELETS: 250 10*3/uL (ref 145–400)
RBC: 4.16 10*6/uL (ref 3.70–5.45)
RDW: 16.3 % — AB (ref 11.2–14.5)
WBC: 3.2 10*3/uL — AB (ref 3.9–10.3)

## 2015-07-22 LAB — COMPREHENSIVE METABOLIC PANEL (CC13)
ALT: 22 U/L (ref 0–55)
ANION GAP: 9 meq/L (ref 3–11)
AST: 13 U/L (ref 5–34)
Albumin: 3.8 g/dL (ref 3.5–5.0)
Alkaline Phosphatase: 68 U/L (ref 40–150)
BILIRUBIN TOTAL: 0.39 mg/dL (ref 0.20–1.20)
BUN: 16.8 mg/dL (ref 7.0–26.0)
CALCIUM: 9.4 mg/dL (ref 8.4–10.4)
CO2: 25 meq/L (ref 22–29)
CREATININE: 0.7 mg/dL (ref 0.6–1.1)
Chloride: 103 mEq/L (ref 98–109)
EGFR: >90 mL/min/{1.73_m2} (ref >90)
Glucose: 167 mg/dl — ABNORMAL HIGH (ref 70–140)
Potassium: 3.9 mEq/L (ref 3.5–5.1)
Sodium: 137 mEq/L (ref 136–145)
TOTAL PROTEIN: 6.7 g/dL (ref 6.4–8.3)

## 2015-07-22 MED ORDER — SODIUM CHLORIDE 0.9 % IV SOLN
1.0000 mg/m2 | Freq: Once | INTRAVENOUS | Status: AC
  Administered 2015-07-22: 1.7 mg via INTRAVENOUS
  Filled 2015-07-22: qty 3.4

## 2015-07-22 MED ORDER — HEPARIN SOD (PORK) LOCK FLUSH 100 UNIT/ML IV SOLN
500.0000 [IU] | Freq: Once | INTRAVENOUS | Status: AC | PRN
  Filled 2015-07-22: qty 5

## 2015-07-22 MED ORDER — PALONOSETRON HCL INJECTION 0.25 MG/5ML
0.2500 mg | Freq: Once | INTRAVENOUS | Status: AC
  Administered 2015-07-22: 0.25 mg via INTRAVENOUS

## 2015-07-22 MED ORDER — PEGFILGRASTIM 6 MG/0.6ML ~~LOC~~ PSKT
6.0000 mg | PREFILLED_SYRINGE | Freq: Once | SUBCUTANEOUS | Status: AC
  Administered 2015-07-22: 6 mg via SUBCUTANEOUS
  Filled 2015-07-22: qty 0.6

## 2015-07-22 MED ORDER — SODIUM CHLORIDE 0.9 % IJ SOLN
10.0000 mL | INTRAMUSCULAR | Status: DC | PRN
  Filled 2015-07-22: qty 10

## 2015-07-22 MED ORDER — SODIUM CHLORIDE 0.9 % IV SOLN
Freq: Once | INTRAVENOUS | Status: AC
  Administered 2015-07-22: 15:00:00 via INTRAVENOUS

## 2015-07-22 MED ORDER — DEXAMETHASONE SODIUM PHOSPHATE 100 MG/10ML IJ SOLN
Freq: Once | INTRAMUSCULAR | Status: AC
  Administered 2015-07-22: 16:00:00 via INTRAVENOUS
  Filled 2015-07-22: qty 1

## 2015-07-22 MED ORDER — PALONOSETRON HCL INJECTION 0.25 MG/5ML
INTRAVENOUS | Status: AC
  Filled 2015-07-22: qty 5

## 2015-08-12 ENCOUNTER — Encounter: Payer: Self-pay | Admitting: Hematology and Oncology

## 2015-08-12 NOTE — Progress Notes (Signed)
I called and spoke with son Mr. Mohamed. He said his mom now has medicare and they will bring card in when they come 10/19. I advised him poss asst with halevan and neulasta onpro kit, so he will also have letter of support from him that he takes care of his mom-she doesn't work.

## 2015-08-13 ENCOUNTER — Encounter: Payer: Self-pay | Admitting: Hematology and Oncology

## 2015-08-13 ENCOUNTER — Telehealth: Payer: Self-pay | Admitting: Hematology and Oncology

## 2015-08-13 ENCOUNTER — Ambulatory Visit (HOSPITAL_BASED_OUTPATIENT_CLINIC_OR_DEPARTMENT_OTHER): Payer: No Typology Code available for payment source

## 2015-08-13 ENCOUNTER — Ambulatory Visit (HOSPITAL_BASED_OUTPATIENT_CLINIC_OR_DEPARTMENT_OTHER): Payer: No Typology Code available for payment source | Admitting: Hematology and Oncology

## 2015-08-13 VITALS — BP 130/64 | HR 86 | Temp 98.6°F | Resp 18 | Ht 65.0 in | Wt 142.7 lb

## 2015-08-13 DIAGNOSIS — C50512 Malignant neoplasm of lower-outer quadrant of left female breast: Secondary | ICD-10-CM

## 2015-08-13 DIAGNOSIS — C778 Secondary and unspecified malignant neoplasm of lymph nodes of multiple regions: Secondary | ICD-10-CM

## 2015-08-13 DIAGNOSIS — C787 Secondary malignant neoplasm of liver and intrahepatic bile duct: Secondary | ICD-10-CM

## 2015-08-13 DIAGNOSIS — R490 Dysphonia: Secondary | ICD-10-CM

## 2015-08-13 DIAGNOSIS — M542 Cervicalgia: Secondary | ICD-10-CM

## 2015-08-13 DIAGNOSIS — C50412 Malignant neoplasm of upper-outer quadrant of left female breast: Secondary | ICD-10-CM

## 2015-08-13 DIAGNOSIS — C50912 Malignant neoplasm of unspecified site of left female breast: Secondary | ICD-10-CM

## 2015-08-13 DIAGNOSIS — G620 Drug-induced polyneuropathy: Secondary | ICD-10-CM

## 2015-08-13 DIAGNOSIS — Z5111 Encounter for antineoplastic chemotherapy: Secondary | ICD-10-CM

## 2015-08-13 LAB — CBC WITH DIFFERENTIAL/PLATELET
BASO%: 0.5 % (ref 0.0–2.0)
Basophils Absolute: 0 10*3/uL (ref 0.0–0.1)
EOS%: 0.5 % (ref 0.0–7.0)
Eosinophils Absolute: 0 10*3/uL (ref 0.0–0.5)
HCT: 35.4 % (ref 34.8–46.6)
HGB: 11.6 g/dL (ref 11.6–15.9)
LYMPH%: 31.9 % (ref 14.0–49.7)
MCH: 27.4 pg (ref 25.1–34.0)
MCHC: 32.8 g/dL (ref 31.5–36.0)
MCV: 83.7 fL (ref 79.5–101.0)
MONO#: 0.5 10*3/uL (ref 0.1–0.9)
MONO%: 10.2 % (ref 0.0–14.0)
NEUT#: 2.5 10*3/uL (ref 1.5–6.5)
NEUT%: 56.9 % (ref 38.4–76.8)
Platelets: 229 10*3/uL (ref 145–400)
RBC: 4.23 10*6/uL (ref 3.70–5.45)
RDW: 15.9 % — ABNORMAL HIGH (ref 11.2–14.5)
WBC: 4.4 10*3/uL (ref 3.9–10.3)
lymph#: 1.4 10*3/uL (ref 0.9–3.3)

## 2015-08-13 LAB — COMPREHENSIVE METABOLIC PANEL (CC13)
ALT: 10 U/L (ref 0–55)
AST: 8 U/L (ref 5–34)
Albumin: 3.5 g/dL (ref 3.5–5.0)
Alkaline Phosphatase: 58 U/L (ref 40–150)
Anion Gap: 7 mEq/L (ref 3–11)
BUN: 12.4 mg/dL (ref 7.0–26.0)
CO2: 29 mEq/L (ref 22–29)
Calcium: 9.9 mg/dL (ref 8.4–10.4)
Chloride: 105 mEq/L (ref 98–109)
Creatinine: 0.7 mg/dL (ref 0.6–1.1)
EGFR: >90 mL/min/{1.73_m2} (ref >90)
Glucose: 104 mg/dl (ref 70–140)
Potassium: 3.6 mEq/L (ref 3.5–5.1)
Sodium: 141 mEq/L (ref 136–145)
Total Bilirubin: <0.3 mg/dL (ref 0.20–1.20)
Total Protein: 6.6 g/dL (ref 6.4–8.3)

## 2015-08-13 MED ORDER — SODIUM CHLORIDE 0.9 % IV SOLN
1.0000 mg/m2 | Freq: Once | INTRAVENOUS | Status: AC
  Administered 2015-08-13: 1.7 mg via INTRAVENOUS
  Filled 2015-08-13: qty 3.4

## 2015-08-13 MED ORDER — PEGFILGRASTIM 6 MG/0.6ML ~~LOC~~ PSKT
6.0000 mg | PREFILLED_SYRINGE | Freq: Once | SUBCUTANEOUS | Status: AC
  Administered 2015-08-13: 6 mg via SUBCUTANEOUS
  Filled 2015-08-13: qty 0.6

## 2015-08-13 MED ORDER — DEXAMETHASONE SODIUM PHOSPHATE 100 MG/10ML IJ SOLN
Freq: Once | INTRAMUSCULAR | Status: AC
  Administered 2015-08-13: 15:00:00 via INTRAVENOUS
  Filled 2015-08-13: qty 1

## 2015-08-13 MED ORDER — PALONOSETRON HCL INJECTION 0.25 MG/5ML
INTRAVENOUS | Status: AC
  Filled 2015-08-13: qty 5

## 2015-08-13 MED ORDER — PALONOSETRON HCL INJECTION 0.25 MG/5ML
0.2500 mg | Freq: Once | INTRAVENOUS | Status: AC
  Administered 2015-08-13: 0.25 mg via INTRAVENOUS

## 2015-08-13 MED ORDER — SODIUM CHLORIDE 0.9 % IJ SOLN
10.0000 mL | INTRAMUSCULAR | Status: DC | PRN
  Administered 2015-08-13: 10 mL
  Filled 2015-08-13: qty 10

## 2015-08-13 MED ORDER — SODIUM CHLORIDE 0.9 % IV SOLN
Freq: Once | INTRAVENOUS | Status: AC
  Administered 2015-08-13: 14:00:00 via INTRAVENOUS

## 2015-08-13 MED ORDER — HEPARIN SOD (PORK) LOCK FLUSH 100 UNIT/ML IV SOLN
500.0000 [IU] | Freq: Once | INTRAVENOUS | Status: AC | PRN
  Administered 2015-08-13: 500 [IU]
  Filled 2015-08-13: qty 5

## 2015-08-13 NOTE — Progress Notes (Signed)
I faxed [employer] for poss ass with neulasta on pro kit.  [phone]

## 2015-08-13 NOTE — Progress Notes (Signed)
Patient Care Team: Tresa Garter, MD as PCP - General (Internal Medicine)  DIAGNOSIS: No matching staging information was found for the patient.  SUMMARY OF ONCOLOGIC HISTORY:   Breast cancer, left breast ([place])   04/06/2010 - 10/26/2010 Anti-estrogen oral therapy Neoadjuvant antiestrogen therapy   11/10/2010 Surgery Left breast lumpectomy: Invasive ductal carcinoma 2 cm, grade 3, angiolymphatic invasion present, 6 lymph nodes negative, ER 99%, PR 90%, Ki-67 20%, HER-2 negative ratio 1.22   12/01/2010 - 01/11/2011 Radiation Therapy Adjuvant radiation therapy   02/03/2011 -  Anti-estrogen oral therapy Femara 2.5 mg daily   02/22/2015 Imaging CT chest revealed 6 cm mediastinal mass, right hilar lymph nodes, base of neck lesions in both right and left, subtle liver metastases, periportal lymph nodes   02/28/2015 PET scan Bulky bilateral hypermetabolic supraclavicular and mediastinal lymphadenopathy consistent with eoplasm/metastatic disease. Three hepatic metastatic lesions   03/04/2015 Initial Biopsy Mediastinal mass Biopsy: Invasive Mammary cancer ER Neg   03/19/2015 -  Chemotherapy Halaven day 1 and day 8 every 3 weeks    CHIEF COMPLIANT:  Complains of hoarseness of voice and left neck pain , cycle 8 Halaven  INTERVAL HISTORY: Michele Hale is a  69 year old with above-mentioned history metastatic breast cancer currently on palliative chemotherapy with Halaven.  She is now receiving it once every 3 weeks as a maintenance treatment. She reports no major new complaints or concerns. She complains of left neck pain which bothers her at night. She also complains of hoarseness of voice which has been getting worse recently.  She also has mild fatigue.  REVIEW OF SYSTEMS:   Constitutional: Denies fevers, chills or abnormal weight loss Eyes: Denies blurriness of vision Ears, nose, mouth, throat, and face: Denies mucositis or sore throat Respiratory: Denies cough, dyspnea or wheezes Cardiovascular:  Denies palpitation, chest discomfort or lower extremity swelling Gastrointestinal:  Denies nausea, heartburn or change in bowel habits Skin: Denies abnormal skin rashes Lymphatics: Denies new lymphadenopathy or easy bruising Neurological:Denies numbness, tingling or new weaknesses Behavioral/Psych: Mood is stable, no new changes  Breast:  denies any pain or lumps or nodules in either breasts All other systems were reviewed with the patient and are negative.  I have reviewed the past medical history, past surgical history, social history and family history with the patient and they are unchanged from previous note.  ALLERGIES:  has No Known Allergies.  MEDICATIONS:  Current Outpatient Prescriptions  Medication Sig Dispense Refill  . acetaminophen (TYLENOL) 500 MG tablet Take 1,000 mg by mouth every 6 (six) hours as needed. For arthritis pain    . calcium-vitamin D (OSCAL WITH D) 500-200 MG-UNIT per tablet Take 1 tablet by mouth 2 (two) times daily.    . cholecalciferol (VITAMIN D) 1000 UNITS tablet Take 1 tablet (1,000 Units total) by mouth daily. 30 tablet 12  . dexamethasone (DECADRON) 1 MG tablet Take 0.5 tablets (0.5 mg total) by mouth daily. 30 tablet 1  . HYDROcodone-acetaminophen (NORCO/VICODIN) 5-325 MG per tablet Take 1-2 tablets by mouth every 4 (four) hours as needed. (Patient taking differently: Take 1-2 tablets by mouth every 4 (four) hours as needed for moderate pain. ) 10 tablet 0  . ibuprofen (ADVIL,MOTRIN) 800 MG tablet Take 1 tablet (800 mg total) by mouth every 8 (eight) hours as needed. 30 tablet 3  . lidocaine-prilocaine (EMLA) cream Apply to port-a-cath one and a half hours before use.  Cover cream with plastic wrap to seal. 30 g 3  . loratadine (CLARITIN) 10 MG tablet  Take 1 tablet (10 mg total) by mouth daily. 30 tablet 3  . magnesium oxide (MAG-OX) 400 (241.3 MG) MG tablet Take 1 tablet (400 mg total) by mouth daily. 30 tablet 3  . ondansetron (ZOFRAN) 8 MG tablet  Take 1 tablet (8 mg total) by mouth 2 (two) times daily. Start the day after chemo for 2 days. Then take as needed for nausea or vomiting. 30 tablet 1  . PRESCRIPTION MEDICATION Supportive therapy [facility]    . promethazine (PHENERGAN) 25 MG tablet Take 1 tablet (25 mg total) by mouth every 6 (six) hours as needed. For nausea 60 tablet 0  . promethazine (PHENERGAN) 25 MG tablet Take 1 tablet (25 mg total) by mouth every 6 (six) hours as needed for nausea or vomiting. 30 tablet 6   No current facility-administered medications for this visit.   Facility-Administered Medications Ordered in Other Visits  Medication Dose Route Frequency Provider Last Rate Last Dose  . dexamethasone (DECADRON) 10 mg in sodium chloride 0.9 % 50 mL IVPB   Intravenous Once Nicholas Lose, MD      . eriBULin mesylate (HALAVEN) 1.7 mg in sodium chloride 0.9 % 100 mL chemo infusion  1 mg/m2 (Treatment Plan Actual) Intravenous Once Nicholas Lose, MD      . heparin lock flush 100 unit/mL  500 Units Intracatheter Once PRN Nicholas Lose, MD      . palonosetron (ALOXI) injection 0.25 mg  0.25 mg Intravenous Once Nicholas Lose, MD      . pegfilgrastim (NEULASTA ONPRO KIT) injection 6 mg  6 mg Subcutaneous Once Nicholas Lose, MD      . sodium chloride 0.9 % injection 10 mL  10 mL Intracatheter PRN Nicholas Lose, MD   10 mL at 07/22/15 1643  . sodium chloride 0.9 % injection 10 mL  10 mL Intracatheter PRN Nicholas Lose, MD        PHYSICAL EXAMINATION: ECOG PERFORMANCE STATUS: 1 - Symptomatic but completely ambulatory  Filed Vitals:   08/13/15 1234  BP: 130/64  Pulse: 86  Temp: 98.6 F (37 C)  Resp: 18   Filed Weights   08/13/15 1234  Weight: 142 lb 11.2 oz (64.728 kg)    GENERAL:alert, no distress and comfortable SKIN: skin color, texture, turgor are normal, no rashes or significant lesions EYES: normal, Conjunctiva are pink and non-injected, sclera clear OROPHARYNX:no exudate, no erythema and lips, buccal mucosa, and tongue  normal  NECK:  Tight muscles in the left side of the neck LYMPH:  no palpable lymphadenopathy in the cervical, axillary or inguinal LUNGS: clear to auscultation and percussion with normal breathing effort HEART: regular rate & rhythm and no murmurs and no lower extremity edema ABDOMEN:abdomen soft, non-tender and normal bowel sounds Musculoskeletal:no cyanosis of digits and no clubbing  NEURO: alert & oriented x 3 with fluent speech, no focal motor/sensory deficits  LABORATORY DATA:  I have reviewed the data as listed   Chemistry      Component Value Date/Time   NA 141 08/13/2015 1228   NA 141 02/21/2015 1530   K 3.6 08/13/2015 1228   K 4.0 02/21/2015 1530   CL 102 02/21/2015 1530   CL 104 01/23/2013 1420   CO2 29 08/13/2015 1228   CO2 24 02/21/2015 1530   BUN 12.4 08/13/2015 1228   BUN 13 02/21/2015 1530   CREATININE 0.7 08/13/2015 1228   CREATININE 0.54 02/21/2015 1530      Component Value Date/Time   CALCIUM 9.9 08/13/2015 1228  CALCIUM 9.5 02/21/2015 1530   ALKPHOS 58 08/13/2015 1228   ALKPHOS 63 02/07/2012 1312   AST 8 08/13/2015 1228   AST 17 02/07/2012 1312   ALT 10 08/13/2015 1228   ALT 15 02/07/2012 1312   BILITOT <0.30 08/13/2015 1228   BILITOT 0.3 02/07/2012 1312       Lab Results  Component Value Date   WBC 4.4 08/13/2015   HGB 11.6 08/13/2015   HCT 35.4 08/13/2015   MCV 83.7 08/13/2015   PLT 229 08/13/2015   NEUTROABS 2.5 08/13/2015   ASSESSMENT & PLAN:  Breast cancer, left breast Left breast cancer treated with neoadjuvant antiestrogen therapy followed by lumpectomy 11/10/2010 followed by radiation completed 01/11/2011, antiestrogen therapy with Femara 2.5 mg daily started 02/03/2011, diagnosed with metastatic disease May 2016 PET/CT 03/04/15: Bulky metastatic disease in supraclavicular and Mediastinal LN and 3 hepatic lesions.  Biopsy of the mediastinal LN: Metastatic Breast cancer ER 0%, PR 0%, Ki 67 60%, Her 2 Neg  Current treatment: Halaven  started 03/19/15; completed 6 cycles 07/02/15 , now on maintenance Halaven  Cycle 8  Maintenance therapy: Because of patient developed profound neuropathy, we have decided to change chemotherapy to day 1 every 3 weeks of Halaven. We will give Neulasta with the first day of each cycle.  Neuropathy: Chemotherapy induced. I prescribed magnesium supplementation. She will also take oral B-12 over-the-counter. Because of all these measures her symptoms have markedly improved.  PET-CT: 07/18/15: Continued response to therapy. Residual tumor in ant mediastinum decreased in size.  RTC q 3 weeks for chemo Halaven maintenance Goal of treatment: palliation Neck pain: I encouraged her to use a hot pack as well as to take tonic water. Hoarseness otherwise: I suspect acid reflux. I gave her instructions on preventing acid reflux as well as taking over-the-counter preponderance for the next 2 weeks.  Patient will remain on Halaven as long as she is responding and as long as she can tolerate it.   No orders of the defined types were placed in this encounter.   The patient has a good understanding of the overall plan. she agrees with it. she will call with any problems that may develop before the next visit here.   Rulon Eisenmenger, MD 08/13/2015

## 2015-08-13 NOTE — Assessment & Plan Note (Signed)
Left breast cancer treated with neoadjuvant antiestrogen therapy followed by lumpectomy 11/10/2010 followed by radiation completed 01/11/2011, antiestrogen therapy with Femara 2.5 mg daily started 02/03/2011, diagnosed with metastatic disease May 2016 PET/CT 03/04/15: Bulky metastatic disease in supraclavicular and Mediastinal LN and 3 hepatic lesions.  Biopsy of the mediastinal LN: Metastatic Breast cancer ER 0%, PR 0%, Ki 67 60%, Her 2 Neg  Current treatment: Halaven started 03/19/15; completed 6 cycles 07/02/15 , now on maintenance Halaven  Maintenance therapy: Because of patient developed profound neuropathy, we have decided to change chemotherapy to day 1 every 3 weeks of Halaven. We will give Neulasta with the first day of each cycle.  Neuropathy: Chemotherapy induced. I prescribed magnesium supplementation. She will also take oral B-12 over-the-counter. Because of all these measures her symptoms have markedly improved.  PET-CT: 07/18/15: Continued response to therapy. Residual tumor in ant mediastinum decreased in size.  RTC q 3 weeks for chemo Halaven maintenance Goal of treatment: palliation Patient will remain on Halaven as long as she is responding and as long as she can tolerate it.

## 2015-08-13 NOTE — Patient Instructions (Signed)
[facility] Discharge Instructions for Patients Receiving Chemotherapy  Today you received the following chemotherapy agents: Halaven  To help prevent nausea and vomiting after your treatment, we encourage you to take your nausea medication as directed.    If you develop nausea and vomiting that is not controlled by your nausea medication, call the clinic.   BELOW ARE SYMPTOMS THAT SHOULD BE REPORTED IMMEDIATELY:  *FEVER GREATER THAN 100.5 F  *CHILLS WITH OR WITHOUT FEVER  NAUSEA AND VOMITING THAT IS NOT CONTROLLED WITH YOUR NAUSEA MEDICATION  *UNUSUAL SHORTNESS OF BREATH  *UNUSUAL BRUISING OR BLEEDING  TENDERNESS IN MOUTH AND THROAT WITH OR WITHOUT PRESENCE OF ULCERS  *URINARY PROBLEMS  *BOWEL PROBLEMS  UNUSUAL RASH Items with * indicate a potential emergency and should be followed up as soon as possible.  Feel free to call the clinic you have any questions or concerns. The clinic phone number is [phone].  Please show the CHEMO ALERT CARD at check-in to the Emergency Department and triage nurse.   

## 2015-08-13 NOTE — Telephone Encounter (Signed)
GAVE RELATIVE AVS REPORT AND APPOINTMENTS FOR November 2016.

## 2015-08-13 NOTE — Progress Notes (Signed)
I placed [employer] asst form on desk of nurse for dr. Lindi Adie for poss asst with haleven

## 2015-08-13 NOTE — Addendum Note (Signed)
Addended by: Prentiss Bells on: 08/13/2015 06:06 PM   Modules accepted: Orders, Medications

## 2015-08-18 ENCOUNTER — Encounter: Payer: Self-pay | Admitting: Hematology and Oncology

## 2015-08-18 NOTE — Progress Notes (Signed)
It looks like [employer] forms were faxed?? I refaxed just in case [phone] for poss asst with halaven

## 2015-08-21 ENCOUNTER — Encounter: Payer: Self-pay | Admitting: Hematology and Oncology

## 2015-08-21 NOTE — Progress Notes (Signed)
I called [employer] back to give icd-10 code to [place]. [phone]

## 2015-08-22 ENCOUNTER — Encounter: Payer: Self-pay | Admitting: Hematology and Oncology

## 2015-08-22 NOTE — Progress Notes (Signed)
Per [employer] they recd fax that nurse faxed--see prev notes.. They are processing(insurance verf), etc.

## 2015-08-27 ENCOUNTER — Encounter: Payer: Self-pay | Admitting: Hematology and Oncology

## 2015-08-27 NOTE — Progress Notes (Signed)
Per [employer] no asst available for halaven because the patient has insurance. See prev notes.

## 2015-09-01 ENCOUNTER — Encounter: Payer: Self-pay | Admitting: Hematology and Oncology

## 2015-09-01 NOTE — Progress Notes (Signed)
I refaxed to [employer] for poss asst with neulasta [phone]

## 2015-09-02 NOTE — Assessment & Plan Note (Signed)
Left breast cancer treated with neoadjuvant antiestrogen therapy followed by lumpectomy 11/10/2010 followed by radiation completed 01/11/2011, antiestrogen therapy with Femara 2.5 mg daily started 02/03/2011, diagnosed with metastatic disease May 2016 PET/CT 03/04/15: Bulky metastatic disease in supraclavicular and Mediastinal LN and 3 hepatic lesions.  Biopsy of the mediastinal LN: Metastatic Breast cancer ER 0%, PR 0%, Ki 67 60%, Her 2 Neg  Current treatment: Halaven started 03/19/15; completed 6 cycles 07/02/15 , now on maintenance Halaven Cycle 9  Maintenance therapy: Because of patient developed profound neuropathy, we have decided to change chemotherapy to day 1 every 3 weeks of Halaven. We will give Neulasta with the first day of each cycle.  Neuropathy: Chemotherapy induced. I prescribed magnesium supplementation. She will also take oral B-12 over-the-counter. Because of all these measures her symptoms have markedly improved.  PET-CT: 07/18/15: Continued response to therapy. Residual tumor in ant mediastinum decreased in size.  RTC q 3 weeks for chemo Halaven maintenance Goal of treatment: palliation Neck pain: I encouraged her to use a hot pack as well as to take tonic water. Hoarseness otherwise: I suspect acid reflux. I gave her instructions on preventing acid reflux as well as taking over-the-counter preponderance for the next 2 weeks.  Patient will remain on Halaven as long as she is responding and as long as she can tolerate it.

## 2015-09-03 ENCOUNTER — Ambulatory Visit (HOSPITAL_BASED_OUTPATIENT_CLINIC_OR_DEPARTMENT_OTHER): Payer: Medicaid Other | Admitting: Hematology and Oncology

## 2015-09-03 ENCOUNTER — Other Ambulatory Visit (HOSPITAL_BASED_OUTPATIENT_CLINIC_OR_DEPARTMENT_OTHER): Payer: Medicaid Other

## 2015-09-03 ENCOUNTER — Encounter: Payer: Self-pay | Admitting: Hematology and Oncology

## 2015-09-03 ENCOUNTER — Telehealth: Payer: Self-pay | Admitting: Hematology and Oncology

## 2015-09-03 ENCOUNTER — Ambulatory Visit (HOSPITAL_BASED_OUTPATIENT_CLINIC_OR_DEPARTMENT_OTHER): Payer: Medicaid Other

## 2015-09-03 VITALS — BP 152/87 | HR 94 | Temp 98.2°F | Resp 18 | Ht 65.0 in | Wt 144.7 lb

## 2015-09-03 DIAGNOSIS — C50912 Malignant neoplasm of unspecified site of left female breast: Secondary | ICD-10-CM

## 2015-09-03 DIAGNOSIS — Z5111 Encounter for antineoplastic chemotherapy: Secondary | ICD-10-CM

## 2015-09-03 DIAGNOSIS — C778 Secondary and unspecified malignant neoplasm of lymph nodes of multiple regions: Secondary | ICD-10-CM

## 2015-09-03 DIAGNOSIS — C50412 Malignant neoplasm of upper-outer quadrant of left female breast: Secondary | ICD-10-CM

## 2015-09-03 DIAGNOSIS — K769 Liver disease, unspecified: Secondary | ICD-10-CM | POA: Diagnosis not present

## 2015-09-03 DIAGNOSIS — G620 Drug-induced polyneuropathy: Secondary | ICD-10-CM | POA: Diagnosis not present

## 2015-09-03 DIAGNOSIS — R490 Dysphonia: Secondary | ICD-10-CM

## 2015-09-03 DIAGNOSIS — J029 Acute pharyngitis, unspecified: Secondary | ICD-10-CM | POA: Diagnosis not present

## 2015-09-03 LAB — COMPREHENSIVE METABOLIC PANEL (CC13)
ALT: 9 U/L (ref 0–55)
AST: 12 U/L (ref 5–34)
Albumin: 3.5 g/dL (ref 3.5–5.0)
Alkaline Phosphatase: 63 U/L (ref 40–150)
Anion Gap: 10 mEq/L (ref 3–11)
BILIRUBIN TOTAL: 0.34 mg/dL (ref 0.20–1.20)
BUN: 13.1 mg/dL (ref 7.0–26.0)
CHLORIDE: 104 meq/L (ref 98–109)
CO2: 24 meq/L (ref 22–29)
CREATININE: 0.6 mg/dL (ref 0.6–1.1)
Calcium: 9.4 mg/dL (ref 8.4–10.4)
GLUCOSE: 131 mg/dL (ref 70–140)
Potassium: 4 mEq/L (ref 3.5–5.1)
SODIUM: 137 meq/L (ref 136–145)
TOTAL PROTEIN: 6.8 g/dL (ref 6.4–8.3)

## 2015-09-03 LAB — CBC WITH DIFFERENTIAL/PLATELET
BASO%: 0.8 % (ref 0.0–2.0)
Basophils Absolute: 0 10*3/uL (ref 0.0–0.1)
EOS%: 2.1 % (ref 0.0–7.0)
Eosinophils Absolute: 0.1 10*3/uL (ref 0.0–0.5)
HCT: 36.6 % (ref 34.8–46.6)
HGB: 12 g/dL (ref 11.6–15.9)
LYMPH%: 27.4 % (ref 14.0–49.7)
MCH: 27 pg (ref 25.1–34.0)
MCHC: 32.9 g/dL (ref 31.5–36.0)
MCV: 82.1 fL (ref 79.5–101.0)
MONO#: 0.4 10*3/uL (ref 0.1–0.9)
MONO%: 11.3 % (ref 0.0–14.0)
NEUT%: 58.4 % (ref 38.4–76.8)
NEUTROS ABS: 1.9 10*3/uL (ref 1.5–6.5)
Platelets: 263 10*3/uL (ref 145–400)
RBC: 4.45 10*6/uL (ref 3.70–5.45)
RDW: 16.2 % — ABNORMAL HIGH (ref 11.2–14.5)
WBC: 3.3 10*3/uL — AB (ref 3.9–10.3)
lymph#: 0.9 10*3/uL (ref 0.9–3.3)

## 2015-09-03 MED ORDER — PALONOSETRON HCL INJECTION 0.25 MG/5ML
0.2500 mg | Freq: Once | INTRAVENOUS | Status: AC
  Administered 2015-09-03: 0.25 mg via INTRAVENOUS

## 2015-09-03 MED ORDER — PALONOSETRON HCL INJECTION 0.25 MG/5ML
INTRAVENOUS | Status: AC
  Filled 2015-09-03: qty 5

## 2015-09-03 MED ORDER — SODIUM CHLORIDE 0.9 % IV SOLN
Freq: Once | INTRAVENOUS | Status: AC
  Administered 2015-09-03: 15:00:00 via INTRAVENOUS
  Filled 2015-09-03: qty 1

## 2015-09-03 MED ORDER — SODIUM CHLORIDE 0.9 % IJ SOLN
10.0000 mL | INTRAMUSCULAR | Status: DC | PRN
  Administered 2015-09-03: 10 mL
  Filled 2015-09-03: qty 10

## 2015-09-03 MED ORDER — HEPARIN SOD (PORK) LOCK FLUSH 100 UNIT/ML IV SOLN
500.0000 [IU] | Freq: Once | INTRAVENOUS | Status: AC | PRN
  Administered 2015-09-03: 500 [IU]
  Filled 2015-09-03: qty 5

## 2015-09-03 MED ORDER — SODIUM CHLORIDE 0.9 % IV SOLN
1.0000 mg/m2 | Freq: Once | INTRAVENOUS | Status: AC
  Administered 2015-09-03: 1.7 mg via INTRAVENOUS
  Filled 2015-09-03: qty 3.4

## 2015-09-03 MED ORDER — FLUCONAZOLE 100 MG PO TABS: 100.0000 mg | ORAL_TABLET | Freq: Every day | ORAL | Status: DC

## 2015-09-03 MED ORDER — PEGFILGRASTIM 6 MG/0.6ML ~~LOC~~ PSKT
6.0000 mg | PREFILLED_SYRINGE | Freq: Once | SUBCUTANEOUS | Status: AC
  Administered 2015-09-03: 6 mg via SUBCUTANEOUS
  Filled 2015-09-03: qty 0.6

## 2015-09-03 MED ORDER — SODIUM CHLORIDE 0.9 % IV SOLN
Freq: Once | INTRAVENOUS | Status: AC
  Administered 2015-09-03: 14:00:00 via INTRAVENOUS

## 2015-09-03 NOTE — Telephone Encounter (Signed)
Added md appointment per pof and sent michelle to help with earlier chemo.  Patient will get a new avs in chemo

## 2015-09-03 NOTE — Addendum Note (Signed)
Addended by: Prentiss Bells on: 09/03/2015 04:16 PM   Modules accepted: Medications

## 2015-09-03 NOTE — Patient Instructions (Signed)
[facility] Discharge Instructions for Patients Receiving Chemotherapy  Today you received the following chemotherapy agents: Halaven  To help prevent nausea and vomiting after your treatment, we encourage you to take your nausea medication as directed.    If you develop nausea and vomiting that is not controlled by your nausea medication, call the clinic.   BELOW ARE SYMPTOMS THAT SHOULD BE REPORTED IMMEDIATELY:  *FEVER GREATER THAN 100.5 F  *CHILLS WITH OR WITHOUT FEVER  NAUSEA AND VOMITING THAT IS NOT CONTROLLED WITH YOUR NAUSEA MEDICATION  *UNUSUAL SHORTNESS OF BREATH  *UNUSUAL BRUISING OR BLEEDING  TENDERNESS IN MOUTH AND THROAT WITH OR WITHOUT PRESENCE OF ULCERS  *URINARY PROBLEMS  *BOWEL PROBLEMS  UNUSUAL RASH Items with * indicate a potential emergency and should be followed up as soon as possible.  Feel free to call the clinic you have any questions or concerns. The clinic phone number is [phone].  Please show the CHEMO ALERT CARD at check-in to the Emergency Department and triage nurse.   

## 2015-09-03 NOTE — Progress Notes (Signed)
Patient Care Team: Tresa Garter, MD as PCP - General (Internal Medicine)  DIAGNOSIS: No matching staging information was found for the patient.  SUMMARY OF ONCOLOGIC HISTORY:   Breast cancer, left breast ([facility])   04/06/2010 - 10/26/2010 Anti-estrogen oral therapy Neoadjuvant antiestrogen therapy   11/10/2010 Surgery Left breast lumpectomy: Invasive ductal carcinoma 2 cm, grade 3, angiolymphatic invasion present, 6 lymph nodes negative, ER 99%, PR 90%, Ki-67 20%, HER-2 negative ratio 1.22   12/01/2010 - 01/11/2011 Radiation Therapy Adjuvant radiation therapy   02/03/2011 -  Anti-estrogen oral therapy Femara 2.5 mg daily   02/22/2015 Imaging CT chest revealed 6 cm mediastinal mass, right hilar lymph nodes, base of neck lesions in both right and left, subtle liver metastases, periportal lymph nodes   02/28/2015 PET scan Bulky bilateral hypermetabolic supraclavicular and mediastinal lymphadenopathy consistent with eoplasm/metastatic disease. Three hepatic metastatic lesions   03/04/2015 Initial Biopsy Mediastinal mass Biopsy: Invasive Mammary cancer ER Neg   03/19/2015 -  Chemotherapy Halaven day 1 and day 8 every 3 weeks    CHIEF COMPLIANT: metastatic breast cancer on halaven  INTERVAL HISTORY: Michele Hale is a 69 year old with above-mentioned history of metastatic breast cancer currently on palliative chemotherapy with Halaven. She has been tolerating it extremely well without any major problems. She has sore throat as well as a tender spot on the scalp. She also complained of a knot behind the right earlobe. Denied any nausea vomiting or neuropathy.  REVIEW OF SYSTEMS:   Constitutional: Denies fevers, chills or abnormal weight loss Eyes: Denies blurriness of vision Ears, nose, mouth, throat, and face: Denies mucositis or sore throat Respiratory: Denies cough, dyspnea or wheezes Cardiovascular: Denies palpitation, chest discomfort or lower extremity swelling Gastrointestinal:  Denies  nausea, heartburn or change in bowel habits Skin: Denies abnormal skin rashes Lymphatics: Denies new lymphadenopathy or easy bruising Neurological:Denies numbness, tingling or new weaknesses Behavioral/Psych: Mood is stable, no new changes   All other systems were reviewed with the patient and are negative.  I have reviewed the past medical history, past surgical history, social history and family history with the patient and they are unchanged from previous note.  ALLERGIES:  has No Known Allergies.  MEDICATIONS:  Current Outpatient Prescriptions  Medication Sig Dispense Refill  . acetaminophen (TYLENOL) 500 MG tablet Take 1,000 mg by mouth every 6 (six) hours as needed. For arthritis pain    . calcium-vitamin D (OSCAL WITH D) 500-200 MG-UNIT per tablet Take 1 tablet by mouth 2 (two) times daily.    . cholecalciferol (VITAMIN D) 1000 UNITS tablet Take 1 tablet (1,000 Units total) by mouth daily. 30 tablet 12  . dexamethasone (DECADRON) 1 MG tablet Take 0.5 tablets (0.5 mg total) by mouth daily. 30 tablet 1  . fluconazole (DIFLUCAN) 100 MG tablet Take 1 tablet (100 mg total) by mouth daily. 7 tablet 0  . HYDROcodone-acetaminophen (NORCO/VICODIN) 5-325 MG per tablet Take 1-2 tablets by mouth every 4 (four) hours as needed. (Patient taking differently: Take 1-2 tablets by mouth every 4 (four) hours as needed for moderate pain. ) 10 tablet 0  . ibuprofen (ADVIL,MOTRIN) 800 MG tablet Take 1 tablet (800 mg total) by mouth every 8 (eight) hours as needed. 30 tablet 3  . lidocaine-prilocaine (EMLA) cream Apply to port-a-cath one and a half hours before use.  Cover cream with plastic wrap to seal. 30 g 3  . loratadine (CLARITIN) 10 MG tablet Take 1 tablet (10 mg total) by mouth daily. 30 tablet 3  .  magnesium oxide (MAG-OX) 400 (241.3 MG) MG tablet Take 1 tablet (400 mg total) by mouth daily. 30 tablet 3  . ondansetron (ZOFRAN) 8 MG tablet Take 1 tablet (8 mg total) by mouth 2 (two) times daily.  Start the day after chemo for 2 days. Then take as needed for nausea or vomiting. 30 tablet 1  . promethazine (PHENERGAN) 25 MG tablet Take 1 tablet (25 mg total) by mouth every 6 (six) hours as needed for nausea or vomiting. 30 tablet 6   No current facility-administered medications for this visit.   Facility-Administered Medications Ordered in Other Visits  Medication Dose Route Frequency Provider Last Rate Last Dose  . sodium chloride 0.9 % injection 10 mL  10 mL Intracatheter PRN Nicholas Lose, MD   10 mL at 07/22/15 1643    PHYSICAL EXAMINATION: ECOG PERFORMANCE STATUS: 1 - Symptomatic but completely ambulatory  Filed Vitals:   09/03/15 1355  BP: 152/87  Pulse: 94  Temp: 98.2 F (36.8 C)  Resp: 18   Filed Weights   09/03/15 1355  Weight: 144 lb 11.2 oz (65.635 kg)    GENERAL:alert, no distress and comfortable SKIN: skin color, texture, turgor are normal, no rashes or significant lesions EYES: normal, Conjunctiva are pink and non-injected, sclera clear OROPHARYNX:no exudate, no erythema and lips, buccal mucosa, and tongue normal  NECK: supple, thyroid normal size, non-tender, without nodularity LYMPH:  no palpable lymphadenopathy in the cervical, axillary or inguinal LUNGS: clear to auscultation and percussion with normal breathing effort HEART: regular rate & rhythm and no murmurs and no lower extremity edema ABDOMEN:abdomen soft, non-tender and normal bowel sounds Musculoskeletal:no cyanosis of digits and no clubbing  NEURO: alert & oriented x 3 with fluent speech, no focal motor/sensory deficits  LABORATORY DATA:  I have reviewed the data as listed   Chemistry      Component Value Date/Time   NA 137 09/03/2015 1338   NA 141 02/21/2015 1530   K 4.0 09/03/2015 1338   K 4.0 02/21/2015 1530   CL 102 02/21/2015 1530   CL 104 01/23/2013 1420   CO2 24 09/03/2015 1338   CO2 24 02/21/2015 1530   BUN 13.1 09/03/2015 1338   BUN 13 02/21/2015 1530   CREATININE 0.6  09/03/2015 1338   CREATININE 0.54 02/21/2015 1530      Component Value Date/Time   CALCIUM 9.4 09/03/2015 1338   CALCIUM 9.5 02/21/2015 1530   ALKPHOS 63 09/03/2015 1338   ALKPHOS 63 02/07/2012 1312   AST 12 09/03/2015 1338   AST 17 02/07/2012 1312   ALT 9 09/03/2015 1338   ALT 15 02/07/2012 1312   BILITOT 0.34 09/03/2015 1338   BILITOT 0.3 02/07/2012 1312       Lab Results  Component Value Date   WBC 3.3* 09/03/2015   HGB 12.0 09/03/2015   HCT 36.6 09/03/2015   MCV 82.1 09/03/2015   PLT 263 09/03/2015   NEUTROABS 1.9 09/03/2015   ASSESSMENT & PLAN:  Breast cancer, left breast Left breast cancer treated with neoadjuvant antiestrogen therapy followed by lumpectomy 11/10/2010 followed by radiation completed 01/11/2011, antiestrogen therapy with Femara 2.5 mg daily started 02/03/2011, diagnosed with metastatic disease May 2016 PET/CT 03/04/15: Bulky metastatic disease in supraclavicular and Mediastinal LN and 3 hepatic lesions.  Biopsy of the mediastinal LN: Metastatic Breast cancer ER 0%, PR 0%, Ki 67 60%, Her 2 Neg  Current treatment: Halaven started 03/19/15; completed 6 cycles 07/02/15 , now on maintenance Halaven Cycle 9  Maintenance  therapy: Because of patient developed profound neuropathy, we have decided to change chemotherapy to day 1 every 3 weeks of Halaven. We will give Neulasta with the first day of each cycle.  Neuropathy: Chemotherapy induced. I prescribed magnesium supplementation. She will also take oral B-12 over-the-counter. Because of all these measures her symptoms have markedly improved.  PET-CT: 07/18/15: Continued response to therapy. Residual tumor in ant mediastinum decreased in size.  RTC q 3 weeks for chemo Halaven maintenance Goal of treatment: palliation Neck pain: improved. Hoarseness and sore throat: we will prescribe her Diflucan 100 mg daily for 7 days  Plan: CT chest abdomen pelvis with contrast to be done in 3 weeks and follow-up with the  next cycle The scan shows progressive disease, we might either change therapy or increase the halaven back to 2 weeks on 1 week off schedule.  Patient will remain on Halaven as long as she is responding and as long as she can tolerate it.   Orders Placed This Encounter  Procedures  . CT Abdomen Pelvis W Contrast    Standing Status: Future     Number of Occurrences:      Standing Expiration Date: 09/02/2016    Order Specific Question:  If indicated for the ordered procedure, I authorize the administration of contrast media per Radiology protocol    Answer:  Yes    Order Specific Question:  Reason for Exam (SYMPTOM  OR DIAGNOSIS REQUIRED)    Answer:  restaging Metastatic breast cancer on chemo    Order Specific Question:  Preferred imaging location?    Answer:  [facility]  . CT Chest W Contrast    Standing Status: Future     Number of Occurrences:      Standing Expiration Date: 09/02/2016    Order Specific Question:  If indicated for the ordered procedure, I authorize the administration of contrast media per Radiology protocol    Answer:  Yes    Order Specific Question:  Reason for Exam (SYMPTOM  OR DIAGNOSIS REQUIRED)    Answer:  restaging Metastatic breast cancer on chemo    Order Specific Question:  Preferred imaging location?    Answer:  [facility]   The patient has a good understanding of the overall plan. she agrees with it. she will call with any problems that may develop before the next visit here.   Rulon Eisenmenger, MD 09/03/2015     No benefits

## 2015-09-22 ENCOUNTER — Encounter (HOSPITAL_COMMUNITY): Payer: Self-pay

## 2015-09-22 ENCOUNTER — Ambulatory Visit (HOSPITAL_COMMUNITY)
Admission: RE | Admit: 2015-09-22 | Discharge: 2015-09-22 | Disposition: A | Discharge status: Home / Self Care | Payer: Medicaid Other | Source: Ambulatory Visit | Attending: Hematology and Oncology | Admitting: Hematology and Oncology

## 2015-09-22 DIAGNOSIS — C50412 Malignant neoplasm of upper-outer quadrant of left female breast: Secondary | ICD-10-CM | POA: Insufficient documentation

## 2015-09-22 DIAGNOSIS — Z9221 Personal history of antineoplastic chemotherapy: Secondary | ICD-10-CM | POA: Insufficient documentation

## 2015-09-22 DIAGNOSIS — M898X8 Other specified disorders of bone, other site: Secondary | ICD-10-CM | POA: Diagnosis not present

## 2015-09-22 MED ORDER — IOHEXOL 300 MG/ML  SOLN
100.0000 mL | Freq: Once | INTRAMUSCULAR | Status: AC | PRN
  Administered 2015-09-22: 100 mL via INTRAVENOUS

## 2015-09-22 MED ORDER — IOHEXOL 300 MG/ML  SOLN
50.0000 mL | Freq: Once | INTRAMUSCULAR | Status: AC | PRN
  Administered 2015-09-22: 50 mL via ORAL

## 2015-09-24 ENCOUNTER — Ambulatory Visit (HOSPITAL_BASED_OUTPATIENT_CLINIC_OR_DEPARTMENT_OTHER): Payer: Medicaid Other | Admitting: Hematology and Oncology

## 2015-09-24 ENCOUNTER — Telehealth: Payer: Self-pay | Admitting: Hematology and Oncology

## 2015-09-24 ENCOUNTER — Other Ambulatory Visit (HOSPITAL_BASED_OUTPATIENT_CLINIC_OR_DEPARTMENT_OTHER): Payer: Medicaid Other

## 2015-09-24 ENCOUNTER — Ambulatory Visit (HOSPITAL_BASED_OUTPATIENT_CLINIC_OR_DEPARTMENT_OTHER): Payer: Medicaid Other

## 2015-09-24 ENCOUNTER — Encounter: Payer: Self-pay | Admitting: Hematology and Oncology

## 2015-09-24 ENCOUNTER — Ambulatory Visit: Payer: Self-pay

## 2015-09-24 VITALS — BP 149/75 | HR 87 | Temp 97.8°F | Resp 18 | Ht 65.0 in | Wt 145.6 lb

## 2015-09-24 DIAGNOSIS — C50912 Malignant neoplasm of unspecified site of left female breast: Secondary | ICD-10-CM

## 2015-09-24 DIAGNOSIS — C778 Secondary and unspecified malignant neoplasm of lymph nodes of multiple regions: Secondary | ICD-10-CM

## 2015-09-24 DIAGNOSIS — C7801 Secondary malignant neoplasm of right lung: Secondary | ICD-10-CM

## 2015-09-24 DIAGNOSIS — C787 Secondary malignant neoplasm of liver and intrahepatic bile duct: Secondary | ICD-10-CM | POA: Diagnosis not present

## 2015-09-24 DIAGNOSIS — D61810 Antineoplastic chemotherapy induced pancytopenia: Secondary | ICD-10-CM | POA: Diagnosis not present

## 2015-09-24 DIAGNOSIS — C7800 Secondary malignant neoplasm of unspecified lung: Secondary | ICD-10-CM | POA: Insufficient documentation

## 2015-09-24 DIAGNOSIS — M542 Cervicalgia: Secondary | ICD-10-CM | POA: Diagnosis not present

## 2015-09-24 DIAGNOSIS — C50512 Malignant neoplasm of lower-outer quadrant of left female breast: Secondary | ICD-10-CM | POA: Diagnosis present

## 2015-09-24 DIAGNOSIS — Z5111 Encounter for antineoplastic chemotherapy: Secondary | ICD-10-CM | POA: Diagnosis present

## 2015-09-24 DIAGNOSIS — G620 Drug-induced polyneuropathy: Secondary | ICD-10-CM

## 2015-09-24 DIAGNOSIS — T451X5A Adverse effect of antineoplastic and immunosuppressive drugs, initial encounter: Secondary | ICD-10-CM

## 2015-09-24 LAB — COMPREHENSIVE METABOLIC PANEL (CC13)
ALBUMIN: 3.6 g/dL (ref 3.5–5.0)
ALK PHOS: 61 U/L (ref 40–150)
AST: 11 U/L (ref 5–34)
Anion Gap: 9 mEq/L (ref 3–11)
BUN: 16 mg/dL (ref 7.0–26.0)
CO2: 26 mEq/L (ref 22–29)
Calcium: 9.2 mg/dL (ref 8.4–10.4)
Chloride: 102 mEq/L (ref 98–109)
Creatinine: 0.8 mg/dL (ref 0.6–1.1)
EGFR: >90 mL/min/{1.73_m2} (ref >90)
GLUCOSE: 111 mg/dL (ref 70–140)
POTASSIUM: 3.7 meq/L (ref 3.5–5.1)
SODIUM: 136 meq/L (ref 136–145)
Total Bilirubin: 0.37 mg/dL (ref 0.20–1.20)
Total Protein: 6.9 g/dL (ref 6.4–8.3)

## 2015-09-24 LAB — CBC WITH DIFFERENTIAL/PLATELET
BASO%: 0.8 % (ref 0.0–2.0)
Basophils Absolute: 0 10*3/uL (ref 0.0–0.1)
EOS%: 2.7 % (ref 0.0–7.0)
Eosinophils Absolute: 0.1 10*3/uL (ref 0.0–0.5)
HCT: 35.5 % (ref 34.8–46.6)
HEMOGLOBIN: 11.4 g/dL — AB (ref 11.6–15.9)
LYMPH%: 27.8 % (ref 14.0–49.7)
MCH: 26.7 pg (ref 25.1–34.0)
MCHC: 32.1 g/dL (ref 31.5–36.0)
MCV: 83.2 fL (ref 79.5–101.0)
MONO#: 0.3 10*3/uL (ref 0.1–0.9)
MONO%: 12.2 % (ref 0.0–14.0)
NEUT%: 56.5 % (ref 38.4–76.8)
NEUTROS ABS: 1.4 10*3/uL — AB (ref 1.5–6.5)
Platelets: 267 10*3/uL (ref 145–400)
RBC: 4.26 10*6/uL (ref 3.70–5.45)
RDW: 15.5 % — AB (ref 11.2–14.5)
WBC: 2.5 10*3/uL — AB (ref 3.9–10.3)
lymph#: 0.7 10*3/uL — ABNORMAL LOW (ref 0.9–3.3)

## 2015-09-24 MED ORDER — PALONOSETRON HCL INJECTION 0.25 MG/5ML
INTRAVENOUS | Status: AC
  Filled 2015-09-24: qty 5

## 2015-09-24 MED ORDER — PALONOSETRON HCL INJECTION 0.25 MG/5ML
0.2500 mg | Freq: Once | INTRAVENOUS | Status: AC
  Administered 2015-09-24: 0.25 mg via INTRAVENOUS

## 2015-09-24 MED ORDER — SODIUM CHLORIDE 0.9 % IV SOLN
1.0000 mg/m2 | Freq: Once | INTRAVENOUS | Status: AC
  Administered 2015-09-24: 1.7 mg via INTRAVENOUS
  Filled 2015-09-24: qty 3.4

## 2015-09-24 MED ORDER — SODIUM CHLORIDE 0.9 % IV SOLN
Freq: Once | INTRAVENOUS | Status: AC
  Administered 2015-09-24: 14:00:00 via INTRAVENOUS

## 2015-09-24 MED ORDER — SODIUM CHLORIDE 0.9 % IJ SOLN
10.0000 mL | INTRAMUSCULAR | Status: DC | PRN
  Administered 2015-09-24: 10 mL
  Filled 2015-09-24: qty 10

## 2015-09-24 MED ORDER — SODIUM CHLORIDE 0.9 % IV SOLN
Freq: Once | INTRAVENOUS | Status: AC
  Administered 2015-09-24: 14:00:00 via INTRAVENOUS
  Filled 2015-09-24: qty 1

## 2015-09-24 MED ORDER — HEPARIN SOD (PORK) LOCK FLUSH 100 UNIT/ML IV SOLN
500.0000 [IU] | Freq: Once | INTRAVENOUS | Status: AC | PRN
  Administered 2015-09-24: 500 [IU]
  Filled 2015-09-24: qty 5

## 2015-09-24 NOTE — Patient Instructions (Signed)
Eribulin solution for injection  What is this medicine?  ERIBULIN (er e bu lin) is a chemotherapy drug. It is used to treat breast cancer and liposarcoma.  This medicine may be used for other purposes; ask your health care provider or pharmacist if you have questions.  What should I tell my health care provider before I take this medicine?  They need to know if you have any of these conditions:  -heart disease  -history of irregular heartbeat  -kidney disease  -liver disease  -low blood counts, like low white cell, platelet, or red cell counts  -low levels of potassium or magnesium in the blood  -an unusual or allergic reaction to eribulin, other medicines, foods, dyes, or preservatives  -pregnant or trying to get pregnant  -breast-feeding  How should I use this medicine?  This medicine is for infusion into a vein. It is given by a health care professional in a hospital or clinic setting.  Talk to your pediatrician regarding the use of this medicine in children. Special care may be needed.  Overdosage: If you think you have taken too much of this medicine contact a poison control center or emergency room at once.  NOTE: This medicine is only for you. Do not share this medicine with others.  What if I miss a dose?  It is important not to miss your dose. Call your doctor or health care professional if you are unable to keep an appointment.  What may interact with this medicine?  Do not take this medicine with any of the following medications:  -amiodarone  -astemizole  -arsenic trioxide  -bepridil  -bretylium  -chloroquine  -chlorpromazine  -cisapride  -clarithromycin  -dextromethorphan,  quinidine  -disopyramide  -dofetilide  -droperidol  -dronedarone  -erythromycin  -grepafloxacin  -halofantrine  -haloperidol  -ibutilide  -levomethadyl  -mesoridazine  -methadone  -pentamidine  -procainamide  -quinidine  -pimozide  -posaconazole  -probucol  -propafenone  -saquinavir  -sotalol  -sparfloxacin  -terfenadine  -thioridazine  -troleandomycin  -ziprasidone  This list may not describe all possible interactions. Give your health care provider a list of all the medicines, herbs, non-prescription drugs, or dietary supplements you use. Also tell them if you smoke, drink alcohol, or use illegal drugs. Some items may interact with your medicine.  What should I watch for while using this medicine?  This drug may make you feel generally unwell. This is not uncommon, as chemotherapy can affect healthy cells as well as cancer cells. Report any side effects. Continue your course of treatment even though you feel ill unless your doctor tells you to stop.  Call your doctor or health care professional for advice if you get a fever, chills or sore throat, or other symptoms of a cold or flu. Do not treat yourself. This drug decreases your body's ability to fight infections. Try to avoid being around people who are sick.  This medicine may increase your risk to bruise or bleed. Call your doctor or health care professional if you notice any unusual bleeding.  You may need blood work done while you are taking this medicine.  Do not become pregnant while taking this medicine or for 2 weeks after stopping it. Women should inform their doctor if they wish to become pregnant or think they might be pregnant. Men should not father a child while taking this medicine and for 3.5 months after stopping it. There is a potential for serious side effects to an unborn child. Talk to your health care   professional or pharmacist for more information. Do not breast-feed an infant while taking this medicine or for 2 weeks after stopping  it.  What side effects may I notice from receiving this medicine?  Side effects that you should report to your doctor or health care professional as soon as possible:  -allergic reactions like skin rash, itching or hives, swelling of the face, lips, or tongue  -low blood counts - this medicine may decrease the number of white blood cells, red blood cells and platelets. You may be at increased risk for infections and bleeding.  -signs of infection - fever or chills, cough, sore throat, pain or difficulty passing urine  -signs of decreased platelets or bleeding - bruising, pinpoint red spots on the skin, black, tarry stools, blood in the urine  -signs of decreased red blood cells - unusually weak or tired, fainting spells, lightheadedness  -pain, tingling, numbness in the hands or feet  Side effects that usually do not require medical attention (Report these to your doctor or health care professional if they continue or are bothersome.):  -constipation  -hair loss  -headache  -loss of appetite  -muscle or joint pain  -nausea, vomiting  -stomach pain  This list may not describe all possible side effects. Call your doctor for medical advice about side effects. You may report side effects to FDA at 1-800-FDA-1088.  Where should I keep my medicine?  This drug is given in a hospital or clinic and will not be stored at home.  NOTE: This sheet is a summary. It may not cover all possible information. If you have questions about this medicine, talk to your doctor, pharmacist, or health care provider.      2016, [employer]. (2014-11-27 17:51:40)

## 2015-09-24 NOTE — Telephone Encounter (Signed)
Gave and printed appt sched and av sfor pt for DEC and Jan 2017 °

## 2015-09-24 NOTE — Assessment & Plan Note (Signed)
Left breast cancer treated with neoadjuvant antiestrogen therapy followed by lumpectomy 11/10/2010 followed by radiation completed 01/11/2011, antiestrogen therapy with Femara 2.5 mg daily started 02/03/2011, diagnosed with metastatic disease May 2016 PET/CT 03/04/15: Bulky metastatic disease in supraclavicular and Mediastinal LN and 3 hepatic lesions.  Biopsy of the mediastinal LN: Metastatic Breast cancer ER 0%, PR 0%, Ki 67 60%, Her 2 Neg  Current treatment: Halaven started 03/19/15; completed 6 cycles 07/02/15 , now on maintenance HalavenCycle 10 (treatment being changed her days 1 and 8 every 3 weeks because of progression noted on the CT scan 09/14/2015)  CT chest abdomen pelvis 09/22/2015: slight increased nodularity in the prevascular lymph node measuring 2.7 x 4.5 cm previously 2.4 x 4.5 cm, new enhancing nodule 1.7 x 1.8 cm  Chemotherapy-induced neuropathy: Much improved with B-12, magnesium supplementation Chemotherapy-induced pancytopenia: Patient to get Neulasta injection on day 8 of chemotherapy.  Return to clinic in 3 weeks for follow-up.

## 2015-09-24 NOTE — Addendum Note (Signed)
Addended by: Prentiss Bells on: 09/24/2015 04:10 PM   Modules accepted: Medications

## 2015-09-24 NOTE — Progress Notes (Signed)
Per Dr. Lindi Adie, ok to treat with anc 1.4.

## 2015-09-24 NOTE — Progress Notes (Signed)
Patient Care Team: Tresa Garter, MD as PCP - General (Internal Medicine)  DIAGNOSIS: No matching staging information was found for the patient.  SUMMARY OF ONCOLOGIC HISTORY:   Breast cancer of lower-outer quadrant of left female breast ([place])   04/06/2010 - 10/26/2010 Anti-estrogen oral therapy Neoadjuvant antiestrogen therapy   11/10/2010 Surgery Left breast lumpectomy: Invasive ductal carcinoma 2 cm, grade 3, angiolymphatic invasion present, 6 lymph nodes negative, ER 99%, PR 90%, Ki-67 20%, HER-2 negative ratio 1.22   12/01/2010 - 01/11/2011 Radiation Therapy Adjuvant radiation therapy   02/03/2011 -  Anti-estrogen oral therapy Femara 2.5 mg daily   02/22/2015 Imaging CT chest revealed 6 cm mediastinal mass, right hilar lymph nodes, base of neck lesions in both right and left, subtle liver metastases, periportal lymph nodes   02/28/2015 PET scan Bulky bilateral hypermetabolic supraclavicular and mediastinal lymphadenopathy consistent with eoplasm/metastatic disease. Three hepatic metastatic lesions   03/04/2015 Initial Biopsy Mediastinal mass Biopsy: Invasive Mammary cancer ER Neg   03/19/2015 -  Chemotherapy Halaven day 1 and day 8 every 3 weeks    CHIEF COMPLIANT: Cycle 10 Halaven and to follow-up on the CT scans  INTERVAL HISTORY: Michele Hale is a 69 year old with above-mentioned history of metastatic breast cancer with mediastinal and lung metastases as well as liver metastases. She is currently on Halaven and chemotherapy. We had moved her to maintenance Halaven and she had a recent CT scan to assess response to treatment. The CT scan shows this to mediastinal lymph node and she will now be changed to standard halaven chemotherapy with days 1 and 8 every 3 weeks. She has mild peripheral neuropathy. Complains of occasional neck pain. Behind the right ear lobe there is a firm palpable nodule.  REVIEW OF SYSTEMS:   Constitutional: Denies fevers, chills or abnormal weight loss Eyes:  Denies blurriness of vision Ears, nose, mouth, throat, and face: Denies mucositis or sore throat Respiratory: Denies cough, dyspnea or wheezes Cardiovascular: Denies palpitation, chest discomfort or lower extremity swelling Gastrointestinal:  Denies nausea, heartburn or change in bowel habits Skin: Denies abnormal skin rashes Lymphatics: Denies new lymphadenopathy or easy bruising Neurological:Denies numbness, tingling or new weaknesses Behavioral/Psych: Mood is stable, no new changes   All other systems were reviewed with the patient and are negative.  I have reviewed the past medical history, past surgical history, social history and family history with the patient and they are unchanged from previous note.  ALLERGIES:  has No Known Allergies.  MEDICATIONS:  Current Outpatient Prescriptions  Medication Sig Dispense Refill  . acetaminophen (TYLENOL) 500 MG tablet Take 1,000 mg by mouth every 6 (six) hours as needed. For arthritis pain    . calcium-vitamin D (OSCAL WITH D) 500-200 MG-UNIT per tablet Take 1 tablet by mouth 2 (two) times daily.    . cholecalciferol (VITAMIN D) 1000 UNITS tablet Take 1 tablet (1,000 Units total) by mouth daily. 30 tablet 12  . dexamethasone (DECADRON) 1 MG tablet Take 0.5 tablets (0.5 mg total) by mouth daily. 30 tablet 1  . fluconazole (DIFLUCAN) 100 MG tablet Take 1 tablet (100 mg total) by mouth daily. 7 tablet 0  . HYDROcodone-acetaminophen (NORCO/VICODIN) 5-325 MG per tablet Take 1-2 tablets by mouth every 4 (four) hours as needed. (Patient taking differently: Take 1-2 tablets by mouth every 4 (four) hours as needed for moderate pain. ) 10 tablet 0  . ibuprofen (ADVIL,MOTRIN) 800 MG tablet Take 1 tablet (800 mg total) by mouth every 8 (eight) hours as needed.  30 tablet 3  . lidocaine-prilocaine (EMLA) cream Apply to port-a-cath one and a half hours before use.  Cover cream with plastic wrap to seal. 30 g 3  . loratadine (CLARITIN) 10 MG tablet Take 1  tablet (10 mg total) by mouth daily. 30 tablet 3  . magnesium oxide (MAG-OX) 400 (241.3 MG) MG tablet Take 1 tablet (400 mg total) by mouth daily. 30 tablet 3  . ondansetron (ZOFRAN) 8 MG tablet Take 1 tablet (8 mg total) by mouth 2 (two) times daily. Start the day after chemo for 2 days. Then take as needed for nausea or vomiting. 30 tablet 1  . promethazine (PHENERGAN) 25 MG tablet Take 1 tablet (25 mg total) by mouth every 6 (six) hours as needed for nausea or vomiting. 30 tablet 6   No current facility-administered medications for this visit.   Facility-Administered Medications Ordered in Other Visits  Medication Dose Route Frequency Provider Last Rate Last Dose  . sodium chloride 0.9 % injection 10 mL  10 mL Intracatheter PRN Nicholas Lose, MD   10 mL at 07/22/15 1643    PHYSICAL EXAMINATION: ECOG PERFORMANCE STATUS: 1 - Symptomatic but completely ambulatory  Filed Vitals:   09/24/15 1147  BP: 149/75  Pulse: 87  Temp: 97.8 F (36.6 C)  Resp: 18   Filed Weights   09/24/15 1147  Weight: 145 lb 9.6 oz (66.044 kg)    GENERAL:alert, no distress and comfortable SKIN: skin color, texture, turgor are normal, no rashes or significant lesions EYES: normal, Conjunctiva are pink and non-injected, sclera clear OROPHARYNX:no exudate, no erythema and lips, buccal mucosa, and tongue normal  NECK: supple, thyroid normal size, non-tender, without nodularity LYMPH:  no palpable lymphadenopathy in the cervical, axillary or inguinal LUNGS: clear to auscultation and percussion with normal breathing effort HEART: regular rate & rhythm and no murmurs and no lower extremity edema ABDOMEN:abdomen soft, non-tender and normal bowel sounds Musculoskeletal:no cyanosis of digits and no clubbing  NEURO: alert & oriented x 3 with fluent speech, grade 1 peripheral neuropathy   LABORATORY DATA:  I have reviewed the data as listed   Chemistry      Component Value Date/Time   NA 136 09/24/2015 1126    NA 141 02/21/2015 1530   K 3.7 09/24/2015 1126   K 4.0 02/21/2015 1530   CL 102 02/21/2015 1530   CL 104 01/23/2013 1420   CO2 26 09/24/2015 1126   CO2 24 02/21/2015 1530   BUN 16.0 09/24/2015 1126   BUN 13 02/21/2015 1530   CREATININE 0.8 09/24/2015 1126   CREATININE 0.54 02/21/2015 1530      Component Value Date/Time   CALCIUM 9.2 09/24/2015 1126   CALCIUM 9.5 02/21/2015 1530   ALKPHOS 61 09/24/2015 1126   ALKPHOS 63 02/07/2012 1312   AST 11 09/24/2015 1126   AST 17 02/07/2012 1312   ALT <9 09/24/2015 1126   ALT 15 02/07/2012 1312   BILITOT 0.37 09/24/2015 1126   BILITOT 0.3 02/07/2012 1312       Lab Results  Component Value Date   WBC 2.5* 09/24/2015   HGB 11.4* 09/24/2015   HCT 35.5 09/24/2015   MCV 83.2 09/24/2015   PLT 267 09/24/2015   NEUTROABS 1.4* 09/24/2015     RADIOGRAPHIC STUDIES: I have personally reviewed the radiology reports and agreed with their findings. Ct Chest W Contrast  09/22/2015  CLINICAL DATA:  Patient with history of left breast cancer diagnosed in 2011. On chemotherapy. EXAM: CT CHEST,  ABDOMEN, AND PELVIS WITH CONTRAST TECHNIQUE: Multidetector CT imaging of the chest, abdomen and pelvis was performed following the standard protocol during bolus administration of intravenous contrast. CONTRAST:  141m OMNIPAQUE IOHEXOL 300 MG/ML  SOLN COMPARISON:  PET-CT 07/18/2015. FINDINGS: CT CHEST FINDINGS Mediastinum/Lymph Nodes: Right anterior chest wall Port-A-Cath is present. Unchanged irregular density within the inferior aspect of the left breast measuring 3.2 cm (image 37; series 2). Normal heart size. No pericardial effusion. When compared to prior contrast-enhanced CT there appears to be new enhancing nodularity involving the prevascular soft tissue with the new enhancing nodule measuring 1.7 x 1.8 cm (image 19; series 2). Overall the prevascular soft tissue is stable to slightly increased when compared to recent PET-CT measuring 2.7 x 4.5 cm (image  19; series 2), previously 2.4 x 4.5 cm. Lungs/Pleura: The central airways are patent. Scattered ground-glass opacities bilaterally. No large area of pulmonary consolidation. No pulmonary nodules. No pleural effusion or pneumothorax. Musculoskeletal: Grossly unchanged irregular area of sclerosis within the left aspect of the manubrium which may represent treated osseous metastasis. CT ABDOMEN PELVIS FINDINGS Hepatobiliary: Liver is normal in size and contour. No focal hepatic is identified. Gallbladder is unremarkable. No intrahepatic or extrahepatic biliary ductal dilatation. Pancreas: Fatty atrophy of the pancreatic parenchyma. Spleen: Unremarkable Adrenals/Urinary Tract: Adrenal glands are normal. The kidneys enhance symmetrically with contrast. No hydronephrosis. Stomach/Bowel: The appendix is normal. No abnormal bowel wall thickening or evidence for bowel obstruction. Vascular/Lymphatic: Normal caliber abdominal aorta. No retroperitoneal lymphadenopathy. Other: The uterus and adnexal structures are unremarkable. Musculoskeletal: Lumbar spine degenerative changes. No aggressive or acute appearing osseous lesions. Bilateral L5 pars defects with grade 1/grade 2 anterolisthesis of L5 on S1. IMPRESSION: Interval development of enhancing nodularity within the prevascular soft tissue, concerning for the possibility of disease progression. Re- demonstrated asymmetric sclerosis within the left aspect of the sternum potentially secondary to treated metastasis or radiation therapy. Electronically Signed   By: DLovey NewcomerM.D.   On: 09/22/2015 17:00   Ct Abdomen Pelvis W Contrast  09/22/2015  CLINICAL DATA:  Patient with history of left breast cancer diagnosed in 2011. On chemotherapy. EXAM: CT CHEST, ABDOMEN, AND PELVIS WITH CONTRAST TECHNIQUE: Multidetector CT imaging of the chest, abdomen and pelvis was performed following the standard protocol during bolus administration of intravenous contrast. CONTRAST:  1025m OMNIPAQUE IOHEXOL 300 MG/ML  SOLN COMPARISON:  PET-CT 07/18/2015. FINDINGS: CT CHEST FINDINGS Mediastinum/Lymph Nodes: Right anterior chest wall Port-A-Cath is present. Unchanged irregular density within the inferior aspect of the left breast measuring 3.2 cm (image 37; series 2). Normal heart size. No pericardial effusion. When compared to prior contrast-enhanced CT there appears to be new enhancing nodularity involving the prevascular soft tissue with the new enhancing nodule measuring 1.7 x 1.8 cm (image 19; series 2). Overall the prevascular soft tissue is stable to slightly increased when compared to recent PET-CT measuring 2.7 x 4.5 cm (image 19; series 2), previously 2.4 x 4.5 cm. Lungs/Pleura: The central airways are patent. Scattered ground-glass opacities bilaterally. No large area of pulmonary consolidation. No pulmonary nodules. No pleural effusion or pneumothorax. Musculoskeletal: Grossly unchanged irregular area of sclerosis within the left aspect of the manubrium which may represent treated osseous metastasis. CT ABDOMEN PELVIS FINDINGS Hepatobiliary: Liver is normal in size and contour. No focal hepatic is identified. Gallbladder is unremarkable. No intrahepatic or extrahepatic biliary ductal dilatation. Pancreas: Fatty atrophy of the pancreatic parenchyma. Spleen: Unremarkable Adrenals/Urinary Tract: Adrenal glands are normal. The kidneys enhance symmetrically with contrast. No  hydronephrosis. Stomach/Bowel: The appendix is normal. No abnormal bowel wall thickening or evidence for bowel obstruction. Vascular/Lymphatic: Normal caliber abdominal aorta. No retroperitoneal lymphadenopathy. Other: The uterus and adnexal structures are unremarkable. Musculoskeletal: Lumbar spine degenerative changes. No aggressive or acute appearing osseous lesions. Bilateral L5 pars defects with grade 1/grade 2 anterolisthesis of L5 on S1. IMPRESSION: Interval development of enhancing nodularity within the prevascular  soft tissue, concerning for the possibility of disease progression. Re- demonstrated asymmetric sclerosis within the left aspect of the sternum potentially secondary to treated metastasis or radiation therapy. Electronically Signed   By: Lovey Newcomer M.D.   On: 09/22/2015 17:00     ASSESSMENT & PLAN:  Breast cancer of lower-outer quadrant of left female breast ([place]) Left breast cancer treated with neoadjuvant antiestrogen therapy followed by lumpectomy 11/10/2010 followed by radiation completed 01/11/2011, antiestrogen therapy with Femara 2.5 mg daily started 02/03/2011, diagnosed with metastatic disease May 2016 PET/CT 03/04/15: Bulky metastatic disease in supraclavicular and Mediastinal LN and 3 hepatic lesions.  Biopsy of the mediastinal LN: Metastatic Breast cancer ER 0%, PR 0%, Ki 67 60%, Her 2 Neg  Current treatment: Halaven started 03/19/15; completed 6 cycles 07/02/15 , now on maintenance HalavenCycle 10 (treatment being changed her days 1 and 8 every 3 weeks because of progression noted on the CT scan 09/14/2015)  CT chest abdomen pelvis 09/22/2015: slight increased nodularity in the prevascular lymph node measuring 2.7 x 4.5 cm previously 2.4 x 4.5 cm, new enhancing nodule 1.7 x 1.8 cm  Chemotherapy-induced neuropathy: Much improved with B-12, magnesium supplementation Chemotherapy-induced pancytopenia: Patient to get Neulasta injection on day 8 of chemotherapy.  Return to clinic in 3 weeks for follow-up.    No orders of the defined types were placed in this encounter.   The patient has a good understanding of the overall plan. she agrees with it. she will call with any problems that may develop before the next visit here.   Rulon Eisenmenger, MD 09/24/2015

## 2015-10-01 ENCOUNTER — Ambulatory Visit (HOSPITAL_BASED_OUTPATIENT_CLINIC_OR_DEPARTMENT_OTHER): Payer: Medicaid Other

## 2015-10-01 ENCOUNTER — Other Ambulatory Visit (HOSPITAL_BASED_OUTPATIENT_CLINIC_OR_DEPARTMENT_OTHER): Payer: Medicaid Other

## 2015-10-01 VITALS — BP 139/74 | HR 84 | Temp 98.4°F | Resp 18

## 2015-10-01 DIAGNOSIS — C50912 Malignant neoplasm of unspecified site of left female breast: Secondary | ICD-10-CM

## 2015-10-01 DIAGNOSIS — Z5111 Encounter for antineoplastic chemotherapy: Secondary | ICD-10-CM

## 2015-10-01 DIAGNOSIS — C50412 Malignant neoplasm of upper-outer quadrant of left female breast: Secondary | ICD-10-CM

## 2015-10-01 DIAGNOSIS — Z5189 Encounter for other specified aftercare: Secondary | ICD-10-CM

## 2015-10-01 DIAGNOSIS — C50512 Malignant neoplasm of lower-outer quadrant of left female breast: Secondary | ICD-10-CM

## 2015-10-01 LAB — COMPREHENSIVE METABOLIC PANEL
ALBUMIN: 3.6 g/dL (ref 3.5–5.0)
ALK PHOS: 56 U/L (ref 40–150)
ALT: 10 U/L (ref 0–55)
ANION GAP: 12 meq/L — AB (ref 3–11)
AST: 13 U/L (ref 5–34)
BILIRUBIN TOTAL: 0.39 mg/dL (ref 0.20–1.20)
BUN: 6.2 mg/dL — ABNORMAL LOW (ref 7.0–26.0)
CO2: 26 meq/L (ref 22–29)
Calcium: 9.5 mg/dL (ref 8.4–10.4)
Chloride: 102 mEq/L (ref 98–109)
Creatinine: 0.7 mg/dL (ref 0.6–1.1)
Glucose: 120 mg/dl (ref 70–140)
Potassium: 3.5 mEq/L (ref 3.5–5.1)
Sodium: 140 mEq/L (ref 136–145)
TOTAL PROTEIN: 6.9 g/dL (ref 6.4–8.3)

## 2015-10-01 LAB — CBC WITH DIFFERENTIAL/PLATELET
BASO%: 1.2 % (ref 0.0–2.0)
BASOS ABS: 0 10*3/uL (ref 0.0–0.1)
EOS ABS: 0 10*3/uL (ref 0.0–0.5)
EOS%: 1 % (ref 0.0–7.0)
HCT: 33.4 % — ABNORMAL LOW (ref 34.8–46.6)
HGB: 10.9 g/dL — ABNORMAL LOW (ref 11.6–15.9)
LYMPH%: 34.2 % (ref 14.0–49.7)
MCH: 27 pg (ref 25.1–34.0)
MCHC: 32.8 g/dL (ref 31.5–36.0)
MCV: 82.4 fL (ref 79.5–101.0)
MONO#: 0.1 10*3/uL (ref 0.1–0.9)
MONO%: 4 % (ref 0.0–14.0)
NEUT%: 59.6 % (ref 38.4–76.8)
NEUTROS ABS: 1.6 10*3/uL (ref 1.5–6.5)
PLATELETS: 238 10*3/uL (ref 145–400)
RBC: 4.05 10*6/uL (ref 3.70–5.45)
RDW: 14.6 % — ABNORMAL HIGH (ref 11.2–14.5)
WBC: 2.7 10*3/uL — AB (ref 3.9–10.3)
lymph#: 0.9 10*3/uL (ref 0.9–3.3)

## 2015-10-01 MED ORDER — PALONOSETRON HCL INJECTION 0.25 MG/5ML
0.2500 mg | Freq: Once | INTRAVENOUS | Status: AC
  Administered 2015-10-01: 0.25 mg via INTRAVENOUS

## 2015-10-01 MED ORDER — SODIUM CHLORIDE 0.9 % IV SOLN
Freq: Once | INTRAVENOUS | Status: AC
  Administered 2015-10-01: 10:00:00 via INTRAVENOUS

## 2015-10-01 MED ORDER — PALONOSETRON HCL INJECTION 0.25 MG/5ML
INTRAVENOUS | Status: AC
  Filled 2015-10-01: qty 5

## 2015-10-01 MED ORDER — SODIUM CHLORIDE 0.9 % IV SOLN
Freq: Once | INTRAVENOUS | Status: AC
  Administered 2015-10-01: 11:00:00 via INTRAVENOUS
  Filled 2015-10-01: qty 1

## 2015-10-01 MED ORDER — SODIUM CHLORIDE 0.9 % IJ SOLN
10.0000 mL | INTRAMUSCULAR | Status: DC | PRN
  Administered 2015-10-01: 10 mL
  Filled 2015-10-01: qty 10

## 2015-10-01 MED ORDER — PEGFILGRASTIM 6 MG/0.6ML ~~LOC~~ PSKT
6.0000 mg | PREFILLED_SYRINGE | Freq: Once | SUBCUTANEOUS | Status: AC
  Administered 2015-10-01: 6 mg via SUBCUTANEOUS
  Filled 2015-10-01: qty 0.6

## 2015-10-01 MED ORDER — ERIBULIN MESYLATE CHEMO INJECTION 1 MG/2ML
1.0000 mg/m2 | Freq: Once | INTRAVENOUS | Status: AC
  Administered 2015-10-01: 1.7 mg via INTRAVENOUS
  Filled 2015-10-01: qty 3.4

## 2015-10-01 MED ORDER — HEPARIN SOD (PORK) LOCK FLUSH 100 UNIT/ML IV SOLN
500.0000 [IU] | Freq: Once | INTRAVENOUS | Status: AC | PRN
  Administered 2015-10-01: 500 [IU]
  Filled 2015-10-01: qty 5

## 2015-10-06 ENCOUNTER — Telehealth: Payer: Self-pay

## 2015-10-06 NOTE — Assessment & Plan Note (Signed)
Left breast cancer treated with neoadjuvant antiestrogen therapy followed by lumpectomy 11/10/2010 followed by radiation completed 01/11/2011, antiestrogen therapy with Femara 2.5 mg daily started 02/03/2011, diagnosed with metastatic disease May 2016 PET/CT 03/04/15: Bulky metastatic disease in supraclavicular and Mediastinal LN and 3 hepatic lesions.  Biopsy of the mediastinal LN: Metastatic Breast cancer ER 0%, PR 0%, Ki 67 60%, Her 2 Neg  Current treatment: Halaven started 03/19/15; completed 6 cycles 07/02/15 , now on maintenance HalavenCycle 10 (treatment being changed her days 1 and 8 every 3 weeks because of progression noted on the CT scan 09/14/2015)  CT chest abdomen pelvis 09/22/2015: slight increased nodularity in the prevascular lymph node measuring 2.7 x 4.5 cm previously 2.4 x 4.5 cm, new enhancing nodule 1.7 x 1.8 cm  Chemotherapy-induced neuropathy: Much improved with B-12, magnesium supplementation Chemotherapy-induced pancytopenia: Patient to get Neulasta injection on day 8 of chemotherapy.  Return to clinic in 3 weeks for follow-up.

## 2015-10-06 NOTE — Telephone Encounter (Signed)
Returned call to son re: difficulty swallowing and eyes "sticky".  He is unable to provide further information.  Appt made to see Dr. Lindi Adie 10/07/15 at 3 pm.  He voiced understanding.

## 2015-10-07 ENCOUNTER — Ambulatory Visit (HOSPITAL_BASED_OUTPATIENT_CLINIC_OR_DEPARTMENT_OTHER): Payer: Medicaid Other | Admitting: Hematology and Oncology

## 2015-10-07 ENCOUNTER — Encounter: Payer: Self-pay | Admitting: Hematology and Oncology

## 2015-10-07 VITALS — BP 125/62 | HR 111 | Temp 97.5°F | Resp 18 | Ht 65.0 in | Wt 146.0 lb

## 2015-10-07 DIAGNOSIS — C7801 Secondary malignant neoplasm of right lung: Secondary | ICD-10-CM

## 2015-10-07 DIAGNOSIS — J039 Acute tonsillitis, unspecified: Secondary | ICD-10-CM

## 2015-10-07 DIAGNOSIS — D61810 Antineoplastic chemotherapy induced pancytopenia: Secondary | ICD-10-CM | POA: Diagnosis not present

## 2015-10-07 DIAGNOSIS — C778 Secondary and unspecified malignant neoplasm of lymph nodes of multiple regions: Secondary | ICD-10-CM

## 2015-10-07 DIAGNOSIS — G620 Drug-induced polyneuropathy: Secondary | ICD-10-CM | POA: Diagnosis not present

## 2015-10-07 DIAGNOSIS — J038 Acute tonsillitis due to other specified organisms: Secondary | ICD-10-CM | POA: Diagnosis not present

## 2015-10-07 DIAGNOSIS — C50512 Malignant neoplasm of lower-outer quadrant of left female breast: Secondary | ICD-10-CM | POA: Diagnosis not present

## 2015-10-07 DIAGNOSIS — C787 Secondary malignant neoplasm of liver and intrahepatic bile duct: Secondary | ICD-10-CM

## 2015-10-07 DIAGNOSIS — K769 Liver disease, unspecified: Secondary | ICD-10-CM

## 2015-10-07 DIAGNOSIS — T451X5A Adverse effect of antineoplastic and immunosuppressive drugs, initial encounter: Secondary | ICD-10-CM

## 2015-10-07 MED ORDER — LEVOFLOXACIN 500 MG PO TABS: 500.0000 mg | ORAL_TABLET | Freq: Every day | ORAL | Status: DC

## 2015-10-07 NOTE — Progress Notes (Signed)
Patient Care Team: Tresa Garter, MD as PCP - General (Internal Medicine)  DIAGNOSIS: No matching staging information was found for the patient.  SUMMARY OF ONCOLOGIC HISTORY:   Breast cancer of lower-outer quadrant of left female breast ([place])   04/06/2010 - 10/26/2010 Anti-estrogen oral therapy Neoadjuvant antiestrogen therapy   11/10/2010 Surgery Left breast lumpectomy: Invasive ductal carcinoma 2 cm, grade 3, angiolymphatic invasion present, 6 lymph nodes negative, ER 99%, PR 90%, Ki-67 20%, HER-2 negative ratio 1.22   12/01/2010 - 01/11/2011 Radiation Therapy Adjuvant radiation therapy   02/03/2011 -  Anti-estrogen oral therapy Femara 2.5 mg daily   02/22/2015 Imaging CT chest revealed 6 cm mediastinal mass, right hilar lymph nodes, base of neck lesions in both right and left, subtle liver metastases, periportal lymph nodes   02/28/2015 PET scan Bulky bilateral hypermetabolic supraclavicular and mediastinal lymphadenopathy consistent with eoplasm/metastatic disease. Three hepatic metastatic lesions   03/04/2015 Initial Biopsy Mediastinal mass Biopsy: Invasive Mammary cancer ER Neg   03/19/2015 -  Chemotherapy Halaven day 1 and day 8 every 3 weeks    CHIEF COMPLIANT: severe sore throat and fullness in the ear  INTERVAL HISTORY: Michele Hale is a 69 year old lady with above-mentioned history metastatic breast cancer currently on palliative chemotherapy with Halaven. She came in today for an urgent visit because of severe sore throat and a feeling of fullness in the ears. She denies any fevers or chills. She does have a cough. It is not associated with any sore throat or acid reflux. She also complained of whitish thick secretions with her eyes. They're not red or inflamed.overall her energy levels are excellent.  REVIEW OF SYSTEMS:   Constitutional: Denies fevers, chills or abnormal weight loss Eyes: conjunctival irritation Ears, nose, mouth, throat, and face: Denies mucositis or sore  throat Respiratory: Denies cough, dyspnea or wheezes Cardiovascular: Denies palpitation, chest discomfort or lower extremity swelling Gastrointestinal:  Denies nausea, heartburn or change in bowel habits Skin: Denies abnormal skin rashes Lymphatics: Denies new lymphadenopathy or easy bruising Neurological:neuropathy grade 1 Behavioral/Psych: Mood is stable, no new changes   All other systems were reviewed with the patient and are negative.  I have reviewed the past medical history, past surgical history, social history and family history with the patient and they are unchanged from previous note.  ALLERGIES:  has No Known Allergies.  MEDICATIONS:  Current Outpatient Prescriptions  Medication Sig Dispense Refill  . acetaminophen (TYLENOL) 500 MG tablet Take 1,000 mg by mouth every 6 (six) hours as needed. For arthritis pain    . calcium-vitamin D (OSCAL WITH D) 500-200 MG-UNIT per tablet Take 1 tablet by mouth 2 (two) times daily.    . cholecalciferol (VITAMIN D) 1000 UNITS tablet Take 1 tablet (1,000 Units total) by mouth daily. 30 tablet 12  . dexamethasone (DECADRON) 1 MG tablet Take 0.5 tablets (0.5 mg total) by mouth daily. 30 tablet 1  . fluconazole (DIFLUCAN) 100 MG tablet Take 1 tablet (100 mg total) by mouth daily. 7 tablet 0  . HYDROcodone-acetaminophen (NORCO/VICODIN) 5-325 MG per tablet Take 1-2 tablets by mouth every 4 (four) hours as needed. (Patient taking differently: Take 1-2 tablets by mouth every 4 (four) hours as needed for moderate pain. ) 10 tablet 0  . ibuprofen (ADVIL,MOTRIN) 800 MG tablet Take 1 tablet (800 mg total) by mouth every 8 (eight) hours as needed. 30 tablet 3  . levofloxacin (LEVAQUIN) 500 MG tablet Take 1 tablet (500 mg total) by mouth daily. 7 tablet 0  .  lidocaine-prilocaine (EMLA) cream Apply to port-a-cath one and a half hours before use.  Cover cream with plastic wrap to seal. 30 g 3  . loratadine (CLARITIN) 10 MG tablet Take 1 tablet (10 mg total)  by mouth daily. 30 tablet 3  . magnesium oxide (MAG-OX) 400 (241.3 MG) MG tablet Take 1 tablet (400 mg total) by mouth daily. 30 tablet 3  . ondansetron (ZOFRAN) 8 MG tablet Take 1 tablet (8 mg total) by mouth 2 (two) times daily. Start the day after chemo for 2 days. Then take as needed for nausea or vomiting. 30 tablet 1  . promethazine (PHENERGAN) 25 MG tablet Take 1 tablet (25 mg total) by mouth every 6 (six) hours as needed for nausea or vomiting. 30 tablet 6   No current facility-administered medications for this visit.   Facility-Administered Medications Ordered in Other Visits  Medication Dose Route Frequency Provider Last Rate Last Dose  . sodium chloride 0.9 % injection 10 mL  10 mL Intracatheter PRN Nicholas Lose, MD   10 mL at 07/22/15 1643    PHYSICAL EXAMINATION: ECOG PERFORMANCE STATUS: 1 - Symptomatic but completely ambulatory  Filed Vitals:   10/07/15 1519  BP: 125/62  Pulse: 111  Temp: 97.5 F (36.4 C)  Resp: 18   Filed Weights   10/07/15 1519  Weight: 146 lb (66.225 kg)    GENERAL:alert, no distress and comfortable SKIN: skin color, texture, turgor are normal, no rashes or significant lesions EYES: normal, Conjunctiva are pink and non-injected, sclera clear OROPHARYNX:no exudate, no erythema and lips, buccal mucosa, and tongue normal  NECK: supple, thyroid normal size, non-tender, without nodularity LYMPH:  no palpable lymphadenopathy in the cervical, axillary or inguinal LUNGS: clear to auscultation and percussion with normal breathing effort HEART: regular rate & rhythm and no murmurs and no lower extremity edema ABDOMEN:abdomen soft, non-tender and normal bowel sounds Musculoskeletal:no cyanosis of digits and no clubbing  NEURO: alert & oriented x 3 with fluent speech, grade 1 neuropathy  LABORATORY DATA:  I have reviewed the data as listed   Chemistry      Component Value Date/Time   NA 140 10/01/2015 0935   NA 141 02/21/2015 1530   K 3.5  10/01/2015 0935   K 4.0 02/21/2015 1530   CL 102 02/21/2015 1530   CL 104 01/23/2013 1420   CO2 26 10/01/2015 0935   CO2 24 02/21/2015 1530   BUN 6.2* 10/01/2015 0935   BUN 13 02/21/2015 1530   CREATININE 0.7 10/01/2015 0935   CREATININE 0.54 02/21/2015 1530      Component Value Date/Time   CALCIUM 9.5 10/01/2015 0935   CALCIUM 9.5 02/21/2015 1530   ALKPHOS 56 10/01/2015 0935   ALKPHOS 63 02/07/2012 1312   AST 13 10/01/2015 0935   AST 17 02/07/2012 1312   ALT 10 10/01/2015 0935   ALT 15 02/07/2012 1312   BILITOT 0.39 10/01/2015 0935   BILITOT 0.3 02/07/2012 1312       Lab Results  Component Value Date   WBC 2.7* 10/01/2015   HGB 10.9* 10/01/2015   HCT 33.4* 10/01/2015   MCV 82.4 10/01/2015   PLT 238 10/01/2015   NEUTROABS 1.6 10/01/2015   ASSESSMENT & PLAN:  Breast cancer of lower-outer quadrant of left female breast ([place]) Left breast cancer treated with neoadjuvant antiestrogen therapy followed by lumpectomy 11/10/2010 followed by radiation completed 01/11/2011, antiestrogen therapy with Femara 2.5 mg daily started 02/03/2011, diagnosed with metastatic disease May 2016 PET/CT 03/04/15: Bulky metastatic  disease in supraclavicular and Mediastinal LN and 3 hepatic lesions.  Biopsy of the mediastinal LN: Metastatic Breast cancer ER 0%, PR 0%, Ki 67 60%, Her 2 Neg  Current treatment: Halaven started 03/19/15; completed 6 cycles 07/02/15 , now on maintenance HalavenCycle 10 (treatment being changed her days 1 and 8 every 3 weeks because of progression noted on the CT scan 09/14/2015)  CT chest abdomen pelvis 09/22/2015: slight increased nodularity in the prevascular lymph node measuring 2.7 x 4.5 cm previously 2.4 x 4.5 cm, new enhancing nodule 1.7 x 1.8 cm  Chemotherapy toxicities: 1. Chemotherapy-induced neuropathy: Much improved with B-12, magnesium supplementation 2. Chemotherapy-induced pancytopenia: Patient to get Neulasta injection on day 8 of chemotherapy.  3.  Tonsillitis/pharyngitis: Prescribed Levaquin for 1 week 4. Conjunctival irritation: Recommended washing ice with cold water. She will be on Levaquin which would also help her conjunctival inflammation.  Return to clinic with next cycle to assess improvement in her symptoms..   No orders of the defined types were placed in this encounter.   The patient has a good understanding of the overall plan. she agrees with it. she will call with any problems that may develop before the next visit here.   Rulon Eisenmenger, MD 10/07/2015

## 2015-10-14 NOTE — Assessment & Plan Note (Signed)
Left breast cancer treated with neoadjuvant antiestrogen therapy followed by lumpectomy 11/10/2010 followed by radiation completed 01/11/2011, antiestrogen therapy with Femara 2.5 mg daily started 02/03/2011, diagnosed with metastatic disease May 2016 PET/CT 03/04/15: Bulky metastatic disease in supraclavicular and Mediastinal LN and 3 hepatic lesions.  Biopsy of the mediastinal LN: Metastatic Breast cancer ER 0%, PR 0%, Ki 67 60%, Her 2 Neg  Current treatment: Halaven started 03/19/15; completed 6 cycles 07/02/15 , now on maintenance HalavenCycle 11 (treatment being changed her days 1 and 8 every 3 weeks because of progression noted on the CT scan 09/14/2015)  CT chest abdomen pelvis 09/22/2015: slight increased nodularity in the prevascular lymph node measuring 2.7 x 4.5 cm previously 2.4 x 4.5 cm, new enhancing nodule 1.7 x 1.8 cm  Chemotherapy toxicities: 1. Chemotherapy-induced neuropathy: Much improved with B-12, magnesium supplementation 2. Chemotherapy-induced pancytopenia: Patient to get Neulasta injection on day 8 of chemotherapy.  3. Tonsillitis/pharyngitis: Prescribed Levaquin for 1 week 4. Conjunctival irritation: Recommended washing ice with cold water. She will be on Levaquin which would also help her conjunctival inflammation.  Return to clinic with next cycle.

## 2015-10-15 ENCOUNTER — Ambulatory Visit (HOSPITAL_BASED_OUTPATIENT_CLINIC_OR_DEPARTMENT_OTHER): Payer: Medicaid Other

## 2015-10-15 ENCOUNTER — Ambulatory Visit (HOSPITAL_BASED_OUTPATIENT_CLINIC_OR_DEPARTMENT_OTHER): Payer: Medicaid Other | Admitting: Hematology and Oncology

## 2015-10-15 ENCOUNTER — Telehealth: Payer: Self-pay | Admitting: Hematology and Oncology

## 2015-10-15 ENCOUNTER — Other Ambulatory Visit (HOSPITAL_BASED_OUTPATIENT_CLINIC_OR_DEPARTMENT_OTHER): Payer: Medicaid Other

## 2015-10-15 VITALS — BP 127/60 | HR 101 | Temp 98.2°F | Resp 18 | Ht 65.0 in | Wt 146.6 lb

## 2015-10-15 DIAGNOSIS — D61810 Antineoplastic chemotherapy induced pancytopenia: Secondary | ICD-10-CM

## 2015-10-15 DIAGNOSIS — C50912 Malignant neoplasm of unspecified site of left female breast: Secondary | ICD-10-CM

## 2015-10-15 DIAGNOSIS — C778 Secondary and unspecified malignant neoplasm of lymph nodes of multiple regions: Secondary | ICD-10-CM

## 2015-10-15 DIAGNOSIS — Z5111 Encounter for antineoplastic chemotherapy: Secondary | ICD-10-CM

## 2015-10-15 DIAGNOSIS — C50512 Malignant neoplasm of lower-outer quadrant of left female breast: Secondary | ICD-10-CM

## 2015-10-15 DIAGNOSIS — C7801 Secondary malignant neoplasm of right lung: Secondary | ICD-10-CM

## 2015-10-15 DIAGNOSIS — H109 Unspecified conjunctivitis: Secondary | ICD-10-CM | POA: Insufficient documentation

## 2015-10-15 DIAGNOSIS — C50412 Malignant neoplasm of upper-outer quadrant of left female breast: Secondary | ICD-10-CM

## 2015-10-15 DIAGNOSIS — C787 Secondary malignant neoplasm of liver and intrahepatic bile duct: Secondary | ICD-10-CM

## 2015-10-15 DIAGNOSIS — T451X5A Adverse effect of antineoplastic and immunosuppressive drugs, initial encounter: Secondary | ICD-10-CM

## 2015-10-15 LAB — CBC WITH DIFFERENTIAL/PLATELET
BASO%: 0.5 % (ref 0.0–2.0)
Basophils Absolute: 0 10*3/uL (ref 0.0–0.1)
EOS ABS: 0 10*3/uL (ref 0.0–0.5)
EOS%: 0.1 % (ref 0.0–7.0)
HCT: 34.2 % — ABNORMAL LOW (ref 34.8–46.6)
HEMOGLOBIN: 11.1 g/dL — AB (ref 11.6–15.9)
LYMPH%: 25 % (ref 14.0–49.7)
MCH: 27.2 pg (ref 25.1–34.0)
MCHC: 32.5 g/dL (ref 31.5–36.0)
MCV: 83.6 fL (ref 79.5–101.0)
MONO#: 0.5 10*3/uL (ref 0.1–0.9)
MONO%: 7.8 % (ref 0.0–14.0)
NEUT%: 66.6 % (ref 38.4–76.8)
NEUTROS ABS: 3.9 10*3/uL (ref 1.5–6.5)
Platelets: 189 10*3/uL (ref 145–400)
RBC: 4.09 10*6/uL (ref 3.70–5.45)
RDW: 15.2 % — AB (ref 11.2–14.5)
WBC: 5.9 10*3/uL (ref 3.9–10.3)
lymph#: 1.5 10*3/uL (ref 0.9–3.3)

## 2015-10-15 LAB — COMPREHENSIVE METABOLIC PANEL
ALBUMIN: 3.4 g/dL — AB (ref 3.5–5.0)
ALT: 10 U/L (ref 0–55)
AST: 14 U/L (ref 5–34)
Alkaline Phosphatase: 88 U/L (ref 40–150)
Anion Gap: 9 mEq/L (ref 3–11)
BUN: 10.1 mg/dL (ref 7.0–26.0)
CO2: 25 mEq/L (ref 22–29)
Calcium: 9.5 mg/dL (ref 8.4–10.4)
Chloride: 104 mEq/L (ref 98–109)
Creatinine: 0.7 mg/dL (ref 0.6–1.1)
EGFR: >90 mL/min/{1.73_m2} (ref >90)
GLUCOSE: 141 mg/dL — AB (ref 70–140)
Potassium: 3.8 mEq/L (ref 3.5–5.1)
SODIUM: 139 meq/L (ref 136–145)
TOTAL PROTEIN: 7 g/dL (ref 6.4–8.3)

## 2015-10-15 MED ORDER — PALONOSETRON HCL INJECTION 0.25 MG/5ML
INTRAVENOUS | Status: AC
  Filled 2015-10-15: qty 5

## 2015-10-15 MED ORDER — CIPROFLOXACIN HCL 0.3 % OP SOLN: 1.0000 [drp] | Freq: Three times a day (TID) | OPHTHALMIC | Status: DC

## 2015-10-15 MED ORDER — SODIUM CHLORIDE 0.9 % IV SOLN
Freq: Once | INTRAVENOUS | Status: AC
  Administered 2015-10-15: 16:00:00 via INTRAVENOUS
  Filled 2015-10-15: qty 1

## 2015-10-15 MED ORDER — HEPARIN SOD (PORK) LOCK FLUSH 100 UNIT/ML IV SOLN
500.0000 [IU] | Freq: Once | INTRAVENOUS | Status: AC | PRN
  Administered 2015-10-15: 500 [IU]
  Filled 2015-10-15: qty 5

## 2015-10-15 MED ORDER — PALONOSETRON HCL INJECTION 0.25 MG/5ML
0.2500 mg | Freq: Once | INTRAVENOUS | Status: AC
  Administered 2015-10-15: 0.25 mg via INTRAVENOUS

## 2015-10-15 MED ORDER — SODIUM CHLORIDE 0.9 % IV SOLN
1.0000 mg/m2 | Freq: Once | INTRAVENOUS | Status: AC
  Administered 2015-10-15: 1.7 mg via INTRAVENOUS
  Filled 2015-10-15: qty 3.4

## 2015-10-15 MED ORDER — SODIUM CHLORIDE 0.9 % IJ SOLN
10.0000 mL | INTRAMUSCULAR | Status: DC | PRN
  Administered 2015-10-15: 10 mL
  Filled 2015-10-15: qty 10

## 2015-10-15 MED ORDER — MAGNESIUM OXIDE 400 (241.3 MG) MG PO TABS: 400.0000 mg | ORAL_TABLET | Freq: Every day | ORAL | Status: AC

## 2015-10-15 MED ORDER — SODIUM CHLORIDE 0.9 % IV SOLN
Freq: Once | INTRAVENOUS | Status: AC
  Administered 2015-10-15: 16:00:00 via INTRAVENOUS

## 2015-10-15 NOTE — Progress Notes (Signed)
Patient Care Team: Tresa Garter, MD as PCP - General (Internal Medicine)  SUMMARY OF ONCOLOGIC HISTORY:   Breast cancer of lower-outer quadrant of left female breast ([place])   04/06/2010 - 10/26/2010 Anti-estrogen oral therapy Neoadjuvant antiestrogen therapy   11/10/2010 Surgery Left breast lumpectomy: Invasive ductal carcinoma 2 cm, grade 3, angiolymphatic invasion present, 6 lymph nodes negative, ER 99%, PR 90%, Ki-67 20%, HER-2 negative ratio 1.22   12/01/2010 - 01/11/2011 Radiation Therapy Adjuvant radiation therapy   02/03/2011 -  Anti-estrogen oral therapy Femara 2.5 mg daily   02/22/2015 Imaging CT chest revealed 6 cm mediastinal mass, right hilar lymph nodes, base of neck lesions in both right and left, subtle liver metastases, periportal lymph nodes   02/28/2015 PET scan Bulky bilateral hypermetabolic supraclavicular and mediastinal lymphadenopathy consistent with eoplasm/metastatic disease. Three hepatic metastatic lesions   03/04/2015 Initial Biopsy Mediastinal mass Biopsy: Invasive Mammary cancer ER Neg   03/19/2015 -  Chemotherapy Halaven day 1 and day 8 every 3 weeks    CHIEF COMPLIANT: follow-up on Halaven cycle 11  INTERVAL HISTORY: Tu Stoke is a 69 year old with above-mentioned history of metastatic breast cancer was currently at [facility]. She is tolerating the chemotherapy extremely well. She had upper respiratory infection which was treated with antibiotics which resolved her symptoms. She is complaining of bilateral eye itching and whitish discharge. Denies any nausea or vomiting but denies any neuropathy.  REVIEW OF SYSTEMS:   Constitutional: Denies fevers, chills or abnormal weight loss Eyes: white discharge and discomfort Ears, nose, mouth, throat, and face: Denies mucositis or sore throat Respiratory: Denies cough, dyspnea or wheezes Cardiovascular: Denies palpitation, chest discomfort or lower extremity swelling Gastrointestinal:  Denies nausea, heartburn or  change in bowel habits Skin: Denies abnormal skin rashes Lymphatics: Denies new lymphadenopathy or easy bruising Neurological:Denies numbness, tingling or new weaknesses Behavioral/Psych: Mood is stable, no new changes  All other systems were reviewed with the patient and are negative.  I have reviewed the past medical history, past surgical history, social history and family history with the patient and they are unchanged from previous note.  ALLERGIES:  has No Known Allergies.  MEDICATIONS:  Current Outpatient Prescriptions  Medication Sig Dispense Refill  . acetaminophen (TYLENOL) 500 MG tablet Take 1,000 mg by mouth every 6 (six) hours as needed. For arthritis pain    . calcium-vitamin D (OSCAL WITH D) 500-200 MG-UNIT per tablet Take 1 tablet by mouth 2 (two) times daily.    . cholecalciferol (VITAMIN D) 1000 UNITS tablet Take 1 tablet (1,000 Units total) by mouth daily. 30 tablet 12  . dexamethasone (DECADRON) 1 MG tablet Take 0.5 tablets (0.5 mg total) by mouth daily. 30 tablet 1  . fluconazole (DIFLUCAN) 100 MG tablet Take 1 tablet (100 mg total) by mouth daily. 7 tablet 0  . HYDROcodone-acetaminophen (NORCO/VICODIN) 5-325 MG per tablet Take 1-2 tablets by mouth every 4 (four) hours as needed. (Patient taking differently: Take 1-2 tablets by mouth every 4 (four) hours as needed for moderate pain. ) 10 tablet 0  . ibuprofen (ADVIL,MOTRIN) 800 MG tablet Take 1 tablet (800 mg total) by mouth every 8 (eight) hours as needed. 30 tablet 3  . levofloxacin (LEVAQUIN) 500 MG tablet Take 1 tablet (500 mg total) by mouth daily. 7 tablet 0  . lidocaine-prilocaine (EMLA) cream Apply to port-a-cath one and a half hours before use.  Cover cream with plastic wrap to seal. 30 g 3  . loratadine (CLARITIN) 10 MG tablet Take 1  tablet (10 mg total) by mouth daily. 30 tablet 3  . magnesium oxide (MAG-OX) 400 (241.3 MG) MG tablet Take 1 tablet (400 mg total) by mouth daily. 30 tablet 3  . ondansetron  (ZOFRAN) 8 MG tablet Take 1 tablet (8 mg total) by mouth 2 (two) times daily. Start the day after chemo for 2 days. Then take as needed for nausea or vomiting. 30 tablet 1  . promethazine (PHENERGAN) 25 MG tablet Take 1 tablet (25 mg total) by mouth every 6 (six) hours as needed for nausea or vomiting. 30 tablet 6   No current facility-administered medications for this visit.   Facility-Administered Medications Ordered in Other Visits  Medication Dose Route Frequency Provider Last Rate Last Dose  . sodium chloride 0.9 % injection 10 mL  10 mL Intracatheter PRN Nicholas Lose, MD   10 mL at 07/22/15 1643    PHYSICAL EXAMINATION: ECOG PERFORMANCE STATUS: 1 - Symptomatic but completely ambulatory  Filed Vitals:   10/15/15 1427  BP: 127/60  Pulse: 101  Temp: 98.2 F (36.8 C)  Resp: 18   Filed Weights   10/15/15 1427  Weight: 146 lb 9.6 oz (66.497 kg)    GENERAL:alert, no distress and comfortable SKIN: skin color, texture, turgor are normal, no rashes or significant lesions EYES: normal, Conjunctiva are pink and non-injected, sclera clear OROPHARYNX:no exudate, no erythema and lips, buccal mucosa, and tongue normal  NECK: supple, thyroid normal size, non-tender, without nodularity LYMPH:  no palpable lymphadenopathy in the cervical, axillary or inguinal LUNGS: clear to auscultation and percussion with normal breathing effort HEART: regular rate & rhythm and no murmurs and no lower extremity edema ABDOMEN:abdomen soft, non-tender and normal bowel sounds Musculoskeletal:no cyanosis of digits and no clubbing  NEURO: alert & oriented x 3 with fluent speech, no focal motor/sensory deficits  LABORATORY DATA:  I have reviewed the data as listed   Chemistry      Component Value Date/Time   NA 140 10/01/2015 0935   NA 141 02/21/2015 1530   K 3.5 10/01/2015 0935   K 4.0 02/21/2015 1530   CL 102 02/21/2015 1530   CL 104 01/23/2013 1420   CO2 26 10/01/2015 0935   CO2 24 02/21/2015 1530    BUN 6.2* 10/01/2015 0935   BUN 13 02/21/2015 1530   CREATININE 0.7 10/01/2015 0935   CREATININE 0.54 02/21/2015 1530      Component Value Date/Time   CALCIUM 9.5 10/01/2015 0935   CALCIUM 9.5 02/21/2015 1530   ALKPHOS 56 10/01/2015 0935   ALKPHOS 63 02/07/2012 1312   AST 13 10/01/2015 0935   AST 17 02/07/2012 1312   ALT 10 10/01/2015 0935   ALT 15 02/07/2012 1312   BILITOT 0.39 10/01/2015 0935   BILITOT 0.3 02/07/2012 1312       Lab Results  Component Value Date   WBC 5.9 10/15/2015   HGB 11.1* 10/15/2015   HCT 34.2* 10/15/2015   MCV 83.6 10/15/2015   PLT 189 10/15/2015   NEUTROABS 3.9 10/15/2015   ASSESSMENT & PLAN:  Breast cancer of lower-outer quadrant of left female breast ([place]) Left breast cancer treated with neoadjuvant antiestrogen therapy followed by lumpectomy 11/10/2010 followed by radiation completed 01/11/2011, antiestrogen therapy with Femara 2.5 mg daily started 02/03/2011, diagnosed with metastatic disease May 2016 PET/CT 03/04/15: Bulky metastatic disease in supraclavicular and Mediastinal LN and 3 hepatic lesions.  Biopsy of the mediastinal LN: Metastatic Breast cancer ER 0%, PR 0%, Ki 67 60%, Her 2 Neg  Current treatment: Halaven started 03/19/15; completed 6 cycles 07/02/15 , now on maintenance HalavenCycle 11 (treatment being changed her days 1 and 8 every 3 weeks because of progression noted on the CT scan 09/14/2015)  CT chest abdomen pelvis 09/22/2015: slight increased nodularity in the prevascular lymph node measuring 2.7 x 4.5 cm previously 2.4 x 4.5 cm, new enhancing nodule 1.7 x 1.8 cm  Chemotherapy toxicities: 1. Chemotherapy-induced neuropathy: Much improved with B-12, magnesium supplementation 2. Chemotherapy-induced pancytopenia: Patient to get Neulasta injection on day 8 of chemotherapy.  3. Tonsillitis/pharyngitis: improved after Levaquin for 1 week 4. Conjunctival irritation: sent a prescription for ciprofloxacin eyedrops I also renewed  her magnesium supplementation  Return to clinic with next cycle.    No orders of the defined types were placed in this encounter.   The patient has a good understanding of the overall plan. she agrees with it. she will call with any problems that may develop before the next visit here.   Rulon Eisenmenger, MD 10/15/2015

## 2015-10-15 NOTE — Patient Instructions (Signed)
[facility] Discharge Instructions for Patients Receiving Chemotherapy  Today you received the following chemotherapy agents Halaven.  To help prevent nausea and vomiting after your treatment, we encourage you to take your nausea medication.   If you develop nausea and vomiting that is not controlled by your nausea medication, call the clinic.   BELOW ARE SYMPTOMS THAT SHOULD BE REPORTED IMMEDIATELY:  *FEVER GREATER THAN 100.5 F  *CHILLS WITH OR WITHOUT FEVER  NAUSEA AND VOMITING THAT IS NOT CONTROLLED WITH YOUR NAUSEA MEDICATION  *UNUSUAL SHORTNESS OF BREATH  *UNUSUAL BRUISING OR BLEEDING  TENDERNESS IN MOUTH AND THROAT WITH OR WITHOUT PRESENCE OF ULCERS  *URINARY PROBLEMS  *BOWEL PROBLEMS  UNUSUAL RASH Items with * indicate a potential emergency and should be followed up as soon as possible.  Feel free to call the clinic you have any questions or concerns. The clinic phone number is [phone].  Please show the CHEMO ALERT CARD at check-in to the Emergency Department and triage nurse.   

## 2015-10-15 NOTE — Telephone Encounter (Signed)
Appointments made and avs printed for patient °

## 2015-10-22 ENCOUNTER — Ambulatory Visit (HOSPITAL_BASED_OUTPATIENT_CLINIC_OR_DEPARTMENT_OTHER): Payer: Medicaid Other

## 2015-10-22 ENCOUNTER — Other Ambulatory Visit (HOSPITAL_BASED_OUTPATIENT_CLINIC_OR_DEPARTMENT_OTHER): Payer: Medicaid Other

## 2015-10-22 VITALS — BP 131/64 | HR 85 | Temp 98.1°F | Resp 18

## 2015-10-22 DIAGNOSIS — C50912 Malignant neoplasm of unspecified site of left female breast: Secondary | ICD-10-CM

## 2015-10-22 DIAGNOSIS — C50512 Malignant neoplasm of lower-outer quadrant of left female breast: Secondary | ICD-10-CM

## 2015-10-22 DIAGNOSIS — Z5189 Encounter for other specified aftercare: Secondary | ICD-10-CM | POA: Diagnosis not present

## 2015-10-22 DIAGNOSIS — C778 Secondary and unspecified malignant neoplasm of lymph nodes of multiple regions: Secondary | ICD-10-CM | POA: Diagnosis not present

## 2015-10-22 DIAGNOSIS — Z5111 Encounter for antineoplastic chemotherapy: Secondary | ICD-10-CM | POA: Diagnosis not present

## 2015-10-22 DIAGNOSIS — C50412 Malignant neoplasm of upper-outer quadrant of left female breast: Secondary | ICD-10-CM

## 2015-10-22 LAB — CBC WITH DIFFERENTIAL/PLATELET
BASO%: 0.8 % (ref 0.0–2.0)
BASOS ABS: 0 10*3/uL (ref 0.0–0.1)
EOS ABS: 0 10*3/uL (ref 0.0–0.5)
EOS%: 0.1 % (ref 0.0–7.0)
HEMATOCRIT: 32.3 % — AB (ref 34.8–46.6)
HEMOGLOBIN: 10.5 g/dL — AB (ref 11.6–15.9)
LYMPH#: 1.3 10*3/uL (ref 0.9–3.3)
LYMPH%: 43.1 % (ref 14.0–49.7)
MCH: 27.1 pg (ref 25.1–34.0)
MCHC: 32.5 g/dL (ref 31.5–36.0)
MCV: 83.6 fL (ref 79.5–101.0)
MONO#: 0.2 10*3/uL (ref 0.1–0.9)
MONO%: 6.7 % (ref 0.0–14.0)
NEUT#: 1.5 10*3/uL (ref 1.5–6.5)
NEUT%: 49.3 % (ref 38.4–76.8)
PLATELETS: 228 10*3/uL (ref 145–400)
RBC: 3.86 10*6/uL (ref 3.70–5.45)
RDW: 14.3 % (ref 11.2–14.5)
WBC: 3 10*3/uL — ABNORMAL LOW (ref 3.9–10.3)

## 2015-10-22 LAB — COMPREHENSIVE METABOLIC PANEL
ALBUMIN: 3.5 g/dL (ref 3.5–5.0)
ALK PHOS: 67 U/L (ref 40–150)
ALT: 16 U/L (ref 0–55)
ANION GAP: 10 meq/L (ref 3–11)
AST: 18 U/L (ref 5–34)
BUN: 10.2 mg/dL (ref 7.0–26.0)
CALCIUM: 9.1 mg/dL (ref 8.4–10.4)
CO2: 25 mEq/L (ref 22–29)
Chloride: 104 mEq/L (ref 98–109)
Creatinine: 0.7 mg/dL (ref 0.6–1.1)
Glucose: 184 mg/dl — ABNORMAL HIGH (ref 70–140)
POTASSIUM: 3.4 meq/L — AB (ref 3.5–5.1)
Sodium: 139 mEq/L (ref 136–145)
Total Bilirubin: 0.33 mg/dL (ref 0.20–1.20)
Total Protein: 6.8 g/dL (ref 6.4–8.3)

## 2015-10-22 MED ORDER — HEPARIN SOD (PORK) LOCK FLUSH 100 UNIT/ML IV SOLN
500.0000 [IU] | Freq: Once | INTRAVENOUS | Status: AC | PRN
  Administered 2015-10-22: 500 [IU]
  Filled 2015-10-22: qty 5

## 2015-10-22 MED ORDER — SODIUM CHLORIDE 0.9 % IV SOLN
Freq: Once | INTRAVENOUS | Status: AC
  Administered 2015-10-22: 12:00:00 via INTRAVENOUS
  Filled 2015-10-22: qty 1

## 2015-10-22 MED ORDER — SODIUM CHLORIDE 0.9 % IJ SOLN
10.0000 mL | INTRAMUSCULAR | Status: DC | PRN
  Administered 2015-10-22: 10 mL
  Filled 2015-10-22: qty 10

## 2015-10-22 MED ORDER — PALONOSETRON HCL INJECTION 0.25 MG/5ML
0.2500 mg | Freq: Once | INTRAVENOUS | Status: AC
  Administered 2015-10-22: 0.25 mg via INTRAVENOUS

## 2015-10-22 MED ORDER — PEGFILGRASTIM 6 MG/0.6ML ~~LOC~~ PSKT
6.0000 mg | PREFILLED_SYRINGE | Freq: Once | SUBCUTANEOUS | Status: AC
  Administered 2015-10-22: 6 mg via SUBCUTANEOUS
  Filled 2015-10-22: qty 0.6

## 2015-10-22 MED ORDER — SODIUM CHLORIDE 0.9 % IV SOLN
1.0000 mg/m2 | Freq: Once | INTRAVENOUS | Status: AC
  Administered 2015-10-22: 1.7 mg via INTRAVENOUS
  Filled 2015-10-22: qty 3.4

## 2015-10-22 MED ORDER — PALONOSETRON HCL INJECTION 0.25 MG/5ML
INTRAVENOUS | Status: AC
  Filled 2015-10-22: qty 5

## 2015-10-22 MED ORDER — SODIUM CHLORIDE 0.9 % IV SOLN
Freq: Once | INTRAVENOUS | Status: AC
  Administered 2015-10-22: 12:00:00 via INTRAVENOUS

## 2015-10-22 NOTE — Patient Instructions (Signed)
[facility] Discharge Instructions for Patients Receiving Chemotherapy  Today you received the following chemotherapy agents: Halaven  To help prevent nausea and vomiting after your treatment, we encourage you to take your nausea medication as directed.    If you develop nausea and vomiting that is not controlled by your nausea medication, call the clinic.   BELOW ARE SYMPTOMS THAT SHOULD BE REPORTED IMMEDIATELY:  *FEVER GREATER THAN 100.5 F  *CHILLS WITH OR WITHOUT FEVER  NAUSEA AND VOMITING THAT IS NOT CONTROLLED WITH YOUR NAUSEA MEDICATION  *UNUSUAL SHORTNESS OF BREATH  *UNUSUAL BRUISING OR BLEEDING  TENDERNESS IN MOUTH AND THROAT WITH OR WITHOUT PRESENCE OF ULCERS  *URINARY PROBLEMS  *BOWEL PROBLEMS  UNUSUAL RASH Items with * indicate a potential emergency and should be followed up as soon as possible.  Feel free to call the clinic you have any questions or concerns. The clinic phone number is [phone].  Please show the CHEMO ALERT CARD at check-in to the Emergency Department and triage nurse.   

## 2015-11-04 ENCOUNTER — Encounter: Payer: Self-pay | Admitting: Hematology and Oncology

## 2015-11-04 NOTE — Progress Notes (Signed)
Per Shirlean Mylar fax amgen form again with page 4 filled out completely. I refaxed for possible neulasta asst.

## 2015-11-04 NOTE — Assessment & Plan Note (Signed)
Left breast cancer treated with neoadjuvant antiestrogen therapy followed by lumpectomy 11/10/2010 followed by radiation completed 01/11/2011, antiestrogen therapy with Femara 2.5 mg daily started 02/03/2011, diagnosed with metastatic disease May 2016 PET/CT 03/04/15: Bulky metastatic disease in supraclavicular and Mediastinal LN and 3 hepatic lesions.  Biopsy of the mediastinal LN: Metastatic Breast cancer ER 0%, PR 0%, Ki 67 60%, Her 2 Neg  Current treatment: Halaven started 03/19/15; completed 6 cycles 07/02/15 , now on maintenance HalavenCycle 12 (treatment being changed her days 1 and 8 every 3 weeks because of progression noted on the CT scan 09/14/2015)  CT chest abdomen pelvis 09/22/2015: slight increased nodularity in the prevascular lymph node measuring 2.7 x 4.5 cm previously 2.4 x 4.5 cm, new enhancing nodule 1.7 x 1.8 cm  Chemotherapy toxicities: 1. Chemotherapy-induced neuropathy: Much improved with B-12, magnesium supplementation 2. Chemotherapy-induced pancytopenia: Patient to get Neulasta injection on day 8 of chemotherapy.  3. Tonsillitis/pharyngitis: improved after Levaquin for 1 week 4. Conjunctival irritation: sent a prescription for ciprofloxacin eyedrops I also renewed her magnesium supplementation  Return to clinic with next cycle.

## 2015-11-05 ENCOUNTER — Other Ambulatory Visit (HOSPITAL_BASED_OUTPATIENT_CLINIC_OR_DEPARTMENT_OTHER): Payer: Medicaid Other

## 2015-11-05 ENCOUNTER — Encounter: Payer: Self-pay | Admitting: Hematology and Oncology

## 2015-11-05 ENCOUNTER — Ambulatory Visit (HOSPITAL_BASED_OUTPATIENT_CLINIC_OR_DEPARTMENT_OTHER): Payer: Medicaid Other

## 2015-11-05 ENCOUNTER — Other Ambulatory Visit: Payer: Medicaid Other

## 2015-11-05 ENCOUNTER — Ambulatory Visit (HOSPITAL_BASED_OUTPATIENT_CLINIC_OR_DEPARTMENT_OTHER): Payer: Medicaid Other | Admitting: Hematology and Oncology

## 2015-11-05 ENCOUNTER — Telehealth: Payer: Self-pay | Admitting: Hematology and Oncology

## 2015-11-05 VITALS — BP 141/72 | HR 106 | Temp 98.0°F | Resp 18 | Ht 65.0 in | Wt 144.0 lb

## 2015-11-05 DIAGNOSIS — C50912 Malignant neoplasm of unspecified site of left female breast: Secondary | ICD-10-CM

## 2015-11-05 DIAGNOSIS — C7801 Secondary malignant neoplasm of right lung: Secondary | ICD-10-CM

## 2015-11-05 DIAGNOSIS — C50512 Malignant neoplasm of lower-outer quadrant of left female breast: Secondary | ICD-10-CM | POA: Diagnosis present

## 2015-11-05 DIAGNOSIS — C778 Secondary and unspecified malignant neoplasm of lymph nodes of multiple regions: Secondary | ICD-10-CM | POA: Diagnosis not present

## 2015-11-05 DIAGNOSIS — K769 Liver disease, unspecified: Secondary | ICD-10-CM | POA: Diagnosis not present

## 2015-11-05 DIAGNOSIS — C787 Secondary malignant neoplasm of liver and intrahepatic bile duct: Secondary | ICD-10-CM

## 2015-11-05 DIAGNOSIS — C50212 Malignant neoplasm of upper-inner quadrant of left female breast: Secondary | ICD-10-CM

## 2015-11-05 DIAGNOSIS — Z5111 Encounter for antineoplastic chemotherapy: Secondary | ICD-10-CM

## 2015-11-05 DIAGNOSIS — T451X5A Adverse effect of antineoplastic and immunosuppressive drugs, initial encounter: Secondary | ICD-10-CM

## 2015-11-05 DIAGNOSIS — D61810 Antineoplastic chemotherapy induced pancytopenia: Secondary | ICD-10-CM

## 2015-11-05 DIAGNOSIS — G620 Drug-induced polyneuropathy: Secondary | ICD-10-CM

## 2015-11-05 LAB — CBC WITH DIFFERENTIAL/PLATELET
BASO%: 0.2 % (ref 0.0–2.0)
BASOS ABS: 0 10*3/uL (ref 0.0–0.1)
EOS%: 0 % (ref 0.0–7.0)
Eosinophils Absolute: 0 10*3/uL (ref 0.0–0.5)
HCT: 36.1 % (ref 34.8–46.6)
HGB: 11.7 g/dL (ref 11.6–15.9)
LYMPH%: 21.2 % (ref 14.0–49.7)
MCH: 28 pg (ref 25.1–34.0)
MCHC: 32.4 g/dL (ref 31.5–36.0)
MCV: 86.4 fL (ref 79.5–101.0)
MONO#: 0.3 10*3/uL (ref 0.1–0.9)
MONO%: 7.5 % (ref 0.0–14.0)
NEUT#: 3.1 10*3/uL (ref 1.5–6.5)
NEUT%: 71.1 % (ref 38.4–76.8)
Platelets: 159 10*3/uL (ref 145–400)
RBC: 4.18 10*6/uL (ref 3.70–5.45)
RDW: 15.4 % — ABNORMAL HIGH (ref 11.2–14.5)
WBC: 4.4 10*3/uL (ref 3.9–10.3)
lymph#: 0.9 10*3/uL (ref 0.9–3.3)

## 2015-11-05 LAB — COMPREHENSIVE METABOLIC PANEL
ALK PHOS: 97 U/L (ref 40–150)
ALT: <9 U/L (ref 0–55)
AST: 10 U/L (ref 5–34)
Albumin: 3.6 g/dL (ref 3.5–5.0)
Anion Gap: 10 mEq/L (ref 3–11)
BUN: 7.6 mg/dL (ref 7.0–26.0)
CHLORIDE: 104 meq/L (ref 98–109)
CO2: 25 mEq/L (ref 22–29)
CREATININE: 0.7 mg/dL (ref 0.6–1.1)
Calcium: 9.5 mg/dL (ref 8.4–10.4)
EGFR: >90 mL/min/{1.73_m2} (ref >90)
GLUCOSE: 156 mg/dL — AB (ref 70–140)
POTASSIUM: 3.9 meq/L (ref 3.5–5.1)
SODIUM: 139 meq/L (ref 136–145)
Total Bilirubin: <0.3 mg/dL (ref 0.20–1.20)
Total Protein: 7.3 g/dL (ref 6.4–8.3)

## 2015-11-05 MED ORDER — PALONOSETRON HCL INJECTION 0.25 MG/5ML
0.2500 mg | Freq: Once | INTRAVENOUS | Status: AC
  Administered 2015-11-05: 0.25 mg via INTRAVENOUS

## 2015-11-05 MED ORDER — PALONOSETRON HCL INJECTION 0.25 MG/5ML
INTRAVENOUS | Status: AC
  Filled 2015-11-05: qty 5

## 2015-11-05 MED ORDER — HEPARIN SOD (PORK) LOCK FLUSH 100 UNIT/ML IV SOLN
500.0000 [IU] | Freq: Once | INTRAVENOUS | Status: AC | PRN
  Administered 2015-11-05: 500 [IU]
  Filled 2015-11-05: qty 5

## 2015-11-05 MED ORDER — SODIUM CHLORIDE 0.9 % IV SOLN
Freq: Once | INTRAVENOUS | Status: AC
  Administered 2015-11-05: 11:00:00 via INTRAVENOUS

## 2015-11-05 MED ORDER — SODIUM CHLORIDE 0.9 % IJ SOLN
10.0000 mL | INTRAMUSCULAR | Status: DC | PRN
  Administered 2015-11-05: 10 mL
  Filled 2015-11-05: qty 10

## 2015-11-05 MED ORDER — SODIUM CHLORIDE 0.9 % IV SOLN
1.0000 mg/m2 | Freq: Once | INTRAVENOUS | Status: AC
  Administered 2015-11-05: 1.7 mg via INTRAVENOUS
  Filled 2015-11-05: qty 3.4

## 2015-11-05 MED ORDER — SODIUM CHLORIDE 0.9 % IV SOLN
Freq: Once | INTRAVENOUS | Status: AC
  Administered 2015-11-05: 12:00:00 via INTRAVENOUS
  Filled 2015-11-05: qty 1

## 2015-11-05 NOTE — Telephone Encounter (Signed)
Appointments made and avs printed for patient °

## 2015-11-05 NOTE — Addendum Note (Signed)
Addended by: Prentiss Bells on: 11/05/2015 01:36 PM   Modules accepted: Orders, Medications

## 2015-11-05 NOTE — Progress Notes (Signed)
Patient Care Team: Tresa Garter, MD as PCP - General (Internal Medicine)  DIAGNOSIS: No matching staging information was found for the patient.  SUMMARY OF ONCOLOGIC HISTORY:   Breast cancer of lower-outer quadrant of left female breast ([place])   04/06/2010 - 10/26/2010 Anti-estrogen oral therapy Neoadjuvant antiestrogen therapy   11/10/2010 Surgery Left breast lumpectomy: Invasive ductal carcinoma 2 cm, grade 3, angiolymphatic invasion present, 6 lymph nodes negative, ER 99%, PR 90%, Ki-67 20%, HER-2 negative ratio 1.22   12/01/2010 - 01/11/2011 Radiation Therapy Adjuvant radiation therapy   02/03/2011 -  Anti-estrogen oral therapy Femara 2.5 mg daily   02/22/2015 Imaging CT chest revealed 6 cm mediastinal mass, right hilar lymph nodes, base of neck lesions in both right and left, subtle liver metastases, periportal lymph nodes   02/28/2015 PET scan Bulky bilateral hypermetabolic supraclavicular and mediastinal lymphadenopathy consistent with eoplasm/metastatic disease. Three hepatic metastatic lesions   03/04/2015 Initial Biopsy Mediastinal mass Biopsy: Invasive Mammary cancer ER Neg   03/19/2015 -  Chemotherapy Halaven day 1 and day 8 every 3 weeks    CHIEF COMPLIANT: cycle 12 Halaven  INTERVAL HISTORY: Michele Hale is a 70 year old with above-mentioned history of left breast cancer metastatic disease currently on palliative chemotherapy with halaven. She is tolerating the chemotherapy extremely well without any major problems or concerns. She denies a nausea and vomiting. She had pain on the scalp and the earlobe both of which have resolved. She is eating well and has excellent appetite. Denies any neuropathy.  REVIEW OF SYSTEMS:   Constitutional: Denies fevers, chills or abnormal weight loss Eyes: Denies blurriness of vision Ears, nose, mouth, throat, and face: Denies mucositis or sore throat Respiratory: Denies cough, dyspnea or wheezes Cardiovascular: Denies palpitation, chest  discomfort Gastrointestinal:  Denies nausea, heartburn or change in bowel habits Skin: Denies abnormal skin rashes Lymphatics: Denies new lymphadenopathy or easy bruising Neurological:Denies numbness, tingling or new weaknesses Behavioral/Psych: Mood is stable, no new changes  Extremities: No lower extremity edema Breast:  denies any pain or lumps or nodules in either breasts All other systems were reviewed with the patient and are negative.  I have reviewed the past medical history, past surgical history, social history and family history with the patient and they are unchanged from previous note.  ALLERGIES:  has No Known Allergies.  MEDICATIONS:  Current Outpatient Prescriptions  Medication Sig Dispense Refill  . acetaminophen (TYLENOL) 500 MG tablet Take 1,000 mg by mouth every 6 (six) hours as needed. For arthritis pain    . calcium-vitamin D (OSCAL WITH D) 500-200 MG-UNIT per tablet Take 1 tablet by mouth 2 (two) times daily.    . cholecalciferol (VITAMIN D) 1000 UNITS tablet Take 1 tablet (1,000 Units total) by mouth daily. 30 tablet 12  . ciprofloxacin (CILOXAN) 0.3 % ophthalmic solution Place 1 drop into both eyes 3 (three) times daily. Administer 1 drop each eye three times daily 5 mL 0  . dexamethasone (DECADRON) 1 MG tablet Take 0.5 tablets (0.5 mg total) by mouth daily. 30 tablet 1  . fluconazole (DIFLUCAN) 100 MG tablet Take 1 tablet (100 mg total) by mouth daily. 7 tablet 0  . HYDROcodone-acetaminophen (NORCO/VICODIN) 5-325 MG per tablet Take 1-2 tablets by mouth every 4 (four) hours as needed. (Patient taking differently: Take 1-2 tablets by mouth every 4 (four) hours as needed for moderate pain. ) 10 tablet 0  . ibuprofen (ADVIL,MOTRIN) 800 MG tablet Take 1 tablet (800 mg total) by mouth every 8 (eight) hours  as needed. 30 tablet 3  . levofloxacin (LEVAQUIN) 500 MG tablet Take 1 tablet (500 mg total) by mouth daily. 7 tablet 0  . lidocaine-prilocaine (EMLA) cream Apply to  port-a-cath one and a half hours before use.  Cover cream with plastic wrap to seal. 30 g 3  . loratadine (CLARITIN) 10 MG tablet Take 1 tablet (10 mg total) by mouth daily. 30 tablet 3  . magnesium oxide (MAG-OX) 400 (241.3 MG) MG tablet Take 1 tablet (400 mg total) by mouth daily. 30 tablet 3  . ondansetron (ZOFRAN) 8 MG tablet Take 1 tablet (8 mg total) by mouth 2 (two) times daily. Start the day after chemo for 2 days. Then take as needed for nausea or vomiting. 30 tablet 1  . promethazine (PHENERGAN) 25 MG tablet Take 1 tablet (25 mg total) by mouth every 6 (six) hours as needed for nausea or vomiting. 30 tablet 6   No current facility-administered medications for this visit.   Facility-Administered Medications Ordered in Other Visits  Medication Dose Route Frequency Provider Last Rate Last Dose  . sodium chloride 0.9 % injection 10 mL  10 mL Intracatheter PRN Nicholas Lose, MD   10 mL at 07/22/15 1643  . sodium chloride 0.9 % injection 10 mL  10 mL Intracatheter PRN Nicholas Lose, MD   10 mL at 10/15/15 1616  . sodium chloride 0.9 % injection 10 mL  10 mL Intracatheter PRN Nicholas Lose, MD   10 mL at 11/05/15 1215    PHYSICAL EXAMINATION: ECOG PERFORMANCE STATUS: 1 - Symptomatic but completely ambulatory  Filed Vitals:   11/05/15 1009  BP: 141/72  Pulse: 106  Temp: 98 F (36.7 C)  Resp: 18   Filed Weights   11/05/15 1009  Weight: 144 lb (65.318 kg)    GENERAL:alert, no distress and comfortable SKIN: skin color, texture, turgor are normal, no rashes or significant lesions EYES: normal, Conjunctiva are pink and non-injected, sclera clear OROPHARYNX:no exudate, no erythema and lips, buccal mucosa, and tongue normal  NECK: supple, thyroid normal size, non-tender, without nodularity LYMPH:  no palpable lymphadenopathy in the cervical, axillary or inguinal LUNGS: clear to auscultation and percussion with normal breathing effort HEART: regular rate & rhythm and no murmurs and no  lower extremity edema ABDOMEN:abdomen soft, non-tender and normal bowel sounds MUSCULOSKELETAL:no cyanosis of digits and no clubbing  NEURO: alert & oriented x 3 with fluent speech, no focal motor/sensory deficits EXTREMITIES: No lower extremity edema  LABORATORY DATA:  I have reviewed the data as listed   Chemistry      Component Value Date/Time   NA 139 11/05/2015 0954   NA 141 02/21/2015 1530   K 3.9 11/05/2015 0954   K 4.0 02/21/2015 1530   CL 102 02/21/2015 1530   CL 104 01/23/2013 1420   CO2 25 11/05/2015 0954   CO2 24 02/21/2015 1530   BUN 7.6 11/05/2015 0954   BUN 13 02/21/2015 1530   CREATININE 0.7 11/05/2015 0954   CREATININE 0.54 02/21/2015 1530      Component Value Date/Time   CALCIUM 9.5 11/05/2015 0954   CALCIUM 9.5 02/21/2015 1530   ALKPHOS 97 11/05/2015 0954   ALKPHOS 63 02/07/2012 1312   AST 10 11/05/2015 0954   AST 17 02/07/2012 1312   ALT <9 11/05/2015 0954   ALT 15 02/07/2012 1312   BILITOT <0.30 11/05/2015 0954   BILITOT 0.3 02/07/2012 1312       Lab Results  Component Value Date  WBC 4.4 11/05/2015   HGB 11.7 11/05/2015   HCT 36.1 11/05/2015   MCV 86.4 11/05/2015   PLT 159 11/05/2015   NEUTROABS 3.1 11/05/2015     ASSESSMENT & PLAN:  Breast cancer of lower-outer quadrant of left female breast ([place]) Left breast cancer treated with neoadjuvant antiestrogen therapy followed by lumpectomy 11/10/2010 followed by radiation completed 01/11/2011, antiestrogen therapy with Femara 2.5 mg daily started 02/03/2011, diagnosed with metastatic disease May 2016 PET/CT 03/04/15: Bulky metastatic disease in supraclavicular and Mediastinal LN and 3 hepatic lesions.  Biopsy of the mediastinal LN: Metastatic Breast cancer ER 0%, PR 0%, Ki 67 60%, Her 2 Neg  Current treatment: Halaven started 03/19/15; completed 6 cycles 07/02/15 , now on maintenance HalavenCycle 12 (treatment being changed her days 1 and 8 every 3 weeks because of progression noted on the CT  scan 09/14/2015)  CT chest abdomen pelvis 09/22/2015: slight increased nodularity in the prevascular lymph node measuring 2.7 x 4.5 cm previously 2.4 x 4.5 cm, new enhancing nodule 1.7 x 1.8 cm  Chemotherapy toxicities: 1. Chemotherapy-induced neuropathy: Much improved with B-12, magnesium supplementation 2. Chemotherapy-induced pancytopenia: Patient to get Neulasta injection on day 8 of chemotherapy.  3. Tonsillitis/pharyngitis: improved after Levaquin for 1 week 4. Conjunctival irritation: sent a prescription for ciprofloxacin eyedrops, I recommended that she use over-the-counter saline eyedrops to prevent dry eyes.  We plan to do CT scans after 2 more cycles.  Return to clinic with next cycle.   No orders of the defined types were placed in this encounter.   The patient has a good understanding of the overall plan. she agrees with it. she will call with any problems that may develop before the next visit here.   Rulon Eisenmenger, MD 11/05/2015

## 2015-11-12 ENCOUNTER — Other Ambulatory Visit (HOSPITAL_BASED_OUTPATIENT_CLINIC_OR_DEPARTMENT_OTHER): Payer: Medicaid Other

## 2015-11-12 ENCOUNTER — Ambulatory Visit (HOSPITAL_BASED_OUTPATIENT_CLINIC_OR_DEPARTMENT_OTHER): Payer: Medicaid Other

## 2015-11-12 VITALS — BP 129/65 | HR 103 | Temp 98.2°F | Resp 18

## 2015-11-12 DIAGNOSIS — C50412 Malignant neoplasm of upper-outer quadrant of left female breast: Secondary | ICD-10-CM

## 2015-11-12 DIAGNOSIS — C50912 Malignant neoplasm of unspecified site of left female breast: Secondary | ICD-10-CM

## 2015-11-12 DIAGNOSIS — Z5111 Encounter for antineoplastic chemotherapy: Secondary | ICD-10-CM

## 2015-11-12 LAB — CBC WITH DIFFERENTIAL/PLATELET
BASO%: 0.7 % (ref 0.0–2.0)
BASOS ABS: 0 10*3/uL (ref 0.0–0.1)
EOS%: 0.3 % (ref 0.0–7.0)
Eosinophils Absolute: 0 10*3/uL (ref 0.0–0.5)
HEMATOCRIT: 32.1 % — AB (ref 34.8–46.6)
HGB: 10.4 g/dL — ABNORMAL LOW (ref 11.6–15.9)
LYMPH#: 0.8 10*3/uL — AB (ref 0.9–3.3)
LYMPH%: 28.7 % (ref 14.0–49.7)
MCH: 27.4 pg (ref 25.1–34.0)
MCHC: 32.4 g/dL (ref 31.5–36.0)
MCV: 84.7 fL (ref 79.5–101.0)
MONO#: 0.2 10*3/uL (ref 0.1–0.9)
MONO%: 5.5 % (ref 0.0–14.0)
NEUT#: 1.9 10*3/uL (ref 1.5–6.5)
NEUT%: 64.8 % (ref 38.4–76.8)
PLATELETS: 220 10*3/uL (ref 145–400)
RBC: 3.79 10*6/uL (ref 3.70–5.45)
RDW: 14.8 % — ABNORMAL HIGH (ref 11.2–14.5)
WBC: 2.9 10*3/uL — ABNORMAL LOW (ref 3.9–10.3)

## 2015-11-12 LAB — COMPREHENSIVE METABOLIC PANEL
ALT: 11 U/L (ref 0–55)
AST: 12 U/L (ref 5–34)
Albumin: 3.6 g/dL (ref 3.5–5.0)
Alkaline Phosphatase: 79 U/L (ref 40–150)
Anion Gap: 10 mEq/L (ref 3–11)
BUN: 9.9 mg/dL (ref 7.0–26.0)
CO2: 25 meq/L (ref 22–29)
CREATININE: 0.7 mg/dL (ref 0.6–1.1)
Calcium: 9.3 mg/dL (ref 8.4–10.4)
Chloride: 103 mEq/L (ref 98–109)
EGFR: >90 mL/min/{1.73_m2} (ref >90)
GLUCOSE: 183 mg/dL — AB (ref 70–140)
Potassium: 3.8 mEq/L (ref 3.5–5.1)
SODIUM: 138 meq/L (ref 136–145)
TOTAL PROTEIN: 7 g/dL (ref 6.4–8.3)

## 2015-11-12 MED ORDER — SODIUM CHLORIDE 0.9 % IV SOLN
Freq: Once | INTRAVENOUS | Status: AC
  Administered 2015-11-12: 11:00:00 via INTRAVENOUS

## 2015-11-12 MED ORDER — PEGFILGRASTIM 6 MG/0.6ML ~~LOC~~ PSKT
6.0000 mg | PREFILLED_SYRINGE | Freq: Once | SUBCUTANEOUS | Status: AC
  Administered 2015-11-12: 6 mg via SUBCUTANEOUS
  Filled 2015-11-12: qty 0.6

## 2015-11-12 MED ORDER — ERIBULIN MESYLATE CHEMO INJECTION 1 MG/2ML
1.0000 mg/m2 | Freq: Once | INTRAVENOUS | Status: AC
  Administered 2015-11-12: 1.7 mg via INTRAVENOUS
  Filled 2015-11-12: qty 3.4

## 2015-11-12 MED ORDER — PALONOSETRON HCL INJECTION 0.25 MG/5ML
INTRAVENOUS | Status: AC
  Filled 2015-11-12: qty 5

## 2015-11-12 MED ORDER — SODIUM CHLORIDE 0.9 % IV SOLN
Freq: Once | INTRAVENOUS | Status: AC
  Administered 2015-11-12: 11:00:00 via INTRAVENOUS
  Filled 2015-11-12: qty 1

## 2015-11-12 MED ORDER — PALONOSETRON HCL INJECTION 0.25 MG/5ML
0.2500 mg | Freq: Once | INTRAVENOUS | Status: AC
  Administered 2015-11-12: 0.25 mg via INTRAVENOUS

## 2015-11-14 ENCOUNTER — Telehealth: Payer: Self-pay | Admitting: *Deleted

## 2015-11-14 NOTE — Telephone Encounter (Signed)
Patient's son called asking advice for mom. 1. When she swallows with meals, everyone at the table hears a loud noise.  Something drops and a gurgling sound at times.  She denies any pain.  Has been on antibiotics twice the last few months.  She may need this again. 2. Having increased coughing at night.  Snoring so loudly we hear her through closed door.  Breathing with difficulty.   These symptoms have been present for a while but are more obvious now.  Return number [phone].  Admits to mom c/o heartburn and indigestion.  Reportedly eats at 5:00 pm, in bed by 8 to 9:00 pm.  Admits to breaks in sound of her snoring pattern.  Stops snoring at times.  Will notify Dr. Lindi Adie and asked he notify PCP.

## 2015-11-19 ENCOUNTER — Other Ambulatory Visit: Payer: Self-pay

## 2015-11-19 ENCOUNTER — Ambulatory Visit: Payer: Medicaid Other

## 2015-11-19 DIAGNOSIS — C50512 Malignant neoplasm of lower-outer quadrant of left female breast: Secondary | ICD-10-CM

## 2015-11-19 MED ORDER — PROMETHAZINE HCL 25 MG PO TABS: 25.0000 mg | ORAL_TABLET | Freq: Four times a day (QID) | ORAL | Status: AC | PRN

## 2015-11-19 MED FILL — PROMETHAZINE 25 MG TABLET: 25 | 60 days supply | Qty: 60 | Fill #0

## 2015-11-26 ENCOUNTER — Encounter: Payer: Self-pay | Admitting: Physician Assistant

## 2015-11-26 ENCOUNTER — Ambulatory Visit (HOSPITAL_BASED_OUTPATIENT_CLINIC_OR_DEPARTMENT_OTHER): Payer: Medicaid Other

## 2015-11-26 ENCOUNTER — Other Ambulatory Visit (HOSPITAL_BASED_OUTPATIENT_CLINIC_OR_DEPARTMENT_OTHER): Payer: Medicaid Other

## 2015-11-26 ENCOUNTER — Telehealth: Payer: Self-pay | Admitting: Hematology and Oncology

## 2015-11-26 ENCOUNTER — Other Ambulatory Visit: Payer: Self-pay

## 2015-11-26 ENCOUNTER — Other Ambulatory Visit: Payer: Self-pay | Admitting: Hematology and Oncology

## 2015-11-26 ENCOUNTER — Ambulatory Visit (HOSPITAL_BASED_OUTPATIENT_CLINIC_OR_DEPARTMENT_OTHER): Payer: Medicaid Other | Admitting: Hematology and Oncology

## 2015-11-26 VITALS — BP 149/74 | HR 88 | Temp 97.5°F | Resp 18 | Ht 65.0 in | Wt 143.7 lb

## 2015-11-26 DIAGNOSIS — C778 Secondary and unspecified malignant neoplasm of lymph nodes of multiple regions: Secondary | ICD-10-CM

## 2015-11-26 DIAGNOSIS — R1319 Other dysphagia: Secondary | ICD-10-CM | POA: Diagnosis not present

## 2015-11-26 DIAGNOSIS — C50512 Malignant neoplasm of lower-outer quadrant of left female breast: Secondary | ICD-10-CM | POA: Diagnosis not present

## 2015-11-26 DIAGNOSIS — C50912 Malignant neoplasm of unspecified site of left female breast: Secondary | ICD-10-CM

## 2015-11-26 DIAGNOSIS — D61810 Antineoplastic chemotherapy induced pancytopenia: Secondary | ICD-10-CM

## 2015-11-26 DIAGNOSIS — Z5111 Encounter for antineoplastic chemotherapy: Secondary | ICD-10-CM | POA: Diagnosis not present

## 2015-11-26 DIAGNOSIS — C7801 Secondary malignant neoplasm of right lung: Secondary | ICD-10-CM

## 2015-11-26 DIAGNOSIS — H109 Unspecified conjunctivitis: Secondary | ICD-10-CM

## 2015-11-26 DIAGNOSIS — G622 Polyneuropathy due to other toxic agents: Secondary | ICD-10-CM | POA: Diagnosis not present

## 2015-11-26 DIAGNOSIS — K769 Liver disease, unspecified: Secondary | ICD-10-CM

## 2015-11-26 DIAGNOSIS — R1310 Dysphagia, unspecified: Secondary | ICD-10-CM

## 2015-11-26 DIAGNOSIS — C787 Secondary malignant neoplasm of liver and intrahepatic bile duct: Secondary | ICD-10-CM

## 2015-11-26 LAB — COMPREHENSIVE METABOLIC PANEL
ALT: 12 U/L (ref 0–55)
ANION GAP: 11 meq/L (ref 3–11)
AST: 10 U/L (ref 5–34)
Albumin: 3.3 g/dL — ABNORMAL LOW (ref 3.5–5.0)
Alkaline Phosphatase: 116 U/L (ref 40–150)
BILIRUBIN TOTAL: 0.33 mg/dL (ref 0.20–1.20)
BUN: 14.1 mg/dL (ref 7.0–26.0)
CALCIUM: 9.7 mg/dL (ref 8.4–10.4)
CHLORIDE: 102 meq/L (ref 98–109)
CO2: 26 mEq/L (ref 22–29)
CREATININE: 0.7 mg/dL (ref 0.6–1.1)
Glucose: 118 mg/dl (ref 70–140)
Potassium: 3.7 mEq/L (ref 3.5–5.1)
Sodium: 139 mEq/L (ref 136–145)
TOTAL PROTEIN: 7.5 g/dL (ref 6.4–8.3)

## 2015-11-26 LAB — CBC WITH DIFFERENTIAL/PLATELET
BASO%: 0.5 % (ref 0.0–2.0)
BASOS ABS: 0 10*3/uL (ref 0.0–0.1)
EOS ABS: 0 10*3/uL (ref 0.0–0.5)
EOS%: 0.1 % (ref 0.0–7.0)
HEMATOCRIT: 34.6 % — AB (ref 34.8–46.6)
HGB: 11.5 g/dL — ABNORMAL LOW (ref 11.6–15.9)
LYMPH#: 1.2 10*3/uL (ref 0.9–3.3)
LYMPH%: 19 % (ref 14.0–49.7)
MCH: 28 pg (ref 25.1–34.0)
MCHC: 33.1 g/dL (ref 31.5–36.0)
MCV: 84.4 fL (ref 79.5–101.0)
MONO#: 0.4 10*3/uL (ref 0.1–0.9)
MONO%: 6 % (ref 0.0–14.0)
NEUT#: 4.7 10*3/uL (ref 1.5–6.5)
NEUT%: 74.4 % (ref 38.4–76.8)
PLATELETS: 207 10*3/uL (ref 145–400)
RBC: 4.1 10*6/uL (ref 3.70–5.45)
RDW: 16 % — ABNORMAL HIGH (ref 11.2–14.5)
WBC: 6.3 10*3/uL (ref 3.9–10.3)

## 2015-11-26 MED ORDER — HEPARIN SOD (PORK) LOCK FLUSH 100 UNIT/ML IV SOLN
500.0000 [IU] | Freq: Once | INTRAVENOUS | Status: AC | PRN
  Filled 2015-11-26: qty 5

## 2015-11-26 MED ORDER — SODIUM CHLORIDE 0.9 % IV SOLN
Freq: Once | INTRAVENOUS | Status: AC
  Administered 2015-11-26: 10:00:00 via INTRAVENOUS

## 2015-11-26 MED ORDER — SODIUM CHLORIDE 0.9 % IV SOLN
Freq: Once | INTRAVENOUS | Status: AC
  Administered 2015-11-26: 11:00:00 via INTRAVENOUS
  Filled 2015-11-26: qty 1

## 2015-11-26 MED ORDER — PALONOSETRON HCL INJECTION 0.25 MG/5ML
0.2500 mg | Freq: Once | INTRAVENOUS | Status: AC
  Administered 2015-11-26: 0.25 mg via INTRAVENOUS

## 2015-11-26 MED ORDER — SODIUM CHLORIDE 0.9 % IV SOLN
1.0000 mg/m2 | Freq: Once | INTRAVENOUS | Status: AC
  Administered 2015-11-26: 1.7 mg via INTRAVENOUS
  Filled 2015-11-26: qty 3.4

## 2015-11-26 MED ORDER — SODIUM CHLORIDE 0.9 % IJ SOLN
10.0000 mL | INTRAMUSCULAR | Status: DC | PRN
  Filled 2015-11-26: qty 10

## 2015-11-26 MED ORDER — PALONOSETRON HCL INJECTION 0.25 MG/5ML
INTRAVENOUS | Status: AC
  Filled 2015-11-26: qty 5

## 2015-11-26 NOTE — Telephone Encounter (Signed)
Appointments made and avs printed for patient,aware that [facility] for md appointment and central scheduling will call with barium study and scans  anne

## 2015-11-26 NOTE — Patient Instructions (Signed)
[facility] Discharge Instructions for Patients Receiving Chemotherapy  Today you received the following chemotherapy agents: Halaven  To help prevent nausea and vomiting after your treatment, we encourage you to take your nausea medication as directed.    If you develop nausea and vomiting that is not controlled by your nausea medication, call the clinic.   BELOW ARE SYMPTOMS THAT SHOULD BE REPORTED IMMEDIATELY:  *FEVER GREATER THAN 100.5 F  *CHILLS WITH OR WITHOUT FEVER  NAUSEA AND VOMITING THAT IS NOT CONTROLLED WITH YOUR NAUSEA MEDICATION  *UNUSUAL SHORTNESS OF BREATH  *UNUSUAL BRUISING OR BLEEDING  TENDERNESS IN MOUTH AND THROAT WITH OR WITHOUT PRESENCE OF ULCERS  *URINARY PROBLEMS  *BOWEL PROBLEMS  UNUSUAL RASH Items with * indicate a potential emergency and should be followed up as soon as possible.  Feel free to call the clinic you have any questions or concerns. The clinic phone number is [phone].  Please show the CHEMO ALERT CARD at check-in to the Emergency Department and triage nurse.   

## 2015-11-26 NOTE — Progress Notes (Signed)
Patient Care Team: Tresa Garter, MD as PCP - General (Internal Medicine)  DIAGNOSIS: No matching staging information was found for the patient.  SUMMARY OF ONCOLOGIC HISTORY:   Breast cancer of lower-outer quadrant of left female breast ([place])   04/06/2010 - 10/26/2010 Anti-estrogen oral therapy Neoadjuvant antiestrogen therapy   11/10/2010 Surgery Left breast lumpectomy: Invasive ductal carcinoma 2 cm, grade 3, angiolymphatic invasion present, 6 lymph nodes negative, ER 99%, PR 90%, Ki-67 20%, HER-2 negative ratio 1.22   12/01/2010 - 01/11/2011 Radiation Therapy Adjuvant radiation therapy   02/03/2011 -  Anti-estrogen oral therapy Femara 2.5 mg daily   02/22/2015 Imaging CT chest revealed 6 cm mediastinal mass, right hilar lymph nodes, base of neck lesions in both right and left, subtle liver metastases, periportal lymph nodes   02/28/2015 PET scan Bulky bilateral hypermetabolic supraclavicular and mediastinal lymphadenopathy consistent with eoplasm/metastatic disease. Three hepatic metastatic lesions   03/04/2015 Initial Biopsy Mediastinal mass Biopsy: Invasive Mammary cancer ER Neg   03/19/2015 -  Chemotherapy Halaven day 1 and day 8 every 3 weeks    CHIEF COMPLIANT: complains of dysphagia, cycle 13 Helaven chemotherapy  INTERVAL HISTORY: Michele Hale is a 70 year old with above-mentioned history of metastatic breast cancer currently on palliative chemotherapy with halaven. She is complaining today of severe dysphagia to solid foods. She has never previously complained about this however when I get more history appears that she has always had a mild discomfort along with acid reflux like symptoms but over the past couple of wheezing has become hard to swallow solid food. She has not thrown up however she needs to drink lots of water to swallow the food. Her family also reported that she has been stumbling a little bit. When I inquire about peripheral neuropathy she refuses to admit that she  has any problems with sensory deficit on her feet. She appears to be tolerating chemotherapy otherwise fairly well did not have any nausea or vomiting. Denies any diarrhea. Denies any fevers or chills.  REVIEW OF SYSTEMS:   Constitutional: Denies fevers, chills or abnormal weight loss Eyes: Denies blurriness of vision Ears, nose, mouth, throat, and face: Denies mucositis or sore throat Respiratory: Denies cough, dyspnea or wheezes Cardiovascular: Denies palpitation, chest discomfort Gastrointestinal:  Complains of dysphagia to solid foods Skin: Denies abnormal skin rashes Lymphatics: Denies new lymphadenopathy or easy bruising Neurological:Denies numbness, tingling or new weaknesses Behavioral/Psych: Mood is stable, no new changes  Extremities: No lower extremity edema  All other systems were reviewed with the patient and are negative.  I have reviewed the past medical history, past surgical history, social history and family history with the patient and they are unchanged from previous note.  ALLERGIES:  has No Known Allergies.  MEDICATIONS:  Current Outpatient Prescriptions  Medication Sig Dispense Refill  . acetaminophen (TYLENOL) 500 MG tablet Take 1,000 mg by mouth every 6 (six) hours as needed. For arthritis pain    . calcium-vitamin D (OSCAL WITH D) 500-200 MG-UNIT per tablet Take 1 tablet by mouth 2 (two) times daily.    . cholecalciferol (VITAMIN D) 1000 UNITS tablet Take 1 tablet (1,000 Units total) by mouth daily. 30 tablet 12  . dexamethasone (DECADRON) 1 MG tablet Take 0.5 tablets (0.5 mg total) by mouth daily. 30 tablet 1  . fluconazole (DIFLUCAN) 100 MG tablet Take 1 tablet (100 mg total) by mouth daily. 7 tablet 0  . HYDROcodone-acetaminophen (NORCO/VICODIN) 5-325 MG per tablet Take 1-2 tablets by mouth every 4 (four) hours  as needed. (Patient taking differently: Take 1-2 tablets by mouth every 4 (four) hours as needed for moderate pain. ) 10 tablet 0  . ibuprofen  (ADVIL,MOTRIN) 800 MG tablet Take 1 tablet (800 mg total) by mouth every 8 (eight) hours as needed. 30 tablet 3  . levofloxacin (LEVAQUIN) 500 MG tablet Take 1 tablet (500 mg total) by mouth daily. 7 tablet 0  . lidocaine-prilocaine (EMLA) cream Apply to port-a-cath one and a half hours before use.  Cover cream with plastic wrap to seal. 30 g 3  . loratadine (CLARITIN) 10 MG tablet Take 1 tablet (10 mg total) by mouth daily. 30 tablet 3  . magnesium oxide (MAG-OX) 400 (241.3 MG) MG tablet Take 1 tablet (400 mg total) by mouth daily. 30 tablet 3  . ondansetron (ZOFRAN) 8 MG tablet Take 1 tablet (8 mg total) by mouth 2 (two) times daily. Start the day after chemo for 2 days. Then take as needed for nausea or vomiting. 30 tablet 1  . promethazine (PHENERGAN) 25 MG tablet Take 1 tablet (25 mg total) by mouth every 6 (six) hours as needed for nausea or vomiting. 30 tablet 6   No current facility-administered medications for this visit.   Facility-Administered Medications Ordered in Other Visits  Medication Dose Route Frequency Provider Last Rate Last Dose  . sodium chloride 0.9 % injection 10 mL  10 mL Intracatheter PRN Nicholas Lose, MD   10 mL at 07/22/15 1643  . sodium chloride 0.9 % injection 10 mL  10 mL Intracatheter PRN Nicholas Lose, MD   10 mL at 10/15/15 1616    PHYSICAL EXAMINATION: ECOG PERFORMANCE STATUS: 1 - Symptomatic but completely ambulatory  Filed Vitals:   11/26/15 0859  BP: 149/74  Pulse: 88  Temp: 97.5 F (36.4 C)  Resp: 18   Filed Weights   11/26/15 0859  Weight: 143 lb 11.2 oz (65.182 kg)    GENERAL:alert, no distress and comfortable SKIN: skin color, texture, turgor are normal, no rashes or significant lesions EYES: normal, Conjunctiva are pink and non-injected, sclera clear OROPHARYNX:no exudate, no erythema and lips, buccal mucosa, and tongue normal  NECK: supple, thyroid normal size, non-tender, without nodularity LYMPH:  no palpable lymphadenopathy in the  cervical, axillary or inguinal LUNGS: clear to auscultation and percussion with normal breathing effort HEART: regular rate & rhythm and no murmurs and no lower extremity edema ABDOMEN:no abdominal pain, no hepatosplenomegaly MUSCULOSKELETAL:no cyanosis of digits and no clubbing  NEURO: alert & oriented x 3 with fluent speech, grade 1 peripheral neuropathy  EXTREMITIES: No lower extremity edema   LABORATORY DATA:  I have reviewed the data as listed   Chemistry      Component Value Date/Time   NA 139 11/26/2015 0845   NA 141 02/21/2015 1530   K 3.7 11/26/2015 0845   K 4.0 02/21/2015 1530   CL 102 02/21/2015 1530   CL 104 01/23/2013 1420   CO2 26 11/26/2015 0845   CO2 24 02/21/2015 1530   BUN 14.1 11/26/2015 0845   BUN 13 02/21/2015 1530   CREATININE 0.7 11/26/2015 0845   CREATININE 0.54 02/21/2015 1530      Component Value Date/Time   CALCIUM 9.7 11/26/2015 0845   CALCIUM 9.5 02/21/2015 1530   ALKPHOS 116 11/26/2015 0845   ALKPHOS 63 02/07/2012 1312   AST 10 11/26/2015 0845   AST 17 02/07/2012 1312   ALT 12 11/26/2015 0845   ALT 15 02/07/2012 1312   BILITOT 0.33 11/26/2015 0845  BILITOT 0.3 02/07/2012 1312       Lab Results  Component Value Date   WBC 6.3 11/26/2015   HGB 11.5* 11/26/2015   HCT 34.6* 11/26/2015   MCV 84.4 11/26/2015   PLT 207 11/26/2015   NEUTROABS 4.7 11/26/2015     ASSESSMENT & PLAN:  Breast cancer of lower-outer quadrant of left female breast ([place]) Left breast cancer treated with neoadjuvant antiestrogen therapy followed by lumpectomy 11/10/2010 followed by radiation completed 01/11/2011, antiestrogen therapy with Femara 2.5 mg daily started 02/03/2011, diagnosed with metastatic disease May 2016 PET/CT 03/04/15: Bulky metastatic disease in supraclavicular and Mediastinal LN and 3 hepatic lesions.  Biopsy of the mediastinal LN: Metastatic Breast cancer ER 0%, PR 0%, Ki 67 60%, Her 2 Neg  Current treatment: Halaven started 03/19/15;  completed 6 cycles 07/02/15 , now on maintenance HalavenCycle 13 (treatment being changed to days 1 and 8 every 3 weeks because of progression noted on the CT scan 09/14/2015)  CT chest abdomen pelvis 09/22/2015: slight increased nodularity in the prevascular lymph node measuring 2.7 x 4.5 cm previously 2.4 x 4.5 cm, new enhancing nodule 1.7 x 1.8 cm  Chemotherapy toxicities: 1. Chemotherapy-induced neuropathy: Much improved with B-12, magnesium supplementation 2. Chemotherapy-induced pancytopenia: Patient to get Neulasta injection on day 8 of chemotherapy. 3. Tonsillitis/pharyngitis:  resolved 4. Conjunctival irritation:  Much improved with ciprofloxacin eyedrops, I recommended that she use over-the-counter saline eyedrops to prevent dry eyes. 5. Severe dysphagia to solid foods: I would like to obtain a barium swallow esophagogram, we will obtain a CT of her neck chest abdomen pelvis for restaging of metastatic breast cancer. I will refer patient to gastroenterology for dysphagia evaluation. 6. Peripheral neuropathy: Grade 1: we are monitoring her symptoms. We plan to do CT scans after next cycle.  Return to clinic with next cycle Review the results of the scans and follow-up..   Orders Placed This Encounter  Procedures  . DG Esophagus    Standing Status: Future     Number of Occurrences:      Standing Expiration Date: 01/23/2017    Order Specific Question:  Reason for Exam (SYMPTOM  OR DIAGNOSIS REQUIRED)    Answer:  Dysphagia to solids    Order Specific Question:  Preferred imaging location?    Answer:  [facility]  . CT Abdomen Pelvis W Contrast    Standing Status: Future     Number of Occurrences:      Standing Expiration Date: 11/25/2016    Order Specific Question:  If indicated for the ordered procedure, I authorize the administration of contrast media per Radiology protocol    Answer:  Yes    Order Specific Question:  Reason for Exam (SYMPTOM  OR DIAGNOSIS REQUIRED)     Answer:  Metastatic breast cancer restaging    Order Specific Question:  Preferred imaging location?    Answer:  [facility]  . CT Chest W Contrast    Standing Status: Future     Number of Occurrences:      Standing Expiration Date: 11/25/2016    Order Specific Question:  If indicated for the ordered procedure, I authorize the administration of contrast media per Radiology protocol    Answer:  Yes    Order Specific Question:  Reason for Exam (SYMPTOM  OR DIAGNOSIS REQUIRED)    Answer:  Metastatic breast cancer restaging    Order Specific Question:  Preferred imaging location?    Answer:  [facility]  . CT  Soft Tissue Neck W Contrast    Standing Status: Future     Number of Occurrences:      Standing Expiration Date: 11/25/2016    Order Specific Question:  If indicated for the ordered procedure, I authorize the administration of contrast media per Radiology protocol    Answer:  Yes    Order Specific Question:  Reason for Exam (SYMPTOM  OR DIAGNOSIS REQUIRED)    Answer:  Metastatic breast cancer restaging    Order Specific Question:  Preferred imaging location?    Answer:  [facility]  . Ambulatory referral to Gastroenterology    Referral Priority:  Routine    Referral Type:  Consultation    Referral Reason:  Specialty Services Required    Number of Visits Requested:  1   The patient has a good understanding of the overall plan. she agrees with it. she will call with any problems that may develop before the next visit here.   Rulon Eisenmenger, MD 11/26/2015

## 2015-11-26 NOTE — Progress Notes (Signed)
Unable to get in to exam room prior to MD.  No assessment performed.  

## 2015-11-26 NOTE — Telephone Encounter (Signed)
Seen this morning 11-26-2015 for F/U.

## 2015-11-26 NOTE — Assessment & Plan Note (Signed)
Left breast cancer treated with neoadjuvant antiestrogen therapy followed by lumpectomy 11/10/2010 followed by radiation completed 01/11/2011, antiestrogen therapy with Femara 2.5 mg daily started 02/03/2011, diagnosed with metastatic disease May 2016 PET/CT 03/04/15: Bulky metastatic disease in supraclavicular and Mediastinal LN and 3 hepatic lesions.  Biopsy of the mediastinal LN: Metastatic Breast cancer ER 0%, PR 0%, Ki 67 60%, Her 2 Neg  Current treatment: Halaven started 03/19/15; completed 6 cycles 07/02/15 , now on maintenance HalavenCycle 13 (treatment being changed to days 1 and 8 every 3 weeks because of progression noted on the CT scan 09/14/2015)  CT chest abdomen pelvis 09/22/2015: slight increased nodularity in the prevascular lymph node measuring 2.7 x 4.5 cm previously 2.4 x 4.5 cm, new enhancing nodule 1.7 x 1.8 cm  Chemotherapy toxicities: 1. Chemotherapy-induced neuropathy: Much improved with B-12, magnesium supplementation 2. Chemotherapy-induced pancytopenia: Patient to get Neulasta injection on day 8 of chemotherapy. 3. Tonsillitis/pharyngitis:  resolved 4. Conjunctival irritation:  Much improved with ciprofloxacin eyedrops, I recommended that she use over-the-counter saline eyedrops to prevent dry eyes.  We plan to do CT scans after next cycle.  Return to clinic with next cycle.

## 2015-12-03 ENCOUNTER — Ambulatory Visit: Payer: Medicaid Other

## 2015-12-03 ENCOUNTER — Encounter: Payer: Self-pay | Admitting: Hematology and Oncology

## 2015-12-03 ENCOUNTER — Ambulatory Visit (HOSPITAL_BASED_OUTPATIENT_CLINIC_OR_DEPARTMENT_OTHER): Payer: Medicaid Other

## 2015-12-03 ENCOUNTER — Other Ambulatory Visit: Payer: Self-pay | Admitting: *Deleted

## 2015-12-03 VITALS — BP 97/65 | HR 111 | Temp 98.2°F | Resp 20

## 2015-12-03 DIAGNOSIS — C50512 Malignant neoplasm of lower-outer quadrant of left female breast: Secondary | ICD-10-CM

## 2015-12-03 DIAGNOSIS — Z5111 Encounter for antineoplastic chemotherapy: Secondary | ICD-10-CM

## 2015-12-03 LAB — CBC WITH DIFFERENTIAL/PLATELET
BASO%: 0.9 % (ref 0.0–2.0)
BASOS ABS: 0 10*3/uL (ref 0.0–0.1)
EOS ABS: 0 10*3/uL (ref 0.0–0.5)
EOS%: 0.2 % (ref 0.0–7.0)
HEMATOCRIT: 35.7 % (ref 34.8–46.6)
HEMOGLOBIN: 11.5 g/dL — AB (ref 11.6–15.9)
LYMPH%: 32.8 % (ref 14.0–49.7)
MCH: 27.2 pg (ref 25.1–34.0)
MCHC: 32.3 g/dL (ref 31.5–36.0)
MCV: 84.2 fL (ref 79.5–101.0)
MONO#: 0.1 10*3/uL (ref 0.1–0.9)
MONO%: 5.3 % (ref 0.0–14.0)
NEUT%: 60.8 % (ref 38.4–76.8)
NEUTROS ABS: 1.7 10*3/uL (ref 1.5–6.5)
PLATELETS: 326 10*3/uL (ref 145–400)
RBC: 4.24 10*6/uL (ref 3.70–5.45)
RDW: 15.4 % — AB (ref 11.2–14.5)
WBC: 2.8 10*3/uL — AB (ref 3.9–10.3)
lymph#: 0.9 10*3/uL (ref 0.9–3.3)

## 2015-12-03 LAB — COMPREHENSIVE METABOLIC PANEL
ALBUMIN: 3.6 g/dL (ref 3.5–5.0)
ALK PHOS: 98 U/L (ref 40–150)
ALT: 21 U/L (ref 0–55)
AST: 19 U/L (ref 5–34)
Anion Gap: 13 mEq/L — ABNORMAL HIGH (ref 3–11)
BILIRUBIN TOTAL: 0.3 mg/dL (ref 0.20–1.20)
BUN: 6.2 mg/dL — ABNORMAL LOW (ref 7.0–26.0)
CALCIUM: 9.7 mg/dL (ref 8.4–10.4)
CO2: 25 mEq/L (ref 22–29)
Chloride: 101 mEq/L (ref 98–109)
Creatinine: 0.8 mg/dL (ref 0.6–1.1)
GLUCOSE: 185 mg/dL — AB (ref 70–140)
POTASSIUM: 3.9 meq/L (ref 3.5–5.1)
Sodium: 140 mEq/L (ref 136–145)
TOTAL PROTEIN: 7.5 g/dL (ref 6.4–8.3)

## 2015-12-03 MED ORDER — PALONOSETRON HCL INJECTION 0.25 MG/5ML
INTRAVENOUS | Status: AC
  Filled 2015-12-03: qty 5

## 2015-12-03 MED ORDER — SODIUM CHLORIDE 0.9 % IV SOLN
Freq: Once | INTRAVENOUS | Status: AC
  Administered 2015-12-03: 10:00:00 via INTRAVENOUS
  Filled 2015-12-03: qty 1

## 2015-12-03 MED ORDER — SODIUM CHLORIDE 0.9 % IV SOLN
Freq: Once | INTRAVENOUS | Status: AC
  Administered 2015-12-03: 10:00:00 via INTRAVENOUS

## 2015-12-03 MED ORDER — PALONOSETRON HCL INJECTION 0.25 MG/5ML
0.2500 mg | Freq: Once | INTRAVENOUS | Status: AC
  Administered 2015-12-03: 0.25 mg via INTRAVENOUS

## 2015-12-03 MED ORDER — SODIUM CHLORIDE 0.9 % IJ SOLN
10.0000 mL | INTRAMUSCULAR | Status: DC | PRN
  Administered 2015-12-03: 10 mL
  Filled 2015-12-03: qty 10

## 2015-12-03 MED ORDER — HEPARIN SOD (PORK) LOCK FLUSH 100 UNIT/ML IV SOLN
500.0000 [IU] | Freq: Once | INTRAVENOUS | Status: AC | PRN
  Administered 2015-12-03: 500 [IU]
  Filled 2015-12-03: qty 5

## 2015-12-03 MED ORDER — PEGFILGRASTIM 6 MG/0.6ML ~~LOC~~ PSKT
6.0000 mg | PREFILLED_SYRINGE | Freq: Once | SUBCUTANEOUS | Status: AC
  Administered 2015-12-03: 6 mg via SUBCUTANEOUS
  Filled 2015-12-03: qty 0.6

## 2015-12-03 MED ORDER — SODIUM CHLORIDE 0.9 % IV SOLN
1.0000 mg/m2 | Freq: Once | INTRAVENOUS | Status: AC
  Administered 2015-12-03: 1.7 mg via INTRAVENOUS
  Filled 2015-12-03: qty 3.4

## 2015-12-03 NOTE — Progress Notes (Signed)
Insurance is paying neulasta..I shredded safety net foundation app

## 2015-12-03 NOTE — Patient Instructions (Addendum)
[facility] Discharge Instructions for Patients Receiving Chemotherapy  Today you received the following chemotherapy agents: Halaven.   To help prevent nausea and vomiting after your treatment, we encourage you to take your nausea medication as directed.    If you develop nausea and vomiting that is not controlled by your nausea medication, call the clinic.   BELOW ARE SYMPTOMS THAT SHOULD BE REPORTED IMMEDIATELY:  *FEVER GREATER THAN 100.5 F  *CHILLS WITH OR WITHOUT FEVER  NAUSEA AND VOMITING THAT IS NOT CONTROLLED WITH YOUR NAUSEA MEDICATION  *UNUSUAL SHORTNESS OF BREATH  *UNUSUAL BRUISING OR BLEEDING  TENDERNESS IN MOUTH AND THROAT WITH OR WITHOUT PRESENCE OF ULCERS  *URINARY PROBLEMS  *BOWEL PROBLEMS  UNUSUAL RASH Items with * indicate a potential emergency and should be followed up as soon as possible.  Feel free to call the clinic you have any questions or concerns. The clinic phone number is [phone].  Please show the [facility] at check-in to the Emergency Department and triage nurse.  Pegfilgrastim injection What is this medicine? PEGFILGRASTIM (PEG fil gra stim) is a long-acting granulocyte colony-stimulating factor that stimulates the growth of neutrophils, a type of white blood cell important in the body's fight against infection. It is used to reduce the incidence of fever and infection in patients with certain types of cancer who are receiving chemotherapy that affects the bone marrow, and to increase survival after being exposed to high doses of radiation. This medicine may be used for other purposes; ask your health care provider or pharmacist if you have questions. What should I tell my health care provider before I take this medicine? They need to know if you have any of these conditions: -kidney disease -latex allergy -ongoing radiation therapy -sickle cell disease -skin reactions to acrylic adhesives (On-Body Injector  only) -an unusual or allergic reaction to pegfilgrastim, filgrastim, other medicines, foods, dyes, or preservatives -pregnant or trying to get pregnant -breast-feeding How should I use this medicine? This medicine is for injection under the skin. If you get this medicine at home, you will be taught how to prepare and give the pre-filled syringe or how to use the On-body Injector. Refer to the patient Instructions for Use for detailed instructions. Use exactly as directed. Take your medicine at regular intervals. Do not take your medicine more often than directed. It is important that you put your used needles and syringes in a special sharps container. Do not put them in a trash can. If you do not have a sharps container, call your pharmacist or healthcare provider to get one. Talk to your pediatrician regarding the use of this medicine in children. While this drug may be prescribed for selected conditions, precautions do apply. Overdosage: If you think you have taken too much of this medicine contact a poison control center or emergency room at once. NOTE: This medicine is only for you. Do not share this medicine with others. What if I miss a dose? It is important not to miss your dose. Call your doctor or health care professional if you miss your dose. If you miss a dose due to an On-body Injector failure or leakage, a new dose should be administered as soon as possible using a single prefilled syringe for manual use. What may interact with this medicine? Interactions have not been studied. Give your health care provider a list of all the medicines, herbs, non-prescription drugs, or dietary supplements you use. Also tell them if you smoke, drink  alcohol, or use illegal drugs. Some items may interact with your medicine. This list may not describe all possible interactions. Give your health care provider a list of all the medicines, herbs, non-prescription drugs, or dietary supplements you use. Also  tell them if you smoke, drink alcohol, or use illegal drugs. Some items may interact with your medicine. What should I watch for while using this medicine? You may need blood work done while you are taking this medicine. If you are going to need a MRI, CT scan, or other procedure, tell your doctor that you are using this medicine (On-Body Injector only). What side effects may I notice from receiving this medicine? Side effects that you should report to your doctor or health care professional as soon as possible: -allergic reactions like skin rash, itching or hives, swelling of the face, lips, or tongue -dizziness -fever -pain, redness, or irritation at site where injected -pinpoint red spots on the skin -red or dark-brown urine -shortness of breath or breathing problems -stomach or side pain, or pain at the shoulder -swelling -tiredness -trouble passing urine or change in the amount of urine Side effects that usually do not require medical attention (report to your doctor or health care professional if they continue or are bothersome): -bone pain -muscle pain This list may not describe all possible side effects. Call your doctor for medical advice about side effects. You may report side effects to FDA at 1-800-FDA-1088. Where should I keep my medicine? Keep out of the reach of children. Store pre-filled syringes in a refrigerator between 2 and 8 degrees C (36 and 46 degrees F). Do not freeze. Keep in carton to protect from light. Throw away this medicine if it is left out of the refrigerator for more than 48 hours. Throw away any unused medicine after the expiration date. NOTE: This sheet is a summary. It may not cover all possible information. If you have questions about this medicine, talk to your doctor, pharmacist, or health care provider.    2016, Elsevier/Gold Standard. (2014-10-31 14:30:14)

## 2015-12-10 ENCOUNTER — Ambulatory Visit: Payer: Medicaid Other

## 2015-12-15 ENCOUNTER — Telehealth: Payer: Self-pay | Admitting: *Deleted

## 2015-12-15 ENCOUNTER — Encounter (HOSPITAL_COMMUNITY): Payer: Self-pay

## 2015-12-15 ENCOUNTER — Ambulatory Visit (HOSPITAL_COMMUNITY)
Admission: RE | Admit: 2015-12-15 | Discharge: 2015-12-15 | Disposition: A | Discharge status: Home / Self Care | Payer: Medicaid Other | Source: Ambulatory Visit | Attending: Hematology and Oncology | Admitting: Hematology and Oncology

## 2015-12-15 DIAGNOSIS — R1310 Dysphagia, unspecified: Secondary | ICD-10-CM | POA: Diagnosis present

## 2015-12-15 DIAGNOSIS — C50512 Malignant neoplasm of lower-outer quadrant of left female breast: Secondary | ICD-10-CM | POA: Diagnosis present

## 2015-12-15 DIAGNOSIS — R590 Localized enlarged lymph nodes: Secondary | ICD-10-CM | POA: Insufficient documentation

## 2015-12-15 DIAGNOSIS — I2510 Atherosclerotic heart disease of native coronary artery without angina pectoris: Secondary | ICD-10-CM | POA: Diagnosis not present

## 2015-12-15 MED ORDER — IOHEXOL 300 MG/ML  SOLN
100.0000 mL | Freq: Once | INTRAMUSCULAR | Status: AC | PRN
  Administered 2015-12-15: 100 mL via INTRAVENOUS

## 2015-12-15 MED ORDER — IOHEXOL 300 MG/ML  SOLN
50.0000 mL | Freq: Once | INTRAMUSCULAR | Status: DC | PRN
  Administered 2015-12-15: 50 mL via ORAL
  Filled 2015-12-15: qty 50

## 2015-12-15 NOTE — Telephone Encounter (Signed)
Call report from [facility] with [facility].  Patient S/P CT soft Tissue Neck with contrast.  "IMPRESSION: 1. Questionable 6 mm mass in the left pons. Recommend brain MRI to evaluate for metastatic disease. CT Chest/abdomen pelvis   IMPRESSION: 1. Today's study demonstrates progression of metastatic disease, with interval increase in mediastinal lymphadenopathy, and new development of a large lytic lesion in the superior aspect of the right ilium." Will notify provider.  Next scheduled F/U 12-17-2015.

## 2015-12-16 ENCOUNTER — Other Ambulatory Visit: Payer: Self-pay

## 2015-12-16 DIAGNOSIS — C50512 Malignant neoplasm of lower-outer quadrant of left female breast: Secondary | ICD-10-CM

## 2015-12-16 NOTE — Assessment & Plan Note (Signed)
Left breast cancer treated with neoadjuvant antiestrogen therapy followed by lumpectomy 11/10/2010 followed by radiation completed 01/11/2011, antiestrogen therapy with Femara 2.5 mg daily started 02/03/2011, diagnosed with metastatic disease May 2016 PET/CT 03/04/15: Bulky metastatic disease in supraclavicular and Mediastinal LN and 3 hepatic lesions.  Biopsy of the mediastinal LN: Metastatic Breast cancer ER 0%, PR 0%, Ki 67 60%, Her 2 Neg  Current treatment: Halaven started 03/19/15; completed 6 cycles 07/02/15 , now on maintenance HalavenCycle 13 (treatment being changed to days 1 and 8 every 3 weeks because of progression noted on the CT scan 09/14/2015)  Chemotherapy toxicities: 1. Chemotherapy-induced neuropathy: Much improved with B-12, magnesium supplementation 2. Chemotherapy-induced pancytopenia: Patient to get Neulasta injection on day 8 of chemotherapy. 3. Tonsillitis/pharyngitis: resolved 4. Conjunctival irritation: Much improved with ciprofloxacin eyedrops, I recommended that she use over-the-counter saline eyedrops to prevent dry eyes. 5. Severe dysphagia to solid foods: I would like to obtain a barium swallow esophagogram, we will obtain a CT of her neck chest abdomen pelvis for restaging of metastatic breast cancer. I will refer patient to gastroenterology for dysphagia evaluation. 6. Peripheral neuropathy: Grade 1: we are monitoring her symptoms 7. Dysphagia: barium swallow normal  CT CAP 12/15/15: Progression of mets in mediastinum 3.6 X 6.1 cm (was 2.7 x 4.6 cm), new large lytic lesion Rt ileum, 2.9 cm mass left breast same as before, manubrium and sternum stable.  Treatment Plan: Ibrance with Letrozole

## 2015-12-17 ENCOUNTER — Ambulatory Visit (HOSPITAL_BASED_OUTPATIENT_CLINIC_OR_DEPARTMENT_OTHER): Payer: Medicaid Other | Admitting: Hematology and Oncology

## 2015-12-17 ENCOUNTER — Telehealth: Payer: Self-pay | Admitting: Hematology and Oncology

## 2015-12-17 ENCOUNTER — Ambulatory Visit: Payer: Self-pay

## 2015-12-17 ENCOUNTER — Other Ambulatory Visit (HOSPITAL_BASED_OUTPATIENT_CLINIC_OR_DEPARTMENT_OTHER): Payer: Medicaid Other

## 2015-12-17 ENCOUNTER — Ambulatory Visit: Payer: Medicaid Other

## 2015-12-17 ENCOUNTER — Encounter: Payer: Self-pay | Admitting: Hematology and Oncology

## 2015-12-17 VITALS — BP 137/79 | HR 103 | Temp 97.9°F | Resp 19 | Wt 143.5 lb

## 2015-12-17 DIAGNOSIS — C778 Secondary and unspecified malignant neoplasm of lymph nodes of multiple regions: Secondary | ICD-10-CM | POA: Diagnosis not present

## 2015-12-17 DIAGNOSIS — M899 Disorder of bone, unspecified: Secondary | ICD-10-CM

## 2015-12-17 DIAGNOSIS — K769 Liver disease, unspecified: Secondary | ICD-10-CM

## 2015-12-17 DIAGNOSIS — R1319 Other dysphagia: Secondary | ICD-10-CM

## 2015-12-17 DIAGNOSIS — C7951 Secondary malignant neoplasm of bone: Secondary | ICD-10-CM

## 2015-12-17 DIAGNOSIS — C7801 Secondary malignant neoplasm of right lung: Secondary | ICD-10-CM

## 2015-12-17 DIAGNOSIS — C50512 Malignant neoplasm of lower-outer quadrant of left female breast: Secondary | ICD-10-CM

## 2015-12-17 DIAGNOSIS — G622 Polyneuropathy due to other toxic agents: Secondary | ICD-10-CM

## 2015-12-17 DIAGNOSIS — C787 Secondary malignant neoplasm of liver and intrahepatic bile duct: Secondary | ICD-10-CM

## 2015-12-17 DIAGNOSIS — G620 Drug-induced polyneuropathy: Secondary | ICD-10-CM | POA: Diagnosis not present

## 2015-12-17 DIAGNOSIS — D61810 Antineoplastic chemotherapy induced pancytopenia: Secondary | ICD-10-CM

## 2015-12-17 DIAGNOSIS — T451X5A Adverse effect of antineoplastic and immunosuppressive drugs, initial encounter: Secondary | ICD-10-CM

## 2015-12-17 LAB — CBC WITH DIFFERENTIAL/PLATELET
BASO%: 0.2 % (ref 0.0–2.0)
BASOS ABS: 0 10*3/uL (ref 0.0–0.1)
EOS%: 0 % (ref 0.0–7.0)
Eosinophils Absolute: 0 10*3/uL (ref 0.0–0.5)
HEMATOCRIT: 36.2 % (ref 34.8–46.6)
HGB: 11.6 g/dL (ref 11.6–15.9)
LYMPH#: 0.9 10*3/uL (ref 0.9–3.3)
LYMPH%: 17.6 % (ref 14.0–49.7)
MCH: 27.8 pg (ref 25.1–34.0)
MCHC: 32 g/dL (ref 31.5–36.0)
MCV: 86.8 fL (ref 79.5–101.0)
MONO#: 0.3 10*3/uL (ref 0.1–0.9)
MONO%: 6.2 % (ref 0.0–14.0)
NEUT%: 76 % (ref 38.4–76.8)
NEUTROS ABS: 4 10*3/uL (ref 1.5–6.5)
Platelets: 190 10*3/uL (ref 145–400)
RBC: 4.17 10*6/uL (ref 3.70–5.45)
RDW: 16.2 % — ABNORMAL HIGH (ref 11.2–14.5)
WBC: 5.3 10*3/uL (ref 3.9–10.3)

## 2015-12-17 LAB — COMPREHENSIVE METABOLIC PANEL
ALBUMIN: 3.5 g/dL (ref 3.5–5.0)
AST: 14 U/L (ref 5–34)
Alkaline Phosphatase: 104 U/L (ref 40–150)
Anion Gap: 10 mEq/L (ref 3–11)
BUN: 9.3 mg/dL (ref 7.0–26.0)
CALCIUM: 9.7 mg/dL (ref 8.4–10.4)
CO2: 28 mEq/L (ref 22–29)
CREATININE: 0.7 mg/dL (ref 0.6–1.1)
Chloride: 103 mEq/L (ref 98–109)
EGFR: >90 mL/min/{1.73_m2} (ref >90)
GLUCOSE: 106 mg/dL (ref 70–140)
POTASSIUM: 4.5 meq/L (ref 3.5–5.1)
SODIUM: 141 meq/L (ref 136–145)
TOTAL PROTEIN: 7.5 g/dL (ref 6.4–8.3)
Total Bilirubin: 0.31 mg/dL (ref 0.20–1.20)

## 2015-12-17 MED ORDER — PALBOCICLIB 125 MG PO CAPS: 125.0000 mg | ORAL_CAPSULE | Freq: Every day | ORAL | Status: DC

## 2015-12-17 MED FILL — IBRANCE 125 MG CAPSULE: 125 | 21 days supply | Qty: 21 | Fill #0

## 2015-12-17 NOTE — Addendum Note (Signed)
Addended by: Prentiss Bells on: 12/17/2015 10:43 AM   Modules accepted: Orders

## 2015-12-17 NOTE — Progress Notes (Signed)
Unable to get in to exam room prior to MD.  No assessment performed.  

## 2015-12-17 NOTE — Telephone Encounter (Signed)
appt made and avs printed °

## 2015-12-17 NOTE — Progress Notes (Signed)
Patient Care Team: Tresa Garter, MD as PCP - General (Internal Medicine)  SUMMARY OF ONCOLOGIC HISTORY:   Breast cancer of lower-outer quadrant of left female breast ([place])   04/06/2010 - 10/26/2010 Anti-estrogen oral therapy Neoadjuvant antiestrogen therapy   11/10/2010 Surgery Left breast lumpectomy: Invasive ductal carcinoma 2 cm, grade 3, angiolymphatic invasion present, 6 lymph nodes negative, ER 99%, PR 90%, Ki-67 20%, HER-2 negative ratio 1.22   12/01/2010 - 01/11/2011 Radiation Therapy Adjuvant radiation therapy   02/03/2011 -  Anti-estrogen oral therapy Femara 2.5 mg daily   02/22/2015 Imaging CT chest revealed 6 cm mediastinal mass, right hilar lymph nodes, base of neck lesions in both right and left, subtle liver metastases, periportal lymph nodes   02/28/2015 PET scan Bulky bilateral hypermetabolic supraclavicular and mediastinal lymphadenopathy consistent with eoplasm/metastatic disease. Three hepatic metastatic lesions   03/04/2015 Initial Biopsy Mediastinal mass Biopsy: Invasive Mammary cancer ER Neg   03/19/2015 - 12/03/2015 Chemotherapy Halaven day 1 and day 8 every 3 weeks (stopped for progression)   12/15/2015 Imaging Progression of mets in mediastinum 3.6 X 6.1 cm (was 2.7 x 4.6 cm), new large lytic lesion Rt ileum, 2.9 cm mass left breast same as before, manubrium and sternum stable   12/17/2015 -  Anti-estrogen oral therapy Faslodex + Ibrance + XGeva    CHIEF COMPLIANT: Patient is here to follow-up on scans  INTERVAL HISTORY: Michele Hale is a 70 year old with above-mentioned history metastatic breast cancer with a large anterior mediastinal mass along with supraclavicular lymphadenopathy along with liver lesions. She had been on Halaven chemotherapy and had completed 13 cycles of treatment. She had a CT scan and is here today to discuss the results. A CT scan shows progression of disease in the mediastinum from 4.6 cm to 6.1 cm. There is a new large lytic lesion in the right  ilium. There is no regression of disease in the liver on any other place. She does not have any pain in the right ilium.  REVIEW OF SYSTEMS:   Constitutional: Denies fevers, chills or abnormal weight loss Eyes: Denies blurriness of vision Ears, nose, mouth, throat, and face: Denies mucositis or sore throat Respiratory: Denies cough, dyspnea or wheezes Cardiovascular: Denies palpitation, chest discomfort Gastrointestinal:  Denies nausea, heartburn or change in bowel habits Skin: Denies abnormal skin rashes Lymphatics: Denies new lymphadenopathy or easy bruising Neurological:Denies numbness, tingling or new weaknesses Behavioral/Psych: Mood is stable, no new changes  Extremities: No lower extremity edema Breast:  denies any pain or lumps or nodules in either breasts All other systems were reviewed with the patient and are negative.  I have reviewed the past medical history, past surgical history, social history and family history with the patient and they are unchanged from previous note.  ALLERGIES:  has No Known Allergies.  MEDICATIONS:  Current Outpatient Prescriptions  Medication Sig Dispense Refill  . acetaminophen (TYLENOL) 500 MG tablet Take 1,000 mg by mouth every 6 (six) hours as needed. For arthritis pain    . calcium-vitamin D (OSCAL WITH D) 500-200 MG-UNIT per tablet Take 1 tablet by mouth 2 (two) times daily.    . cholecalciferol (VITAMIN D) 1000 UNITS tablet Take 1 tablet (1,000 Units total) by mouth daily. 30 tablet 12  . dexamethasone (DECADRON) 1 MG tablet Take 0.5 tablets (0.5 mg total) by mouth daily. 30 tablet 1  . fluconazole (DIFLUCAN) 100 MG tablet Take 1 tablet (100 mg total) by mouth daily. 7 tablet 0  . HYDROcodone-acetaminophen (NORCO/VICODIN) 5-325 MG  per tablet Take 1-2 tablets by mouth every 4 (four) hours as needed. (Patient taking differently: Take 1-2 tablets by mouth every 4 (four) hours as needed for moderate pain. ) 10 tablet 0  . ibuprofen  (ADVIL,MOTRIN) 800 MG tablet Take 1 tablet (800 mg total) by mouth every 8 (eight) hours as needed. 30 tablet 3  . levofloxacin (LEVAQUIN) 500 MG tablet Take 1 tablet (500 mg total) by mouth daily. 7 tablet 0  . lidocaine-prilocaine (EMLA) cream Apply to port-a-cath one and a half hours before use.  Cover cream with plastic wrap to seal. 30 g 3  . loratadine (CLARITIN) 10 MG tablet Take 1 tablet (10 mg total) by mouth daily. 30 tablet 3  . magnesium oxide (MAG-OX) 400 (241.3 MG) MG tablet Take 1 tablet (400 mg total) by mouth daily. 30 tablet 3  . promethazine (PHENERGAN) 25 MG tablet Take 1 tablet (25 mg total) by mouth every 6 (six) hours as needed for nausea or vomiting. 30 tablet 6   No current facility-administered medications for this visit.    PHYSICAL EXAMINATION: ECOG PERFORMANCE STATUS: 1 - Symptomatic but completely ambulatory  Filed Vitals:   12/17/15 0918  BP: 137/79  Pulse: 103  Temp: 97.9 F (36.6 C)  Resp: 19   Filed Weights   12/17/15 0918  Weight: 143 lb 8 oz (65.091 kg)    GENERAL:alert, no distress and comfortable SKIN: skin color, texture, turgor are normal, no rashes or significant lesions EYES: normal, Conjunctiva are pink and non-injected, sclera clear OROPHARYNX:no exudate, no erythema and lips, buccal mucosa, and tongue normal  NECK: supple, thyroid normal size, non-tender, without nodularity LYMPH:  no palpable lymphadenopathy in the cervical, axillary or inguinal LUNGS: clear to auscultation and percussion with normal breathing effort HEART: regular rate & rhythm and no murmurs and no lower extremity edema ABDOMEN:abdomen soft, non-tender and normal bowel sounds MUSCULOSKELETAL:no cyanosis of digits and no clubbing  NEURO: alert & oriented x 3 with fluent speech, no focal motor/sensory deficits EXTREMITIES: No lower extremity edema  LABORATORY DATA:  I have reviewed the data as listed   Chemistry      Component Value Date/Time   NA 141  12/17/2015 0905   NA 141 02/21/2015 1530   K 4.5 12/17/2015 0905   K 4.0 02/21/2015 1530   CL 102 02/21/2015 1530   CL 104 01/23/2013 1420   CO2 28 12/17/2015 0905   CO2 24 02/21/2015 1530   BUN 9.3 12/17/2015 0905   BUN 13 02/21/2015 1530   CREATININE 0.7 12/17/2015 0905   CREATININE 0.54 02/21/2015 1530      Component Value Date/Time   CALCIUM 9.7 12/17/2015 0905   CALCIUM 9.5 02/21/2015 1530   ALKPHOS 104 12/17/2015 0905   ALKPHOS 63 02/07/2012 1312   AST 14 12/17/2015 0905   AST 17 02/07/2012 1312   ALT <9 12/17/2015 0905   ALT 15 02/07/2012 1312   BILITOT 0.31 12/17/2015 0905   BILITOT 0.3 02/07/2012 1312       Lab Results  Component Value Date   WBC 5.3 12/17/2015   HGB 11.6 12/17/2015   HCT 36.2 12/17/2015   MCV 86.8 12/17/2015   PLT 190 12/17/2015   NEUTROABS 4.0 12/17/2015   ASSESSMENT & PLAN:  Breast cancer of lower-outer quadrant of left female breast ([place]) Left breast cancer treated with neoadjuvant antiestrogen therapy followed by lumpectomy 11/10/2010 followed by radiation completed 01/11/2011, antiestrogen therapy with Femara 2.5 mg daily started 02/03/2011, diagnosed with metastatic  disease May 2016 PET/CT 03/04/15: Bulky metastatic disease in supraclavicular and Mediastinal LN and 3 hepatic lesions.  Biopsy of the mediastinal LN: Metastatic Breast cancer ER 0%, PR 0%, Ki 67 60%, Her 2 Neg  Prior Treatment: Halaven started 03/19/15; HalavenCycle 13 (treatment being changed to days 1 and 8 every 3 weeks because of progression noted on the CT scan 09/14/2015)  Chemotherapy toxicities: 1. Chemotherapy-induced neuropathy: Much improved with B-12, magnesium supplementation 2. Chemotherapy-induced pancytopenia: Patient received Neulasta injection on day 8 of chemotherapy. 3. Tonsillitis/pharyngitis: resolved 4. Conjunctival irritation: Much improved with ciprofloxacin eyedrops, I recommended that she use over-the-counter saline eyedrops to prevent dry  eyes. 5. Severe dysphagia to solid foods: I would like to obtain a barium swallow esophagogram, we will obtain a CT of her neck chest abdomen pelvis for restaging of metastatic breast cancer. I will refer patient to gastroenterology for dysphagia evaluation. 6. Peripheral neuropathy: Grade 1: we are monitoring her symptoms 7. Dysphagia: barium swallow normal ----------------------------------------------------------------------------------------------------------------------------------------------- CT CAP 12/15/15: Progression of mets in mediastinum 3.6 X 6.1 cm (was 2.7 x 4.6 cm), new large lytic lesion Rt ileum, 2.9 cm mass left breast same as before, manubrium and sternum stable.  Treatment Plan: Michele Hale with Faslodex Ibrance: I discussed the risks and benefits of Ibrance including myelosuppression especially neutropenia and with that risk of infection, there is risk of pulmonary embolism and mild peripheral neuropathy as well. Fatigue, nausea, diarrhea, decreased appetite as well as alopecia and thrombocytopenia are also potential side effects of Ibrance  Bone metastases: I will start her on X Geva every 28 days starting 12/24/2015 (I discussed with her that she should not undergo teeth extraction 1 she is on XGEVA.  Return to clinic in 3 weeks.  No orders of the defined types were placed in this encounter.   The patient has a good understanding of the overall plan. she agrees with it. she will call with any problems that may develop before the next visit here.   Rulon Eisenmenger, MD 12/17/2015

## 2015-12-22 ENCOUNTER — Other Ambulatory Visit: Payer: Self-pay | Admitting: *Deleted

## 2015-12-22 ENCOUNTER — Telehealth: Payer: Self-pay

## 2015-12-22 DIAGNOSIS — C50512 Malignant neoplasm of lower-outer quadrant of left female breast: Secondary | ICD-10-CM

## 2015-12-22 NOTE — Telephone Encounter (Signed)
Patient's son called today worried because he states that patient is showing signs of "memory loss".  He feels that it is worsening everyday and she is disoriented to her home and keeps trying to pack her suitcase and leave in the middle of the night.  He also feels that she is not sleeping at night.

## 2015-12-22 NOTE — Telephone Encounter (Signed)
Reviewed with Dr. Lindi Adie. Order placed for stat MRI of brain with contrast. Called and left VMM on son's phone [phone]

## 2015-12-24 ENCOUNTER — Ambulatory Visit (HOSPITAL_BASED_OUTPATIENT_CLINIC_OR_DEPARTMENT_OTHER): Payer: Medicaid Other

## 2015-12-24 ENCOUNTER — Ambulatory Visit (HOSPITAL_COMMUNITY)
Admission: RE | Admit: 2015-12-24 | Discharge: 2015-12-24 | Disposition: A | Discharge status: Home / Self Care | Payer: Medicaid Other | Source: Ambulatory Visit | Attending: Hematology and Oncology | Admitting: Hematology and Oncology

## 2015-12-24 VITALS — BP 136/75 | HR 94 | Temp 98.0°F

## 2015-12-24 DIAGNOSIS — C778 Secondary and unspecified malignant neoplasm of lymph nodes of multiple regions: Secondary | ICD-10-CM | POA: Diagnosis not present

## 2015-12-24 DIAGNOSIS — M810 Age-related osteoporosis without current pathological fracture: Secondary | ICD-10-CM

## 2015-12-24 DIAGNOSIS — Z5111 Encounter for antineoplastic chemotherapy: Secondary | ICD-10-CM

## 2015-12-24 DIAGNOSIS — C50512 Malignant neoplasm of lower-outer quadrant of left female breast: Secondary | ICD-10-CM

## 2015-12-24 DIAGNOSIS — G9389 Other specified disorders of brain: Secondary | ICD-10-CM | POA: Insufficient documentation

## 2015-12-24 DIAGNOSIS — C7951 Secondary malignant neoplasm of bone: Secondary | ICD-10-CM | POA: Diagnosis not present

## 2015-12-24 MED ORDER — DENOSUMAB 120 MG/1.7ML ~~LOC~~ SOLN
120.0000 mg | Freq: Once | SUBCUTANEOUS | Status: AC
  Administered 2015-12-24: 120 mg via SUBCUTANEOUS
  Filled 2015-12-24: qty 1.7

## 2015-12-24 MED ORDER — FULVESTRANT 250 MG/5ML IM SOLN
500.0000 mg | INTRAMUSCULAR | Status: DC
  Administered 2015-12-24: 500 mg via INTRAMUSCULAR
  Filled 2015-12-24: qty 10

## 2015-12-24 MED ORDER — GADOBENATE DIMEGLUMINE 529 MG/ML IV SOLN
15.0000 mL | Freq: Once | INTRAVENOUS | Status: AC | PRN
  Administered 2015-12-24: 14 mL via INTRAVENOUS

## 2015-12-25 ENCOUNTER — Other Ambulatory Visit: Payer: Self-pay | Admitting: Hematology and Oncology

## 2015-12-25 ENCOUNTER — Encounter: Payer: Self-pay | Admitting: Physician Assistant

## 2015-12-25 ENCOUNTER — Telehealth: Payer: Self-pay | Admitting: Hematology and Oncology

## 2015-12-25 ENCOUNTER — Ambulatory Visit (INDEPENDENT_AMBULATORY_CARE_PROVIDER_SITE_OTHER): Payer: Medicaid Other | Admitting: Physician Assistant

## 2015-12-25 VITALS — BP 100/70 | HR 68

## 2015-12-25 DIAGNOSIS — C50919 Malignant neoplasm of unspecified site of unspecified female breast: Secondary | ICD-10-CM | POA: Diagnosis not present

## 2015-12-25 DIAGNOSIS — R1310 Dysphagia, unspecified: Secondary | ICD-10-CM

## 2015-12-25 DIAGNOSIS — C799 Secondary malignant neoplasm of unspecified site: Secondary | ICD-10-CM | POA: Diagnosis not present

## 2015-12-25 DIAGNOSIS — C7931 Secondary malignant neoplasm of brain: Secondary | ICD-10-CM | POA: Insufficient documentation

## 2015-12-25 NOTE — Telephone Encounter (Signed)
Referral made to radonc. They will contact patient to schedule appt

## 2015-12-25 NOTE — Progress Notes (Signed)
Reviewed and agree with management plan.  Dayle Mcnerney T. Brendy Ficek, MD FACG 

## 2015-12-25 NOTE — Progress Notes (Signed)
Patient ID: Michele Hale, female   DOB: Aug 16, 1946, 70 y.o.   MRN: [id number]   Subjective:    Patient ID: Michele Hale, female    DOB: Jul 19, 1946, 70 y.o.   MRN: [id number]  HPI Michele Hale  is a 70 year old [place] female, non-English speaking referred today by Dr. Estill Bakes for evaluation of dysphagia. Unfortunately patient has metastatic breast cancer which was initially diagnosed in 2012 and found to be metastatic in May 2016. She has been treated with chemotherapy. Apparently she began complaining of dysphagia and odynophagia about 2 months ago and has undergone a barium swallow on 12/15/2015 which was negative there was no finding of esophageal narrowing, extrinsic compression or stricture and a barium tablet passed without difficulty. CT of the chest abdomen and pelvis on 12/15/2015 showed for other increase in metastatic lymphadenopathy throughout the mediastinum with the largest mass during 3.5 x 6.1 cm. She is also developed right paratracheal nodes esophagus was unremarkable. Her son states that she is able to swallow and seems to swallow fluids without difficulty but solid foods are causing some discomfort and also seeming to stick in her esophagus. She has not had any regurgitation. He says that she does have audible gurgling after she swallows sometimes in her esophagus. She has occasional heartburn at night. No complaints of abdominal pain. No nausea or vomiting. Family mentions that just over the past few days she has had a change in her status and is no longer ambulating she was doing well with up until a few days ago. She is also had some development of disorientation and some confusion. They had an MRI of the brain done last evening and are awaiting results.  Review of Systems Pertinent positive and negative review of systems were noted in the above HPI section.  All other review of systems was otherwise negative.  Outpatient Encounter Prescriptions as of 12/25/2015  Medication  Sig  . acetaminophen (TYLENOL) 500 MG tablet Take 1,000 mg by mouth every 6 (six) hours as needed. For arthritis pain  . calcium-vitamin D (OSCAL WITH D) 500-200 MG-UNIT per tablet Take 1 tablet by mouth 2 (two) times daily.  . cholecalciferol (VITAMIN D) 1000 UNITS tablet Take 1 tablet (1,000 Units total) by mouth daily.  Marland Kitchen dexamethasone (DECADRON) 1 MG tablet Take 0.5 tablets (0.5 mg total) by mouth daily.  . fluconazole (DIFLUCAN) 100 MG tablet Take 1 tablet (100 mg total) by mouth daily.  Marland Kitchen HYDROcodone-acetaminophen (NORCO/VICODIN) 5-325 MG per tablet Take 1-2 tablets by mouth every 4 (four) hours as needed. (Patient taking differently: Take 1-2 tablets by mouth every 4 (four) hours as needed for moderate pain. )  . ibuprofen (ADVIL,MOTRIN) 800 MG tablet Take 1 tablet (800 mg total) by mouth every 8 (eight) hours as needed.  Marland Kitchen levofloxacin (LEVAQUIN) 500 MG tablet Take 1 tablet (500 mg total) by mouth daily.  Marland Kitchen lidocaine-prilocaine (EMLA) cream Apply to port-a-cath one and a half hours before use.  Cover cream with plastic wrap to seal.  . loratadine (CLARITIN) 10 MG tablet Take 1 tablet (10 mg total) by mouth daily.  . magnesium oxide (MAG-OX) 400 (241.3 MG) MG tablet Take 1 tablet (400 mg total) by mouth daily.  . palbociclib (IBRANCE) 125 MG capsule Take 1 capsule (125 mg total) by mouth daily with breakfast. Take whole with food.  . promethazine (PHENERGAN) 25 MG tablet Take 1 tablet (25 mg total) by mouth every 6 (six) hours as needed for nausea or vomiting.   No facility-administered encounter medications  on file as of 12/25/2015.   No Known Allergies Patient Active Problem List   Diagnosis Date Noted  . Dysphagia 11/26/2015  . Conjunctivitis 10/15/2015  . Tonsillitis 10/07/2015  . Lung metastases ([place]) 09/24/2015  . Chemotherapy-induced neuropathy ([place]) 09/24/2015  . Antineoplastic chemotherapy induced pancytopenia ([place]) 09/24/2015  . Liver metastases ([place]) 09/24/2015  .  Mediastinal mass   . Nausea alone 05/20/2014  . Dysuria 05/20/2014  . Breast cancer of lower-outer quadrant of left female breast ([place]) 05/20/2014  . Estrogen deficiency 05/20/2014  . Right leg pain 01/02/2014  . Osteoporosis 04/11/2013  . Neck pain on left side 04/10/2013  . Allergic rhinitis 02/02/2013  . Osteoarthritis 02/02/2013  . Knee pain 02/02/2013  . Sciatica neuralgia 02/02/2013  . Lumbar radicular pain 02/02/2013   Social History   Social History  . Marital Status: Single    Spouse Name: N/A  . Number of Children: N/A  . Years of Education: N/A   Occupational History  . Not on file.   Social History Main Topics  . Smoking status: Never Smoker   . Smokeless tobacco: Not on file  . Alcohol Use: No  . Drug Use: No  . Sexual Activity: Not Currently   Other Topics Concern  . Not on file   Social History Narrative    Ms. Stanger's family history includes Cancer in her father and mother.      Objective:    Filed Vitals:   12/25/15 0930  BP: 100/70  Pulse: 68    Physical Exam  well-developed older [place] female accompanied by her son and husband, she is in a wheelchair and did not speak today. Blood pressure 100/70 pulse 68 O2 sat 98. HEENT: nontraumatic normocephalic EOMI PERRLA sclera anicteric neck supple no JVD no palpable adenopathy, Cardiovascular :regular rate and rhythm with S1-S2 no murmur or gallop, Pulmonary: clear bilaterally, Abdomen :soft nontender nondistended bowel sounds are active, Rectal: exam not done, Neuropsych :patient is alert and did not verbalize.     Assessment & Plan:   #1 70 yo [place] female (nonEnglish speaking),With metastatic breast cancer who had been undergoing chemotherapy and has 2 month history of solid food dysphagia and odynophagia. Recent workup with barium swallow and CT of the chest and abdomen do not show any abnormality of the esophagus. ?neuromuscular or para -neoplastic  #2   2-3 day history of altered  mental status with some disorientation, decrease in verbalization ,and inability to ambulate -MRI of the brain pending-concern for progressive metastatic disease, or other neurologic event  Plan; Long discussion with son regarding changing patient's diet to liquids for liquids and very soft foods only and to avoid any dry foods or large pieces of food meat etc. Have tentatively scheduled an upper endoscopy with Dr. Fuller Plan at [facility] however I did explain to the family that endoscopy may not change our management and should MRI of the brain show progression of her disease that we would cancel the EGD.   Noreta Kue S Alister Staver PA-C 12/25/2015   Cc: Nicholas Lose, MD

## 2015-12-25 NOTE — Patient Instructions (Signed)
Try to eat a very soft diet.   You have been scheduled for an endoscopy. Please follow written instructions given to you at your visit today. If you use inhalers (even only as needed), please bring them with you on the day of your procedure. Your physician has requested that you go to www.startemmi.com and enter the access code given to you at your visit today. This web site gives a general overview about your procedure. However, you should still follow specific instructions given to you by our office regarding your preparation for the procedure.

## 2015-12-26 ENCOUNTER — Telehealth: Payer: Self-pay | Admitting: Pharmacist

## 2015-12-26 NOTE — Telephone Encounter (Signed)
12/26/15: Attempted to reach patient (called patient's son, Julien Nordmann, at home) for follow up on oral medication: Ibrance. No answer. Left VM for patient to call back with any questions or issues. Pt with dysphagia issues. Unsure if patient has issues swallowing Ibrance. Will attempt to discuss with patient's son when he calls back.   Thank you,  Montel Clock, PharmD, [facility] [phone]

## 2015-12-29 ENCOUNTER — Telehealth: Payer: Self-pay

## 2015-12-29 NOTE — Telephone Encounter (Signed)
Pt son calling to inquire when radiation will start.  Inbasket sent to [facility] in [place] for follow up.

## 2015-12-30 ENCOUNTER — Telehealth: Payer: Self-pay | Admitting: *Deleted

## 2015-12-30 ENCOUNTER — Telehealth: Payer: Self-pay | Admitting: Radiation Oncology

## 2015-12-30 ENCOUNTER — Ambulatory Visit
Admission: RE | Admit: 2015-12-30 | Discharge: 2015-12-30 | Disposition: A | Discharge status: Home / Self Care | Payer: Medicaid Other | Source: Ambulatory Visit | Attending: Radiation Oncology | Admitting: Radiation Oncology

## 2015-12-30 ENCOUNTER — Encounter: Payer: Self-pay | Admitting: Radiation Oncology

## 2015-12-30 ENCOUNTER — Telehealth: Payer: Self-pay | Admitting: Oncology

## 2015-12-30 VITALS — BP 124/76 | HR 90 | Temp 97.8°F | Ht 64.0 in

## 2015-12-30 DIAGNOSIS — Z809 Family history of malignant neoplasm, unspecified: Secondary | ICD-10-CM | POA: Diagnosis not present

## 2015-12-30 DIAGNOSIS — M810 Age-related osteoporosis without current pathological fracture: Secondary | ICD-10-CM | POA: Insufficient documentation

## 2015-12-30 DIAGNOSIS — C7931 Secondary malignant neoplasm of brain: Secondary | ICD-10-CM

## 2015-12-30 DIAGNOSIS — Z510 Encounter for antineoplastic radiation therapy: Secondary | ICD-10-CM | POA: Diagnosis present

## 2015-12-30 DIAGNOSIS — K769 Liver disease, unspecified: Secondary | ICD-10-CM | POA: Diagnosis not present

## 2015-12-30 DIAGNOSIS — Z853 Personal history of malignant neoplasm of breast: Secondary | ICD-10-CM | POA: Insufficient documentation

## 2015-12-30 HISTORY — DX: Reserved for concepts with insufficient information to code with codable children: IMO0002

## 2015-12-30 HISTORY — DX: Reserved for inherently not codable concepts without codable children: IMO0001

## 2015-12-30 MED FILL — DEXAMETHASONE 4 MG TABLET: 4 | 20 days supply | Qty: 60 | Fill #0

## 2015-12-30 NOTE — Telephone Encounter (Signed)
Patient's son called wanting to know about appts for radiation. Called and spoke with Suanne Marker in rad/onc who will set up appts and call son.

## 2015-12-30 NOTE — Progress Notes (Signed)
Radiation Oncology         [phone] ________________________________  Re-evaluation note  Name: Michele Hale MRN: [id number]  Date: 12/30/2015  DOB: Jan 01, 1946  [id number], Gabrielle Dare, MD  Tresa Garter, MD   REFERRING PHYSICIAN: Tresa Garter, MD  DIAGNOSIS: The encounter diagnosis was Brain metastasis ([place]).  HISTORY OF PRESENT ILLNESS::Michele Hale is a 70 y.o. female who is seen out courtesy of Dr. Lindi Adie for consideration for additional radiation therapy.. Patient has a history of metastatic breast cancer with a large mediastinal mass supraclavicular lymphadenopathy and liver lesions. She has been under the care Dr Lindi Adie. Most recently family members had noticed some confusion and difficulty with ambulation. A MRI the brain was performed which showed extensive metastasis in the infratentorial and supratentorial sections of the brain. The patient is now referred to radiation oncology for consideration for cranial radiation.Marland Kitchen  PREVIOUS RADIATION THERAPY: Yes patient received breast conservation therapy for her initial diagnosis  to a left breast cancer. The patient completed her radiation therapy 02/17/2011. She received 50 gray in 25 fractions directed at the left breast.  The patient's treatment was under the direction of Dr. Pablo Ledger  PAST MEDICAL HISTORY:  has a past medical history of Abscess; IBS (irritable bowel syndrome); Breast mass; Osteoporosis; Cancer ([place]); and Radiation (01/13/2011-02/17/2011).    PAST SURGICAL HISTORY: Past Surgical History  Procedure Laterality Date  . Cesarean section    . Breast lumpectomy      lt breast    FAMILY HISTORY: family history includes Cancer in her father and mother.  SOCIAL HISTORY:  reports that she has never smoked. She does not have any smokeless tobacco history on file. She reports that she does not drink alcohol or use illicit drugs.  ALLERGIES: Review of patient's allergies indicates no known  allergies.  MEDICATIONS:  Current Outpatient Prescriptions  Medication Sig Dispense Refill  . acetaminophen (TYLENOL) 500 MG tablet Take 1,000 mg by mouth every 6 (six) hours as needed. For arthritis pain    . calcium-vitamin D (OSCAL WITH D) 500-200 MG-UNIT per tablet Take 1 tablet by mouth 2 (two) times daily.    Marland Kitchen ibuprofen (ADVIL,MOTRIN) 800 MG tablet Take 1 tablet (800 mg total) by mouth every 8 (eight) hours as needed. 30 tablet 3  . lidocaine-prilocaine (EMLA) cream Apply to port-a-cath one and a half hours before use.  Cover cream with plastic wrap to seal. 30 g 3  . loratadine (CLARITIN) 10 MG tablet Take 1 tablet (10 mg total) by mouth daily. 30 tablet 3  . magnesium oxide (MAG-OX) 400 (241.3 MG) MG tablet Take 1 tablet (400 mg total) by mouth daily. 30 tablet 3  . palbociclib (IBRANCE) 125 MG capsule Take 1 capsule (125 mg total) by mouth daily with breakfast. Take whole with food. 21 capsule 0  . promethazine (PHENERGAN) 25 MG tablet Take 1 tablet (25 mg total) by mouth every 6 (six) hours as needed for nausea or vomiting. 30 tablet 6  . cholecalciferol (VITAMIN D) 1000 UNITS tablet Take 1 tablet (1,000 Units total) by mouth daily. (Patient not taking: Reported on 12/30/2015) 30 tablet 12  . dexamethasone (DECADRON) 4 MG tablet Take 4 mg by mouth 3 (three) times daily. Called in to [facility] on 12/30/15.  Disp. 60 tablets. 0 refills.    Marland Kitchen HYDROcodone-acetaminophen (NORCO/VICODIN) 5-325 MG per tablet Take 1-2 tablets by mouth every 4 (four) hours as needed. (Patient taking differently: Take 1-2 tablets by mouth every 4 (four) hours  as needed for moderate pain. ) 10 tablet 0  . levofloxacin (LEVAQUIN) 500 MG tablet Take 1 tablet (500 mg total) by mouth daily. (Patient not taking: Reported on 12/30/2015) 7 tablet 0   No current facility-administered medications for this encounter.    REVIEW OF SYSTEMS:  A 15 point review of systems is documented in the electronic medical  record. This was obtained by the nursing staff. However, I reviewed this with the patient to discuss relevant findings and make appropriate changes. Positive for staggering gait as reported by family members, intermittent headaches difficulty with hand coordination, shaky hands, right greater weakness, dizziness when she lowers her head confusion and disorientation to place.   PHYSICAL EXAM:  height is 5\' 4"  (1.626 m). Her temperature is 97.8 F (36.6 C). Her blood pressure is 124/76 and her pulse is 90. Her oxygen saturation is 100%.   General: Sitting comfortably in wheelchair. The patient is nonverbal, does not understand English, accompanied by 2 sons and one daughter-in-law, one son is able to communicate with the patient and he understands English well HEENT: Head is normocephalic. Extraocular movements are intact. Oropharynx is clear. Neck: Neck is supple,  Heart: Regular in rate and rhythm with no murmurs, rubs, or gallops. Chest: Clear to auscultation bilaterally, with no rhonchi, wheezes, or rales. Abdomen: Soft, nontender, nondistended, with no rigidity or guarding Neurologic: Decreased grip strength along the right side. Possibly some mild right sided weakness in the lower extremity  ECOG = 3  LABORATORY DATA:  Lab Results  Component Value Date   WBC 5.3 12/17/2015   HGB 11.6 12/17/2015   HCT 36.2 12/17/2015   MCV 86.8 12/17/2015   PLT 190 12/17/2015   NEUTROABS 4.0 12/17/2015   Lab Results  Component Value Date   NA 141 12/17/2015   K 4.5 12/17/2015   CL 102 02/21/2015   CO2 28 12/17/2015   GLUCOSE 106 12/17/2015   CREATININE 0.7 12/17/2015   CALCIUM 9.7 12/17/2015      RADIOGRAPHY: Ct Soft Tissue Neck W Contrast  12/15/2015  CLINICAL DATA:  Left metastatic breast cancer diagnosed in 2011. Ongoing chemotherapy. Difficulty swallowing for 2 weeks. EXAM: CT NECK WITH CONTRAST TECHNIQUE: Multidetector CT imaging of the neck was performed using the standard protocol  following the bolus administration of intravenous contrast. CONTRAST:  34mL OMNIPAQUE IOHEXOL 300 MG/ML SOLN, 152mL OMNIPAQUE IOHEXOL 300 MG/ML SOLN COMPARISON:  PET-CT 02/28/2015 FINDINGS: Pharynx and larynx: Asymmetric larynx. The left arytenoid is medialized. No evidence of pharyngeal based mass lesion. Salivary glands: Negative Thyroid: Numerous small bilateral nodules, considered incidental. Lymph nodes: Enlarged nodes at diagnosis have significantly decreased in size. No enlarged necrotic appearing lymph nodes in the neck. No mass displacing the cervical esophagus. Vascular: Atherosclerosis without evidence of flow limiting stenosis. Limited intracranial: 6 mm focus of enhancement along the floor of the fourth ventricle, left of midline. No associated edema or spur thick ventricular effacement. Visualized orbits: Negative Mastoids and visualized paranasal sinuses: Clear Skeleton: No acute finding or aggressive lesion. Multilevel advanced facet arthropathy Upper chest: Reported separately These results will be called to the ordering clinician or representative by the Radiologist Assistant, and communication documented in the PACS or zVision Dashboard. IMPRESSION: 1. Questionable 6 mm mass in the left pons. Recommend brain MRI to evaluate for metastatic disease. 2. Asymmetric larynx, possible cord paresis on the left. 3. Supraclavicular/low cervical metastatic adenopathy seen in 2016 has improved. Currently no enlarged cervical lymph nodes. 4. Chest reported separately. Electronically Signed  By: Monte Fantasia M.D.   On: 12/15/2015 10:28   Ct Chest W Contrast  12/15/2015  CLINICAL DATA:  70 year old female with history of left-sided breast cancer diagnosed in 2011 with none metastatic disease. Ongoing chemotherapy. Difficulty swallowing for the past 2 weeks. EXAM: CT CHEST, ABDOMEN, AND PELVIS WITH CONTRAST TECHNIQUE: Multidetector CT imaging of the chest, abdomen and pelvis was performed following the  standard protocol during bolus administration of intravenous contrast. CONTRAST:  33mL OMNIPAQUE IOHEXOL 300 MG/ML SOLN, 19mL OMNIPAQUE IOHEXOL 300 MG/ML SOLN COMPARISON:  CT the chest, abdomen and pelvis 09/22/2015. PET-CT 07/18/2015. FINDINGS: CT CHEST FINDINGS Mediastinum/Lymph Nodes: Further increase in metastatic lymphadenopathy throughout the mediastinum, most evident in the anterior mediastinum where the largest enhancing nodal mass measures up to 3.5 x 6.1 cm (image 18 of series 2), previously only 2.7 x 4.6 cm on 09/22/2015. This lesion is contiguous with the left internal mammary nodal chain, which also demonstrates some enlarged lymph nodes. Marked enlargement of a low right paratracheal lymph node which currently measures 1.7 cm in short axis (previously nonenlarged). Heart size is borderline enlarged. There is no significant pericardial fluid, thickening or pericardial calcification. There is atherosclerosis of the thoracic aorta, the great vessels of the mediastinum and the coronary arteries, including calcified atherosclerotic plaque in the left main, left anterior descending and right coronary arteries. Mild calcifications of the mitral annulus and aortic valve. Esophagus is unremarkable in appearance. No axillary lymphadenopathy. Right-sided internal jugular single-lumen porta cath with tip terminating at the superior cavoatrial junction. Lungs/Pleura: No suspicious appearing pulmonary nodules or masses. No acute consolidative airspace disease. No pleural effusions. Musculoskeletal/Soft Tissues: Soft tissue mass in the central aspect of the left breast measuring up to 2.9 x 2.2 cm (image 35 of series 2), similar to prior examination. Some overlying skin thickening is noted. Irregular sclerosis and lucency is again noted in the manubrium, similar to the prior examination. There is also a small lucent lesion in the left side of the sternum measuring 7 mm (image 23 of series 7) which is similar to  prior examinations. CT ABDOMEN AND PELVIS FINDINGS Hepatobiliary: No suspicious cystic or solid hepatic lesions. No intra or extrahepatic biliary ductal dilatation. Gallbladder is normal in appearance. Pancreas: No pancreatic mass. No pancreatic ductal dilatation. No pancreatic or peripancreatic fluid or inflammatory changes. Spleen: Unremarkable. Adrenals/Urinary Tract: Several sub cm low-attenuation lesions are noted in the kidneys bilaterally, similar to prior examinations. Although these are too small to definitively characterize, these are presumably small cysts. Bilateral adrenal glands are normal in appearance. No hydroureteronephrosis. Urinary bladder is nearly decompressed, but otherwise unremarkable in appearance. Stomach/Bowel: Normal appearance of the stomach. No pathologic dilatation of small bowel or colon. Normal appendix. Vascular/Lymphatic: Atherosclerosis throughout the abdominal and pelvic vasculature, without evidence of aneurysm or dissection. No lymphadenopathy noted in the abdomen or pelvis. Reproductive: Uterus and ovaries are atrophic. Other: No significant volume of ascites.  No pneumoperitoneum. Musculoskeletal: New osteolysis involving the superior aspect of the right ilium with some extension of tumor into the overlying right gluteal and iliacus musculature. 12 mm of anterolisthesis of L5 upon S1. Bilateral pars defects at L5. IMPRESSION: 1. Today's study demonstrates progression of metastatic disease, with interval increase in mediastinal lymphadenopathy, and new development of a large lytic lesion in the superior aspect of the right ilium. 2. 2.9 x 2.2 cm mass in the central aspect of the left breast is similar to prior examinations. 3. Irregularities in the manubrium and sternum, similar to prior  examinations, potentially either treatment related or treated metastatic disease. 4. Additional incidental findings, similar to prior examinations, as above. Electronically Signed   By:  Vinnie Langton M.D.   On: 12/15/2015 10:37   Mr Jeri Cos [id number] Contrast  12/24/2015  CLINICAL DATA:  Altered mental status. Noncommunicating. History of breast cancer, assess for metastasis. EXAM: MRI HEAD WITHOUT AND WITH CONTRAST TECHNIQUE: Multiplanar, multiecho pulse sequences of the brain and surrounding structures were obtained without and with intravenous contrast. CONTRAST:  61mL MULTIHANCE GADOBENATE DIMEGLUMINE 529 MG/ML IV SOLN COMPARISON:  CT neck December 23, 2015 FINDINGS: At least 20 infratentorial and greater than 20 supratentorial enhancing parenchymal masses, including 12 mm mass prior pontomedullary junction, multiple subcentimeter masses in the bilateral thalamus, bilateral basal ganglia and, at gray-white matter junction, extending into the deep white matter. 1 cm mass in LEFT anterior cranial fossa and may be extra-axial, sagittal 19/24. Largest supratentorial mass in LEFT frontal lobe measuring up to 18 mm. 11 mm mass RIGHT parasagittal splenium of corpus callosum. Masses demonstrate reduced diffusion, in T2 shine through. No susceptibility artifact to suggest hemorrhage. Masses demonstrate bright FLAIR T2 hyperintense vasogenic edema without significant mass effect. No midline shift. No reduced diffusion to suggest acute ischemia. Ventricles and sulci are generally normal for patient's age. No abnormal extra-axial fluid collections. No extra-axial masses nor leptomeningeal enhancement. Normal major intracranial vascular flow voids seen at the skull base. Ocular globes and orbital contents are unremarkable though not tailored for evaluation. No suspicious calvarial bone marrow signal. No abnormal sellar expansion. Craniocervical junction maintained. Visualized paranasal sinuses and mastoid air cells are well-aerated. IMPRESSION: At least 20 infratentorial and greater than 20 relatively small supratentorial intraparenchymal metastasis. Local mass effect without midline shift. 1 cm LEFT anterior  cranial fossa possible dural based metastasis. Electronically Signed   By: Elon Alas M.D.   On: 12/24/2015 22:27   Ct Abdomen Pelvis W Contrast  12/15/2015  CLINICAL DATA:  70 year old female with history of left-sided breast cancer diagnosed in 2011 with none metastatic disease. Ongoing chemotherapy. Difficulty swallowing for the past 2 weeks. EXAM: CT CHEST, ABDOMEN, AND PELVIS WITH CONTRAST TECHNIQUE: Multidetector CT imaging of the chest, abdomen and pelvis was performed following the standard protocol during bolus administration of intravenous contrast. CONTRAST:  66mL OMNIPAQUE IOHEXOL 300 MG/ML SOLN, 144mL OMNIPAQUE IOHEXOL 300 MG/ML SOLN COMPARISON:  CT the chest, abdomen and pelvis 09/22/2015. PET-CT 07/18/2015. FINDINGS: CT CHEST FINDINGS Mediastinum/Lymph Nodes: Further increase in metastatic lymphadenopathy throughout the mediastinum, most evident in the anterior mediastinum where the largest enhancing nodal mass measures up to 3.5 x 6.1 cm (image 18 of series 2), previously only 2.7 x 4.6 cm on 09/22/2015. This lesion is contiguous with the left internal mammary nodal chain, which also demonstrates some enlarged lymph nodes. Marked enlargement of a low right paratracheal lymph node which currently measures 1.7 cm in short axis (previously nonenlarged). Heart size is borderline enlarged. There is no significant pericardial fluid, thickening or pericardial calcification. There is atherosclerosis of the thoracic aorta, the great vessels of the mediastinum and the coronary arteries, including calcified atherosclerotic plaque in the left main, left anterior descending and right coronary arteries. Mild calcifications of the mitral annulus and aortic valve. Esophagus is unremarkable in appearance. No axillary lymphadenopathy. Right-sided internal jugular single-lumen porta cath with tip terminating at the superior cavoatrial junction. Lungs/Pleura: No suspicious appearing pulmonary nodules or  masses. No acute consolidative airspace disease. No pleural effusions. Musculoskeletal/Soft Tissues: Soft tissue mass in the central  aspect of the left breast measuring up to 2.9 x 2.2 cm (image 35 of series 2), similar to prior examination. Some overlying skin thickening is noted. Irregular sclerosis and lucency is again noted in the manubrium, similar to the prior examination. There is also a small lucent lesion in the left side of the sternum measuring 7 mm (image 23 of series 7) which is similar to prior examinations. CT ABDOMEN AND PELVIS FINDINGS Hepatobiliary: No suspicious cystic or solid hepatic lesions. No intra or extrahepatic biliary ductal dilatation. Gallbladder is normal in appearance. Pancreas: No pancreatic mass. No pancreatic ductal dilatation. No pancreatic or peripancreatic fluid or inflammatory changes. Spleen: Unremarkable. Adrenals/Urinary Tract: Several sub cm low-attenuation lesions are noted in the kidneys bilaterally, similar to prior examinations. Although these are too small to definitively characterize, these are presumably small cysts. Bilateral adrenal glands are normal in appearance. No hydroureteronephrosis. Urinary bladder is nearly decompressed, but otherwise unremarkable in appearance. Stomach/Bowel: Normal appearance of the stomach. No pathologic dilatation of small bowel or colon. Normal appendix. Vascular/Lymphatic: Atherosclerosis throughout the abdominal and pelvic vasculature, without evidence of aneurysm or dissection. No lymphadenopathy noted in the abdomen or pelvis. Reproductive: Uterus and ovaries are atrophic. Other: No significant volume of ascites.  No pneumoperitoneum. Musculoskeletal: New osteolysis involving the superior aspect of the right ilium with some extension of tumor into the overlying right gluteal and iliacus musculature. 12 mm of anterolisthesis of L5 upon S1. Bilateral pars defects at L5. IMPRESSION: 1. Today's study demonstrates progression of  metastatic disease, with interval increase in mediastinal lymphadenopathy, and new development of a large lytic lesion in the superior aspect of the right ilium. 2. 2.9 x 2.2 cm mass in the central aspect of the left breast is similar to prior examinations. 3. Irregularities in the manubrium and sternum, similar to prior examinations, potentially either treatment related or treated metastatic disease. 4. Additional incidental findings, similar to prior examinations, as above. Electronically Signed   By: Vinnie Langton M.D.   On: 12/15/2015 10:37   Dg Esophagus  12/15/2015  CLINICAL DATA:  Dysphagia with solids. History of breast cancer status post lumpectomy and radiation. EXAM: ESOPHOGRAM / BARIUM SWALLOW / BARIUM TABLET STUDY TECHNIQUE: Combined double contrast and single contrast examination performed using effervescent crystals, thick barium liquid, and thin barium liquid. The patient was observed with fluoroscopy swallowing a 13 mm barium sulphate tablet. FLUOROSCOPY TIME:  Radiation Exposure Index (as provided by the fluoroscopic device): 24.62 d Gy cm2 COMPARISON:  None. FINDINGS: Normal esophageal peristalsis. No fixed esophageal narrowing or stricture. A 13 mm barium tablet passed into the stomach without delay. Small hiatal hernia. Patient could not tolerate prone swallows. Gastroesophageal reflux could not be assessed. IMPRESSION: No fixed esophageal narrowing or stricture. Small hiatal hernia. Electronically Signed   By: Julian Hy M.D.   On: 12/15/2015 12:33      IMPRESSION: Metastatic breast cancer now with diagnosis of diffuse brain metastasis. The patient will be a good candidate for whole brain radiation therapy. I discussed treatment course side effects and potential toxicities of radiation therapy in this situation with the patient as well as family members who served as interpreter. The patient's son signed the consent form. I discussed with the family that treatment is palliative  and her prognosis is relatively poor but they do wish that she proceed with radiation treatment.  PLAN: Simulation and planning March 8 with treatments to begin March 9. I anticipate between 10 and 14 treatments directed at the whole  brain. Today the patient was given a prescription for Decadron and will take 4 mg 3 times a day with eventual taper after she completes her whole brain radiation therapy.     ------------------------------------------------  Blair Promise, PhD, MD

## 2015-12-30 NOTE — Telephone Encounter (Addendum)
Called in to [facility] per Dr. Sondra Come for dexamethasone (DECADRON) 4 MG tablet -Take 4 mg by mouth 3 (three) times daily, disp 60 tablets with 0 refills.

## 2015-12-30 NOTE — Progress Notes (Signed)
Please see the Nurse Progress Note in the MD Initial Consult Encounter for this patient. 

## 2015-12-30 NOTE — Telephone Encounter (Signed)
Left voicemail to call back and set up appointment, Dr Sondra Come wants patient to come later this afternoon, or sometime tomorrow, treatment needs to start asap, 12/30/15 Ardeen Fillers)

## 2015-12-30 NOTE — Progress Notes (Signed)
Location/Histology of Brain Tumor: MR from 12/24/15 showed "At least 20 infratentorial and greater than 20 relatively small supratentorial intraparenchymal metastasis. Local mass effect without midline shift. 1 cm LEFT anterior cranial fossa possible dural based metastasis."  Patient presented with symptoms of:  Memory loss  Past or anticipated interventions, if any, per neurosurgery: None.  Had MRI on 12/24/15  Past or anticipated interventions, if any, per medical oncology: neoadjuvant antiestrogen therapy, antiestrogen therapy with Femara 2.5 mg daily started 02/03/2011. Halaven started 03/19/15; HalavenCycle 13 (treatment being changed to days 1 and 8 every 3 weeks because of progression noted on the CT scan 09/14/2015). Ibrance with Faslodex and Xgeva started 12/17/15.   Dose of Decadron, if applicable: takes 0.5 mg daily  Recent neurologic symptoms, if any:   Seizures: None  Headaches: Intermittent headaches  Nausea: None  Dizziness/ataxia: Staggering Gait as reported by family  Difficulty with hand coordination:shaky hands  Focal numbness/weakness: Mild weakness right grip  Visual deficits/changes: dizziness when she lowers her head- intermittent  Confusion/Memory deficits: yes. Family reports  disorientation to place  Painful bone metastases at present, if any: mediastinum mass, new large lytic lesion Rt ileum  SAFETY ISSUES:  Prior radiation?01/13/2011-02/17/2011 left breast 50 Gy    Pacemaker/ICD? No  Possible current pregnancy? no  Is the patient on methotrexate? No   Additional Complaints / other details:   Constipation presently.  Note wheezing when sleeping.  Vitals stable.

## 2015-12-31 ENCOUNTER — Telehealth: Payer: Self-pay | Admitting: *Deleted

## 2015-12-31 ENCOUNTER — Ambulatory Visit
Admission: RE | Admit: 2015-12-31 | Discharge: 2015-12-31 | Disposition: A | Discharge status: Home / Self Care | Payer: Medicaid Other | Source: Ambulatory Visit | Attending: Radiation Oncology | Admitting: Radiation Oncology

## 2015-12-31 ENCOUNTER — Other Ambulatory Visit: Payer: Self-pay | Admitting: *Deleted

## 2015-12-31 ENCOUNTER — Encounter: Payer: Self-pay | Admitting: *Deleted

## 2015-12-31 DIAGNOSIS — C50512 Malignant neoplasm of lower-outer quadrant of left female breast: Secondary | ICD-10-CM

## 2015-12-31 DIAGNOSIS — Z510 Encounter for antineoplastic radiation therapy: Secondary | ICD-10-CM | POA: Diagnosis not present

## 2015-12-31 DIAGNOSIS — C7801 Secondary malignant neoplasm of right lung: Secondary | ICD-10-CM

## 2015-12-31 DIAGNOSIS — C787 Secondary malignant neoplasm of liver and intrahepatic bile duct: Secondary | ICD-10-CM

## 2015-12-31 DIAGNOSIS — C7931 Secondary malignant neoplasm of brain: Secondary | ICD-10-CM

## 2015-12-31 NOTE — Progress Notes (Signed)
  Radiation Oncology         [phone] ________________________________  Name: Creta Bartch MRN: [id number]  Date: 12/31/2015  DOB: Jun 25, 1946  SIMULATION AND TREATMENT PLANNING NOTE    ICD-9-CM ICD-10-CM   1. Brain metastasis ([place]) 198.3 C79.31     DIAGNOSIS:  Metastatic breast cancer now with diagnosis of diffuse brain metastasis  NARRATIVE:  The patient was brought to the [facility].  Identity was confirmed.  All relevant records and images related to the planned course of therapy were reviewed.  The patient freely provided informed written consent to proceed with treatment after reviewing the details related to the planned course of therapy. The consent form was witnessed and verified by the simulation staff.  Then, the patient was set-up in a stable reproducible  supine position for radiation therapy.  CT images were obtained.  Surface markings were placed.  The CT images were loaded into the planning software.  Then the target and avoidance structures were contoured.  Treatment planning then occurred.  The radiation prescription was entered and confirmed.  Then, I designed and supervised the construction of a total of 3 medically necessary complex treatment devices.  I have requested : Isodose Plan.  I have ordered:dose calc.  PLAN:  The patient will receive 30 Gy in 10 fractions.  ________________________________  -----------------------------------  Blair Promise, PhD, MD

## 2015-12-31 NOTE — Telephone Encounter (Signed)
Post sending referral for home health and contacting in house cordinator this RN received a VM from [place] stating diagnosis code does not qualify pt for home PT/OT per medicaid guidelines.  May  Return call to him at [phone] or speak with liason at [facility] upon her return tomorrow.  Above will be forwarded to RN working with MD for follow up on next business day.

## 2015-12-31 NOTE — Progress Notes (Signed)
Michele Hale  Clinical Social Hale met with Michele Hale who was referred by radonc for help with a wheelchair. Pt appears to need additional assistance due to gait issues. CSW suggested to [facility] RN referral for wheelchair, bedside commode, PT/OT eval at home. Pt lives with Hale and they report family can assist in the home. Hale denied other needs, but was appreciative of support.  CSW gave Hale CSW contact info if other needs arise.   Clinical Social Hale interventions: Care coordination  Michele Hale, [place] Worker [place]  [place] Phone: [phone] Fax: [phone]

## 2015-12-31 NOTE — Telephone Encounter (Signed)
This RN obtained order for PT and OT with DME from MD per pt's visit today with noted balance and ambulation difficulties.   Concern is for high risk for injury due to above which is secondary

## 2016-01-01 ENCOUNTER — Other Ambulatory Visit: Payer: Self-pay

## 2016-01-01 ENCOUNTER — Encounter: Payer: Self-pay | Admitting: *Deleted

## 2016-01-01 ENCOUNTER — Ambulatory Visit
Admission: RE | Admit: 2016-01-01 | Discharge: 2016-01-01 | Disposition: A | Discharge status: Home / Self Care | Payer: Medicaid Other | Source: Ambulatory Visit | Attending: Radiation Oncology | Admitting: Radiation Oncology

## 2016-01-01 DIAGNOSIS — Z510 Encounter for antineoplastic radiation therapy: Secondary | ICD-10-CM | POA: Diagnosis not present

## 2016-01-01 DIAGNOSIS — C7931 Secondary malignant neoplasm of brain: Secondary | ICD-10-CM

## 2016-01-01 NOTE — Progress Notes (Signed)
  Radiation Oncology         [phone] ________________________________  Name: Michele Hale MRN: [id number]  Date: 01/01/2016  DOB: 1945-11-09  Simulation Verification Note    ICD-9-CM ICD-10-CM   1. Brain metastasis ([place]) 198.3 C79.31     Status: outpatient  NARRATIVE: The patient was brought to the treatment unit and placed in the planned treatment position. The clinical setup was verified. Then port films were obtained and uploaded to the radiation oncology medical record software.  The treatment beams were carefully compared against the planned radiation fields. The position location and shape of the radiation fields was reviewed. They targeted volume of tissue appears to be appropriately covered by the radiation beams. Organs at risk appear to be excluded as planned.  Based on my personal review, I approved the simulation verification. The patient's treatment will proceed as planned.  -----------------------------------  Blair Promise, PhD, MD

## 2016-01-01 NOTE — Progress Notes (Signed)
[place] Psychosocial Distress Screening Clinical Social Work  Clinical Social Work was referred by distress screening protocol.  The patient scored a 7 on the Psychosocial Distress Thermometer which indicates severe distress. Clinical Social Worker met with son on 12/31/15 and addressed needs for help at home. Referral to [facility] RN, PT/OT was placed by RN on 12/31/15 as well. Pt could not speak English, thus CSW communicated with son.   ONCBCN DISTRESS SCREENING 12/30/2015  Screening Type Initial Screening  Distress experienced in past week (1-10) 7  Physical Problem type Getting around;Bathing/dressing;Constipation/diarrhea    Clinical Social Worker follow up needed: No.  If yes, follow up plan:  Loren Racer, [place]  [facility] Phone: [phone] Fax: [phone]

## 2016-01-02 ENCOUNTER — Ambulatory Visit
Admission: RE | Admit: 2016-01-02 | Discharge: 2016-01-02 | Disposition: A | Discharge status: Home / Self Care | Payer: Medicaid Other | Source: Ambulatory Visit | Attending: Radiation Oncology | Admitting: Radiation Oncology

## 2016-01-02 ENCOUNTER — Other Ambulatory Visit: Payer: Self-pay

## 2016-01-02 ENCOUNTER — Ambulatory Visit: Payer: Medicaid Other | Admitting: Radiation Oncology

## 2016-01-02 DIAGNOSIS — C787 Secondary malignant neoplasm of liver and intrahepatic bile duct: Secondary | ICD-10-CM

## 2016-01-02 DIAGNOSIS — C50512 Malignant neoplasm of lower-outer quadrant of left female breast: Secondary | ICD-10-CM

## 2016-01-02 DIAGNOSIS — Z510 Encounter for antineoplastic radiation therapy: Secondary | ICD-10-CM | POA: Diagnosis not present

## 2016-01-02 DIAGNOSIS — C7931 Secondary malignant neoplasm of brain: Secondary | ICD-10-CM

## 2016-01-02 DIAGNOSIS — C7801 Secondary malignant neoplasm of right lung: Secondary | ICD-10-CM | POA: Insufficient documentation

## 2016-01-02 NOTE — Progress Notes (Signed)
LMOVM on identified voice mail for pt son: [place] Nurse will be calling to make appt - Medicaid requires nursing assessment prior to being able to due anything with PT/ OT or equipment.  Pt son to call clinic with any questions.

## 2016-01-05 ENCOUNTER — Emergency Department (HOSPITAL_COMMUNITY)

## 2016-01-05 ENCOUNTER — Encounter: Payer: Self-pay | Admitting: *Deleted

## 2016-01-05 ENCOUNTER — Ambulatory Visit
Admission: RE | Admit: 2016-01-05 | Discharge: 2016-01-05 | Disposition: A | Discharge status: Home / Self Care | Payer: Medicaid Other | Source: Ambulatory Visit | Attending: Radiation Oncology | Admitting: Radiation Oncology

## 2016-01-05 ENCOUNTER — Encounter (HOSPITAL_COMMUNITY): Payer: Self-pay | Admitting: Nurse Practitioner

## 2016-01-05 ENCOUNTER — Other Ambulatory Visit: Payer: Self-pay

## 2016-01-05 ENCOUNTER — Encounter: Payer: Self-pay | Admitting: Radiation Oncology

## 2016-01-05 ENCOUNTER — Telehealth: Payer: Self-pay | Admitting: Oncology

## 2016-01-05 ENCOUNTER — Telehealth: Payer: Self-pay | Admitting: *Deleted

## 2016-01-05 ENCOUNTER — Inpatient Hospital Stay (HOSPITAL_COMMUNITY)
Admission: EM | Admit: 2016-01-05 | Discharge: 2016-01-24 | DRG: 208 | Disposition: E | Attending: Internal Medicine | Admitting: Internal Medicine

## 2016-01-05 ENCOUNTER — Other Ambulatory Visit: Payer: Self-pay | Admitting: *Deleted

## 2016-01-05 VITALS — BP 131/89 | HR 83 | Temp 98.5°F

## 2016-01-05 DIAGNOSIS — Z800 Family history of malignant neoplasm of digestive organs: Secondary | ICD-10-CM | POA: Diagnosis not present

## 2016-01-05 DIAGNOSIS — Z923 Personal history of irradiation: Secondary | ICD-10-CM

## 2016-01-05 DIAGNOSIS — C787 Secondary malignant neoplasm of liver and intrahepatic bile duct: Secondary | ICD-10-CM | POA: Diagnosis present

## 2016-01-05 DIAGNOSIS — J9601 Acute respiratory failure with hypoxia: Secondary | ICD-10-CM | POA: Diagnosis present

## 2016-01-05 DIAGNOSIS — R0602 Shortness of breath: Secondary | ICD-10-CM

## 2016-01-05 DIAGNOSIS — T380X5A Adverse effect of glucocorticoids and synthetic analogues, initial encounter: Secondary | ICD-10-CM | POA: Diagnosis present

## 2016-01-05 DIAGNOSIS — E46 Unspecified protein-calorie malnutrition: Secondary | ICD-10-CM | POA: Diagnosis present

## 2016-01-05 DIAGNOSIS — M810 Age-related osteoporosis without current pathological fracture: Secondary | ICD-10-CM | POA: Diagnosis present

## 2016-01-05 DIAGNOSIS — R402432 Glasgow coma scale score 3-8, at arrival to emergency department: Secondary | ICD-10-CM | POA: Diagnosis present

## 2016-01-05 DIAGNOSIS — R739 Hyperglycemia, unspecified: Secondary | ICD-10-CM | POA: Diagnosis present

## 2016-01-05 DIAGNOSIS — G9341 Metabolic encephalopathy: Secondary | ICD-10-CM | POA: Diagnosis present

## 2016-01-05 DIAGNOSIS — Z66 Do not resuscitate: Secondary | ICD-10-CM | POA: Diagnosis not present

## 2016-01-05 DIAGNOSIS — D61810 Antineoplastic chemotherapy induced pancytopenia: Secondary | ICD-10-CM | POA: Diagnosis present

## 2016-01-05 DIAGNOSIS — C7951 Secondary malignant neoplasm of bone: Secondary | ICD-10-CM | POA: Diagnosis present

## 2016-01-05 DIAGNOSIS — R1310 Dysphagia, unspecified: Secondary | ICD-10-CM | POA: Diagnosis not present

## 2016-01-05 DIAGNOSIS — T451X5A Adverse effect of antineoplastic and immunosuppressive drugs, initial encounter: Secondary | ICD-10-CM | POA: Diagnosis present

## 2016-01-05 DIAGNOSIS — A419 Sepsis, unspecified organism: Secondary | ICD-10-CM | POA: Diagnosis present

## 2016-01-05 DIAGNOSIS — Z79899 Other long term (current) drug therapy: Secondary | ICD-10-CM

## 2016-01-05 DIAGNOSIS — R092 Respiratory arrest: Secondary | ICD-10-CM | POA: Diagnosis not present

## 2016-01-05 DIAGNOSIS — D61818 Other pancytopenia: Secondary | ICD-10-CM | POA: Diagnosis not present

## 2016-01-05 DIAGNOSIS — C7801 Secondary malignant neoplasm of right lung: Secondary | ICD-10-CM | POA: Diagnosis present

## 2016-01-05 DIAGNOSIS — T85598A Other mechanical complication of other gastrointestinal prosthetic devices, implants and grafts, initial encounter: Secondary | ICD-10-CM

## 2016-01-05 DIAGNOSIS — R64 Cachexia: Secondary | ICD-10-CM | POA: Diagnosis present

## 2016-01-05 DIAGNOSIS — K589 Irritable bowel syndrome without diarrhea: Secondary | ICD-10-CM | POA: Diagnosis present

## 2016-01-05 DIAGNOSIS — Z7189 Other specified counseling: Secondary | ICD-10-CM | POA: Insufficient documentation

## 2016-01-05 DIAGNOSIS — R410 Disorientation, unspecified: Secondary | ICD-10-CM | POA: Diagnosis present

## 2016-01-05 DIAGNOSIS — G9340 Encephalopathy, unspecified: Secondary | ICD-10-CM | POA: Diagnosis not present

## 2016-01-05 DIAGNOSIS — J690 Pneumonitis due to inhalation of food and vomit: Secondary | ICD-10-CM | POA: Diagnosis present

## 2016-01-05 DIAGNOSIS — C50512 Malignant neoplasm of lower-outer quadrant of left female breast: Secondary | ICD-10-CM

## 2016-01-05 DIAGNOSIS — Z515 Encounter for palliative care: Secondary | ICD-10-CM | POA: Diagnosis present

## 2016-01-05 DIAGNOSIS — R0681 Apnea, not elsewhere classified: Secondary | ICD-10-CM

## 2016-01-05 DIAGNOSIS — Z6823 Body mass index (BMI) 23.0-23.9, adult: Secondary | ICD-10-CM | POA: Diagnosis not present

## 2016-01-05 DIAGNOSIS — I959 Hypotension, unspecified: Secondary | ICD-10-CM | POA: Diagnosis present

## 2016-01-05 DIAGNOSIS — E860 Dehydration: Secondary | ICD-10-CM | POA: Diagnosis present

## 2016-01-05 DIAGNOSIS — C7931 Secondary malignant neoplasm of brain: Secondary | ICD-10-CM | POA: Diagnosis present

## 2016-01-05 DIAGNOSIS — J9690 Respiratory failure, unspecified, unspecified whether with hypoxia or hypercapnia: Secondary | ICD-10-CM

## 2016-01-05 DIAGNOSIS — R0600 Dyspnea, unspecified: Secondary | ICD-10-CM | POA: Diagnosis not present

## 2016-01-05 DIAGNOSIS — C50912 Malignant neoplasm of unspecified site of left female breast: Secondary | ICD-10-CM | POA: Diagnosis present

## 2016-01-05 DIAGNOSIS — G9349 Other encephalopathy: Secondary | ICD-10-CM | POA: Diagnosis present

## 2016-01-05 DIAGNOSIS — C50919 Malignant neoplasm of unspecified site of unspecified female breast: Secondary | ICD-10-CM | POA: Diagnosis not present

## 2016-01-05 HISTORY — DX: Secondary malignant neoplasm of brain: C79.31

## 2016-01-05 LAB — BLOOD GAS, ARTERIAL
Acid-base deficit: 1.6 mmol/L (ref 0.0–2.0)
BICARBONATE: 22.2 meq/L (ref 20.0–24.0)
Drawn by: 422461
FIO2: 0.21
O2 SAT: 94.3 %
PCO2 ART: 35.6 mmHg (ref 35.0–45.0)
PH ART: 7.41 (ref 7.350–7.450)
PO2 ART: 74.5 mmHg — AB (ref 80.0–100.0)
Patient temperature: 97.8
TCO2: 20.3 mmol/L (ref 0–100)

## 2016-01-05 LAB — URINALYSIS, ROUTINE W REFLEX MICROSCOPIC
BILIRUBIN URINE: NEGATIVE
GLUCOSE, UA: NEGATIVE mg/dL
HGB URINE DIPSTICK: NEGATIVE
Ketones, ur: NEGATIVE mg/dL
Nitrite: POSITIVE — AB
Protein, ur: NEGATIVE mg/dL
SPECIFIC GRAVITY, URINE: 1.037 — AB (ref 1.005–1.030)
pH: 7 (ref 5.0–8.0)

## 2016-01-05 LAB — CBC WITH DIFFERENTIAL/PLATELET
Basophils Absolute: 0 10*3/uL (ref 0.0–0.1)
Basophils Relative: 0 %
EOS PCT: 0 %
Eosinophils Absolute: 0 10*3/uL (ref 0.0–0.7)
HCT: 35.6 % — ABNORMAL LOW (ref 36.0–46.0)
Hemoglobin: 12.3 g/dL (ref 12.0–15.0)
LYMPHS ABS: 0.3 10*3/uL — AB (ref 0.7–4.0)
LYMPHS PCT: 10 %
MCH: 28.5 pg (ref 26.0–34.0)
MCHC: 34.6 g/dL (ref 30.0–36.0)
MCV: 82.6 fL (ref 78.0–100.0)
MONOS PCT: 3 %
Monocytes Absolute: 0.1 10*3/uL (ref 0.1–1.0)
Neutro Abs: 2.2 10*3/uL (ref 1.7–7.7)
Neutrophils Relative %: 87 %
PLATELETS: 129 10*3/uL — AB (ref 150–400)
RBC: 4.31 MIL/uL (ref 3.87–5.11)
RDW: 15 % (ref 11.5–15.5)
WBC: 2.5 10*3/uL — ABNORMAL LOW (ref 4.0–10.5)

## 2016-01-05 LAB — URINE MICROSCOPIC-ADD ON

## 2016-01-05 LAB — COMPREHENSIVE METABOLIC PANEL
ALBUMIN: 3.8 g/dL (ref 3.5–5.0)
ALT: 20 U/L (ref 14–54)
AST: 19 U/L (ref 15–41)
Alkaline Phosphatase: 88 U/L (ref 38–126)
Anion gap: 8 (ref 5–15)
BUN: 24 mg/dL — AB (ref 6–20)
CHLORIDE: 103 mmol/L (ref 101–111)
CO2: 24 mmol/L (ref 22–32)
CREATININE: 0.44 mg/dL (ref 0.44–1.00)
Calcium: 8 mg/dL — ABNORMAL LOW (ref 8.9–10.3)
GFR calc Af Amer: >60 mL/min (ref >60)
GFR calc non Af Amer: >60 mL/min (ref >60)
Glucose, Bld: 176 mg/dL — ABNORMAL HIGH (ref 65–99)
POTASSIUM: 4.2 mmol/L (ref 3.5–5.1)
SODIUM: 135 mmol/L (ref 135–145)
Total Bilirubin: 0.8 mg/dL (ref 0.3–1.2)
Total Protein: 7.4 g/dL (ref 6.5–8.1)

## 2016-01-05 LAB — I-STAT CG4 LACTIC ACID, ED
Lactic Acid, Venous: 2.16 mmol/L (ref 0.5–2.0)
Lactic Acid, Venous: 1.85 mmol/L (ref 0.5–2.0)

## 2016-01-05 LAB — AMMONIA: AMMONIA: 58 umol/L — AB (ref 9–35)

## 2016-01-05 MED ORDER — DEXTROSE 5 % IV SOLN
1.0000 g | Freq: Once | INTRAVENOUS | Status: AC
  Administered 2016-01-05: 1 g via INTRAVENOUS

## 2016-01-05 MED ORDER — LORAZEPAM 2 MG/ML PO CONC
1.0000 mg | Freq: Four times a day (QID) | ORAL | Status: DC | PRN
  Administered 2016-01-06 – 2016-01-07 (×3): 1 mg via ORAL
  Filled 2016-01-05 (×3): qty 1

## 2016-01-05 MED ORDER — DEXTROSE 5 % IV SOLN
500.0000 mg | Freq: Once | INTRAVENOUS | Status: AC
  Administered 2016-01-05: 500 mg via INTRAVENOUS
  Filled 2016-01-05: qty 500

## 2016-01-05 MED ORDER — POLYETHYLENE GLYCOL 3350 17 G PO PACK: 17.0000 g | PACK | Freq: Every day | ORAL | Status: DC | PRN

## 2016-01-05 MED ORDER — SODIUM CHLORIDE 0.9 % IV SOLN
INTRAVENOUS | Status: AC
  Administered 2016-01-06: 07:00:00 via INTRAVENOUS

## 2016-01-05 MED ORDER — CEFTRIAXONE SODIUM 1 G IJ SOLR
1.0000 g | INTRAMUSCULAR | Status: DC
  Filled 2016-01-05: qty 10

## 2016-01-05 MED ORDER — SODIUM CHLORIDE 0.9 % IV BOLUS (SEPSIS)
1000.0000 mL | INTRAVENOUS | Status: AC
  Administered 2016-01-05 (×2): 1000 mL via INTRAVENOUS

## 2016-01-05 MED ORDER — LORAZEPAM 0.5 MG PO TABS: 0.5000 mg | ORAL_TABLET | ORAL | Status: AC | PRN

## 2016-01-05 MED ORDER — DEXAMETHASONE 2 MG PO TABS
4.0000 mg | ORAL_TABLET | Freq: Three times a day (TID) | ORAL | Status: DC
  Administered 2016-01-06 – 2016-01-09 (×10): 4 mg via ORAL
  Filled 2016-01-05: qty 2
  Filled 2016-01-05: qty 1
  Filled 2016-01-05: qty 2
  Filled 2016-01-05 (×2): qty 1
  Filled 2016-01-05: qty 2
  Filled 2016-01-05: qty 1
  Filled 2016-01-05 (×3): qty 2

## 2016-01-05 MED ORDER — ALBUTEROL SULFATE (2.5 MG/3ML) 0.083% IN NEBU: 2.5000 mg | INHALATION_SOLUTION | RESPIRATORY_TRACT | Status: AC | PRN

## 2016-01-05 MED ORDER — HYDROCODONE-ACETAMINOPHEN 5-325 MG PO TABS: 1.0000 | ORAL_TABLET | ORAL | Status: DC | PRN

## 2016-01-05 MED ORDER — SODIUM CHLORIDE 0.9% FLUSH
3.0000 mL | Freq: Two times a day (BID) | INTRAVENOUS | Status: DC
  Administered 2016-01-07 – 2016-01-10 (×6): 3 mL via INTRAVENOUS

## 2016-01-05 MED ORDER — ACETAMINOPHEN 650 MG RE SUPP: 650.0000 mg | Freq: Four times a day (QID) | RECTAL | Status: DC | PRN

## 2016-01-05 MED ORDER — BISACODYL 10 MG RE SUPP: 10.0000 mg | Freq: Every day | RECTAL | Status: DC | PRN

## 2016-01-05 MED ORDER — ONDANSETRON HCL 4 MG PO TABS: 4.0000 mg | ORAL_TABLET | Freq: Four times a day (QID) | ORAL | Status: DC | PRN

## 2016-01-05 MED ORDER — ACETAMINOPHEN 325 MG PO TABS: 650.0000 mg | ORAL_TABLET | Freq: Four times a day (QID) | ORAL | Status: DC | PRN

## 2016-01-05 MED ORDER — PALBOCICLIB 125 MG PO CAPS: 125.0000 mg | ORAL_CAPSULE | Freq: Every day | ORAL | Status: DC

## 2016-01-05 MED ORDER — MORPHINE SULFATE (CONCENTRATE) 10 MG/0.5ML PO SOLN
5.0000 mg | ORAL | Status: DC | PRN
Start: 2016-01-05 — End: 2016-01-09
  Administered 2016-01-06 – 2016-01-08 (×8): 5 mg via ORAL
  Filled 2016-01-05 (×9): qty 0.5

## 2016-01-05 MED ORDER — AZITHROMYCIN 500 MG IV SOLR: 500.0000 mg | INTRAVENOUS | Status: DC

## 2016-01-05 MED ORDER — ENOXAPARIN SODIUM 40 MG/0.4ML ~~LOC~~ SOLN
40.0000 mg | SUBCUTANEOUS | Status: DC
  Administered 2016-01-06 – 2016-01-10 (×5): 40 mg via SUBCUTANEOUS
  Filled 2016-01-05 (×5): qty 0.4

## 2016-01-05 MED ORDER — ONDANSETRON HCL 4 MG/2ML IJ SOLN: 4.0000 mg | Freq: Four times a day (QID) | INTRAMUSCULAR | Status: DC | PRN

## 2016-01-05 MED ORDER — IOHEXOL 350 MG/ML SOLN
100.0000 mL | Freq: Once | INTRAVENOUS | Status: AC | PRN
  Administered 2016-01-05: 100 mL via INTRAVENOUS

## 2016-01-05 NOTE — ED Notes (Addendum)
Entered in error

## 2016-01-05 NOTE — ED Notes (Signed)
Verbal report given to writer from outgoing NT, Tech sts in report that pt is unable to urinate and has been unable on her shift.  She also sts that she was waiting for an order for an in and out cath.

## 2016-01-05 NOTE — H&P (Signed)
[facility] History and Physical  Michele Hale [id number] DOB: 02-Oct-1946 DOA: 01/13/2016  Referring physician: ED physician PCP: Angelica Chessman, MD  Specialists:  Dr. Isidore Moos (radiation-oncology), Dr. Lindi Adie (oncology)   Chief Complaint:  Respiratory distress, AMS   HPI: Michele Hale is an unfortunate 70 y.o. female with PMH of left breast cancer, widely metastatic, now presenting to the emergency department with respiratory distress and depressed level of consciousness. Patient was diagnosed with breast cancer in 2012 and underwent neoadjuvant antiestrogen therapy followed by lumpectomy and radiation that year. She was diagnosed with metastatic disease in May 2016 and has known metastases to the liver, lung, and brain. She has been receiving palliative whole-brain radiation and home hospice services. Patient's condition has been rapidly declining. Her breathing has become more labored and she is less and less responsive. Today, with the patient very poorly responsive, and with labored, snoring respirations, her family sought the advice of the hospice nurses and oncology team and now bring her in to the ED for evaluation. Family has confirmed that the patient is full code, explaining that they believe this is in-line with her wishes.  In ED, patient was found to be afebrile, requiring supplemental oxygen to maintain saturation in the 90s, and with vital signs otherwise stable. There are no acute findings on chest CT, with no suggestion of pneumonia, and no significant progression of metastatic lesions since the recent prior CT chest. Head CT is also negative for acute findings, but re-demonstrates the diffuse brain metastases. Blood work is notable for a white blood cell count of 2500, platelet count of 129,000, and serum lactate of 2.16. Blood cultures were obtained, 30 cc/kg NS bolus was given, and empiric azithromycin and Rocephin were administered for suspected sepsis. Patient  remained hemodynamically stable in the emergency department and will be admitted to the hospital for ongoing evaluation and management of acute hypoxic respiratory failure and rapid overall decline in this elderly female with widely metastatic breast cancer.  Where does patient live?   At home     Can patient participate in ADLs?  Barely      Review of Systems:  Unable to obtain ROS secondary to patient's clinical condition with acute encephalopathy and non-verbal state.     Allergy: No Known Allergies  Past Medical History  Diagnosis Date  . Abscess   . IBS (irritable bowel syndrome)   . Breast mass   . Osteoporosis   . Cancer ([place])     breast - lt  . Radiation 01/13/2011-02/17/2011    left breast 50 Gy  . Brain metastases [facility])     Past Surgical History  Procedure Laterality Date  . Cesarean section    . Breast lumpectomy      lt breast    Social History:  reports that she has never smoked. She does not have any smokeless tobacco history on file. She reports that she does not drink alcohol or use illicit drugs.  Family History:  Family History  Problem Relation Age of Onset  . Cancer Father     liver  . Cancer Mother     liver     Prior to Admission medications   Medication Sig Start Date End Date Taking? Authorizing Provider  acetaminophen (TYLENOL) 500 MG tablet Take 1,000 mg by mouth every 6 (six) hours as needed. For arthritis pain   Yes Historical Provider, MD  dexamethasone (DECADRON) 4 MG tablet Take 4 mg by mouth 3 (three) times daily. Called in to [facility] on 12/30/15.  Disp. 60 tablets. 0 refills.   Yes Gery Pray, MD  lidocaine-prilocaine (EMLA) cream Apply to port-a-cath one and a half hours before use.  Cover cream with plastic wrap to seal. 06/11/15  Yes Nicholas Lose, MD  morphine (ROXANOL) 20 MG/ML concentrated solution Take 5 mg by mouth every 2 (two) hours as needed for severe pain.   Yes Historical Provider, MD  palbociclib  (IBRANCE) 125 MG capsule Take 1 capsule (125 mg total) by mouth daily with breakfast. Take whole with food. 12/17/15  Yes Nicholas Lose, MD  promethazine (PHENERGAN) 25 MG tablet Take 1 tablet (25 mg total) by mouth every 6 (six) hours as needed for nausea or vomiting. 11/19/15  Yes Nicholas Lose, MD  albuterol (PROVENTIL) (2.5 MG/3ML) 0.083% nebulizer solution Take 3 mLs (2.5 mg total) by nebulization every 4 (four) hours as needed for wheezing or shortness of breath. Patient not taking: Reported on 12/30/2015 01/04/2016   Nicholas Lose, MD  cholecalciferol (VITAMIN D) 1000 UNITS tablet Take 1 tablet (1,000 Units total) by mouth daily. Patient not taking: Reported on 12/30/2015 01/23/13   Consuela Mimes, MD  HYDROcodone-acetaminophen (NORCO/VICODIN) 5-325 MG per tablet Take 1-2 tablets by mouth every 4 (four) hours as needed. Patient not taking: Reported on 01/03/2016 02/21/15   Wandra Arthurs, MD  ibuprofen (ADVIL,MOTRIN) 800 MG tablet Take 1 tablet (800 mg total) by mouth every 8 (eight) hours as needed. Patient not taking: Reported on 12/26/2015 03/26/15   Nicholas Lose, MD  loratadine (CLARITIN) 10 MG tablet Take 1 tablet (10 mg total) by mouth daily. Patient not taking: Reported on 01/09/2016 12/25/13   Lorayne Marek, MD  LORazepam (ATIVAN) 0.5 MG tablet Take 1 tablet (0.5 mg total) by mouth every 4 (four) hours as needed for anxiety. Patient not taking: Reported on 12/25/2015 01/23/2016   Nicholas Lose, MD  magnesium oxide (MAG-OX) 400 (241.3 MG) MG tablet Take 1 tablet (400 mg total) by mouth daily. Patient not taking: Reported on 01/03/2016 10/15/15   Nicholas Lose, MD    Physical Exam: Filed Vitals:   01/07/2016 2025 01/14/2016 2026 12/31/2015 2027 12/31/2015 2028  BP:    129/87  Pulse: 81 80 77 72  Temp:      TempSrc:      Resp:    18  Height:      Weight:      SpO2: 97% 99% 98% 98%   General: In acute respiratory distress with gasping and sonorous respirations HEENT:       Eyes: PERRL, EOMI, no scleral icterus or  conjunctival pallor.       ENT: No discharge from the ears or nose, oral mucosa moist and pink.        Neck: No JVD, no bruit, no appreciable mass Heme: No cervical adenopathy, no pallor Cardiac: S1/S2, RRR, soft systolic murmur at LSB, No gallops or rubs. Pulm: Coarse ronchi throughout, symmetric chest wall expansion Abd: Soft, nondistended, nontender, no rebound pain or gaurding, BS present. Ext: No LE edema bilaterally. 2+DP/PT pulse bilaterally. Musculoskeletal: No gross deformity, no red, hot, swollen joints  Skin: No rashes or wounds on exposed surfaces  Neuro: Obtunded, responsive to pain only, no gross facial asymmetry, moving all extremities spontaneously, pupils equal and sluggishly reactive, patellar DTR 2+ b/l, Babinski sign down-going b/l.  Psych: Patient is not overtly psychotic, though unable to assess given clinical scenario.  Labs on Admission:  Basic Metabolic Panel:  Recent Labs Lab 12/31/2015 1810  NA 135  K 4.2  CL 103  CO2 24  GLUCOSE 176*  BUN 24*  CREATININE 0.44  CALCIUM 8.0*   Liver Function Tests:  Recent Labs Lab 01/17/2016 1810  AST 19  ALT 20  ALKPHOS 88  BILITOT 0.8  PROT 7.4  ALBUMIN 3.8   No results for input(s): LIPASE, AMYLASE in the last 168 hours.  Recent Labs Lab 01/20/2016 2159  AMMONIA 58*   CBC:  Recent Labs Lab 12/30/2015 1810  WBC 2.5*  NEUTROABS 2.2  HGB 12.3  HCT 35.6*  MCV 82.6  PLT 129*   Cardiac Enzymes: No results for input(s): CKTOTAL, CKMB, CKMBINDEX, TROPONINI in the last 168 hours.  BNP (last 3 results)  Recent Labs  02/21/15 1530  BNP 40.9    ProBNP (last 3 results) No results for input(s): PROBNP in the last 8760 hours.  CBG: No results for input(s): GLUCAP in the last 168 hours.  Radiological Exams on Admission: Ct Head Wo Contrast  01/23/2016  CLINICAL DATA:  Acute onset of altered mental status and apnea. Generalized weakness and confusion. Initial encounter. EXAM: CT HEAD WITHOUT CONTRAST  TECHNIQUE: Contiguous axial images were obtained from the base of the skull through the vertex without intravenous contrast. COMPARISON:  MRI of the brain performed 12/24/2015, and CT of the head performed 02/21/2015 FINDINGS: Scattered areas of hypoattenuation reflect the patient's known diffuse brain metastases. These are better characterized on recent prior MRI. There is no evidence of hemorrhagic transformation. No acute infarct is seen. Scattered periventricular white matter change likely reflects small vessel ischemic microangiopathy. Mild cortical volume loss is noted. Mild cerebellar atrophy is seen. No midline shift is seen. There is no evidence of fracture; visualized osseous structures are unremarkable in appearance. The orbits are within normal limits. The paranasal sinuses and mastoid air cells are well-aerated. No significant soft tissue abnormalities are seen. IMPRESSION: 1. Scattered areas of hypoattenuation reflect the patient's known diffuse brain metastases. These are better characterized on recent prior MRI. 2. Scattered small vessel ischemic microangiopathy and mild cortical volume loss. Electronically Signed   By: Garald Balding M.D.   On: 01/09/2016 21:52   Ct Angio Chest Pe W/cm &/or Wo Cm  01/20/2016  CLINICAL DATA:  Difficulty breathing the past 3 days. Coughing with sputum. Admitted yesterday to hospice. Increased weakness with shaking. EXAM: CT ANGIOGRAPHY CHEST WITH CONTRAST TECHNIQUE: Multidetector CT imaging of the chest was performed using the standard protocol during bolus administration of intravenous contrast. Multiplanar CT image reconstructions and MIPs were obtained to evaluate the vascular anatomy. CONTRAST:  172mL OMNIPAQUE IOHEXOL 350 MG/ML SOLN COMPARISON:  Chest x-ray from earlier same day and chest CT dated 12/15/2015. FINDINGS: Anterior mediastinal mass measuring 5.6 x 3.4 cm, stable compared to the most recent chest CT. By report, this measured 4.6 x 2.7 cm on  09/22/2015. Lower right paratracheal lymph node is stable measuring 1.9 cm short axis dimension. Additional nodular mass within the aorta pulmonary window region is stable at 1.4 cm short axis dimension. No new masses or enlarged lymph nodes within the mediastinum or perihilar regions. The majority of the peripheral segmental and subsegmental pulmonary arteries cannot be definitively characterized due to patient breathing motion artifact. There is no central obstructing pulmonary embolism identified within the main, lobar or central segmental pulmonary arteries. Thoracic aorta is normal in caliber and configuration. No aortic aneurysm or dissection. Heart size is upper normal. No pericardial effusion. Lungs are clear. No evidence of pneumonia. No pleural effusion.  No pneumothorax. Trachea and central bronchi are unremarkable. Limited images of the upper abdomen are unremarkable. Left breast mass is stable in appearance, measuring approximately 2.8 cm greatest dimension, compatible with patient's history of breast cancer and/or postsurgical change. No acute osseous abnormality appreciated. Degenerative changes again noted within the thoracic spine. Irregular sclerosis and lucency within the sternum is stable and may be related to post treatment change. Review of the MIP images confirms the above findings. IMPRESSION: 1. Stable exam. The mediastinal masses and lymphadenopathy are stable compared to the most recent chest CT of 12/15/2015, although progression was noted in the last CT report when compared to an earlier CT of 09/22/2015. 2. No acute findings. No pulmonary embolism seen, with study limitations detailed above. Lungs are clear. Electronically Signed   By: Franki Cabot M.D.   On: 01/20/2016 22:00   Dg Chest Port 1 View  12/25/2015  CLINICAL DATA:  Increased weakness, worsened breathing, increased sputum. History of left breast cancer with brain metastases. EXAM: PORTABLE CHEST 1 VIEW COMPARISON:  Chest  x-ray dated 02/21/2015. Comparison also made to chest CT of 12/15/2015. FINDINGS: Cardiomediastinal silhouette appears stable in size and configuration. Some fullness at the left hilum is likely related to the metastatic lymphadenopathy identified within the mediastinum on earlier chest CT. Right chest wall Port-A-Cath now in place with tip well-positioned at the level of the lower SVC. Lungs are clear. No pleural effusion. No pneumothorax. Osseous structures about the chest are unremarkable. IMPRESSION: Fullness in the left hilum likely related to the metastatic lymphadenopathy identified within the mediastinum on earlier chest CT. Lungs are clear and there is no evidence of acute cardiopulmonary abnormality. No evidence of pneumonia. Electronically Signed   By: Franki Cabot M.D.   On: 01/20/2016 18:45    EKG: Independently reviewed.  Abnormal findings:   Sinus rhythm, early R-wave transition  Assessment/Plan  1. Acute hypoxic respiratory failure  - Suspect this is secondary to progression of metastatic disease involving lungs and brainstem  - No clear evidence of infectious process at this time; will continue abx while awaiting culture data - Continuous pulse oximetry with titration of FiO2 to maintain sat >92%  - CTA negative for PE; no infiltrate seen   2. Breast cancer, widely metastatic  - Left breast cancer diagnosed 2012 and treated with lumpectomy, chemo, radiation  - Metastases diagnosed in May 2016 and known to involve both lungs, liver, and brain  - Undergoing palliative whole-brain radiation - Receiving home-hospice services, but family desires full-treatment  - Palliative care consultation requested to help clarify goals of care   3. Pancytopenia  - Likely secondary to antineoplastic agents  - No sign of active blood loss or infectious process  - Trend    4. Hyperglycemia  - Likely secondary to Decadron use  - Check CBGs and institute a low-intensity SSI correctional prn    5. Acute encephalopathy  - Pt is obtunded, likely secondary to diffuse brain metastases  - Continue to support   DVT ppx:  SQ Lovenox    Code Status: Full code Family Communication:  Yes, patient's son at bed side Disposition Plan: Admit to inpatient   Date of Service 01/06/2016    Vianne Bulls, MD [facility] Pager [phone]  If 7PM-7AM, please contact night-coverage [email or web address] Password [facility] 12/24/2015, 11:28 PM

## 2016-01-05 NOTE — Telephone Encounter (Signed)
Called Jan, RN with Dr. Geralyn Flash office.  Notified her of patient's status and that the family would like her admitted.

## 2016-01-05 NOTE — ED Provider Notes (Signed)
CSN: [id number]     Arrival date & time 01/06/2016  1707 History   First MD Initiated Contact with Patient 01/04/2016 1732     Chief Complaint  Patient presents with  . Shortness of Breath  . Weakness  . Cough     (Consider location/radiation/quality/duration/timing/severity/associated sxs/prior Treatment) HPI This 70 year old female female brought in by her son and daughter-in-law for difficulty breathing. She has metastatic breast cancer with metastases to the liver and brain and hip. She is on radiology oncology with worsening spread of her cancer. She is admitted to hospice yesterday. Review of the EMR shows that her hospice and palliative care nurse offered to bring home oxygen for worsening shortness of breath but family opted to bring her to the emergency department for admission. She is attended by the daughter-in-law who is a Forensic scientist, and the son who speaks no [place]. The daughter-in-law states that they want "every intervention done to save her." Patient has had 2 weeks of gurgling breath sounds.. The past 3 days she has had worsening shortness of breath, coughing up no milky white sputum with blood. She has been increasingly somnolent and altered. He shouldn't is no longer responding to voice. Past Medical History  Diagnosis Date  . Abscess   . IBS (irritable bowel syndrome)   . Breast mass   . Osteoporosis   . Cancer ([place])     breast - lt  . Radiation 01/13/2011-02/17/2011    left breast 50 Gy  . Brain metastases [facility])    Past Surgical History  Procedure Laterality Date  . Cesarean section    . Breast lumpectomy      lt breast   Family History  Problem Relation Age of Onset  . Cancer Father     liver  . Cancer Mother     liver   Social History  Substance Use Topics  . Smoking status: Never Smoker   . Smokeless tobacco: None  . Alcohol Use: No   OB History    No data available     Review of Systems  Ten systems reviewed and are negative for acute  change, except as noted in the HPI.    Allergies  Review of patient's allergies indicates no known allergies.  Home Medications   Prior to Admission medications   Medication Sig Start Date End Date Taking? Authorizing Provider  acetaminophen (TYLENOL) 500 MG tablet Take 1,000 mg by mouth every 6 (six) hours as needed. For arthritis pain   Yes Historical Provider, MD  dexamethasone (DECADRON) 4 MG tablet Take 4 mg by mouth 3 (three) times daily. Called in to [facility] on 12/30/15.  Disp. 60 tablets. 0 refills.   Yes Gery Pray, MD  lidocaine-prilocaine (EMLA) cream Apply to port-a-cath one and a half hours before use.  Cover cream with plastic wrap to seal. 06/11/15  Yes Nicholas Lose, MD  morphine (ROXANOL) 20 MG/ML concentrated solution Take 5 mg by mouth every 2 (two) hours as needed for severe pain.   Yes Historical Provider, MD  palbociclib (IBRANCE) 125 MG capsule Take 1 capsule (125 mg total) by mouth daily with breakfast. Take whole with food. 12/17/15  Yes Nicholas Lose, MD  promethazine (PHENERGAN) 25 MG tablet Take 1 tablet (25 mg total) by mouth every 6 (six) hours as needed for nausea or vomiting. 11/19/15  Yes Nicholas Lose, MD  albuterol (PROVENTIL) (2.5 MG/3ML) 0.083% nebulizer solution Take 3 mLs (2.5 mg total) by nebulization every 4 (four) hours  as needed for wheezing or shortness of breath. Patient not taking: Reported on 12/30/2015 01/15/2016   Nicholas Lose, MD  cholecalciferol (VITAMIN D) 1000 UNITS tablet Take 1 tablet (1,000 Units total) by mouth daily. Patient not taking: Reported on 12/30/2015 01/23/13   Consuela Mimes, MD  HYDROcodone-acetaminophen (NORCO/VICODIN) 5-325 MG per tablet Take 1-2 tablets by mouth every 4 (four) hours as needed. Patient not taking: Reported on 01/23/2016 02/21/15   Wandra Arthurs, MD  ibuprofen (ADVIL,MOTRIN) 800 MG tablet Take 1 tablet (800 mg total) by mouth every 8 (eight) hours as needed. Patient not taking: Reported on 12/25/2015  03/26/15   Nicholas Lose, MD  loratadine (CLARITIN) 10 MG tablet Take 1 tablet (10 mg total) by mouth daily. Patient not taking: Reported on 12/24/2015 12/25/13   Lorayne Marek, MD  LORazepam (ATIVAN) 0.5 MG tablet Take 1 tablet (0.5 mg total) by mouth every 4 (four) hours as needed for anxiety. Patient not taking: Reported on 01/06/2016 01/18/2016   Nicholas Lose, MD  magnesium oxide (MAG-OX) 400 (241.3 MG) MG tablet Take 1 tablet (400 mg total) by mouth daily. Patient not taking: Reported on 01/08/2016 10/15/15   Nicholas Lose, MD   BP 149/82 mmHg  Pulse 74  Temp(Src) 97.8 F (36.6 C) (Oral)  Resp 23  Ht 5\' 4"  (1.626 m)  Wt 63.504 kg  BMI 24.02 kg/m2  SpO2 100% Physical Exam  Constitutional: She appears lethargic.  The patient appears very ill, chronically and acutely. Protein ,malnourished  HENT:  Head: Normocephalic.  Neck: Normal range of motion. No JVD present.  Cardiovascular:  Tachycardic  Pulmonary/Chest:  Loud, snoring respirations while sitting upright. Thick, rhonchorous breath sounds.  Port present in R upper chest wall  Abdominal: Soft. She exhibits no distension. There is no tenderness.  Neurological: She appears lethargic. She is disoriented. GCS eye subscore is 1. GCS verbal subscore is 2. GCS motor subscore is 1.  Skin: Skin is warm.  Nursing note and vitals reviewed.   ED Course  Procedures (including critical care time) Labs Review Labs Reviewed  CULTURE, BLOOD (ROUTINE X 2)  CULTURE, BLOOD (ROUTINE X 2)  URINE CULTURE  COMPREHENSIVE METABOLIC PANEL  CBC WITH DIFFERENTIAL/PLATELET  URINALYSIS, ROUTINE W REFLEX MICROSCOPIC (NOT AT [facility])  I-STAT CG4 LACTIC ACID, ED    Imaging Review No results found. I have personally reviewed and evaluated these images and lab results as part of my medical decision-making.   EKG Interpretation None      MDM   Final diagnoses:  Respiratory failure (HCC)    6:52 PM BP 149/82 mmHg  Pulse 74  Temp(Src) 97.8 F  (36.6 C) (Oral)  Resp 20  Ht 5\' 4"  (1.626 m)  Wt 63.504 kg  BMI 24.02 kg/m2  SpO2 100% Patient appears gravely ill. Dr. Lacinda Axon and I discussed goals of care with the daughter in law. Currently they wish full code. We will discuss this with her sons. Her cxr is negative   8:27 PM BP 149/82 mmHg  Pulse 74  Temp(Src) 97.8 F (36.6 C) (Oral)  Resp 20  Ht 5\' 4"  (1.626 m)  Wt 63.504 kg  BMI 24.02 kg/m2  SpO2 100%  I had a long discussion about goals of care with the patient's son.  Her son is still adamant that any and all interventions be done. Will proceed with CTA chest Respirations are still snoring, but she is maintaining her airway and she has no improvement in her  mental status.   Patient  CT returned. CT head shows multiple brain metastases. CT of the chest shows no pulmonary embolus or signs of pneumonia. I suspect her altered mental status and breathing problems are secondary to her brain metastases and do not expect that she will recover well. Ammonia is still pending. Urine is still pending. I spoken with Dr. Myna Hidalgo will admit the patient.   Margarita Mail, PA-C 01/06/16 0113  Nat Christen, MD 01/06/16 [phone]

## 2016-01-05 NOTE — Progress Notes (Addendum)
Michele Hale has completed 3 fractions to her brain.  Her daughter in law reports that Michele Hale has had more difficulty breathing for the last 2-3 days.  She has been coughing milky white sputum.  Yesterday she coughed up yellow sputum mixed with blood.  She also had an apnea for 4 seconds.  She was admitted yesterday to hospice.  She was given morphine and has taken it 3 times since last night. She is taking decadron 4 mg TID.  She is also more weak and has not been walking at home.  Her daughter in law also reports that she is more confused.  Her family is also reporting that she is shaking.  BP 131/89 mmHg  Pulse 83  Temp(Src) 98.5 F (36.9 C) (Oral)  SpO2 98%

## 2016-01-05 NOTE — Progress Notes (Signed)
   Weekly Management Note:  OUTpatient    ICD-9-CM ICD-10-CM   1. Brain metastasis (HCC) 198.3 C79.31     Current Dose:  9 Gy  Projected Dose: 30 Gy   Narrative:  The patient presents for routine under treatment assessment.  CBCT/MVCT images/Port film x-rays were reviewed.  The chart was checked. Joelene has completed 3 fractions to her brain.  Her daughter in law reports that Princessa has had more difficulty breathing for the last 2-3 days.  She has been coughing milky white sputum.  Yesterday she coughed up yellow sputum mixed with blood.  She also had an apnea for 4 seconds.  She was admitted yesterday to hospice.  She was given morphine and has taken it 3 times since last night. She is taking decadron 4 mg TID.  She is also more weak and has not been walking at home.  Her daughter in law also reports that she is more confused.  Her family is also reporting that she is shaking. They note a clear clinical decline in their mother. They don't yet have O2 at home via hospice.  They state she is full code.  BP 131/89 mmHg  Pulse 83  Temp(Src) 98.5 F (36.9 C) (Oral)  SpO2 98%  Physical Findings:  oral temperature is 98.5 F (36.9 C). Her blood pressure is 131/89 and her pulse is 83. Her oxygen saturation is 98%.   Wt Readings from Last 3 Encounters:  12/25/2015 140 lb (63.504 kg)  12/24/15 144 lb (65.318 kg)  12/17/15 143 lb 8 oz (65.091 kg)   Frail, labored breathing, somnolent.  Impression:  The patient appears to be having respiratory distress - could be due to cancer, infection, or other cause  Plan: Discussed trying to arrange expedited delivery of O2 tonight to home vs going to ED for admission. Sending patient to ED per family's desires.   Prior to this decision to go to the ED, we discussed the patient's poor prognosis. I estimated her life expectancy to be weeks at best, possibly days.  The family may benefit from a discussion upon admission to reassess her code status as I fear CPR or  other drastic measures to revive her would be futile. There was not time to do this in detail today as she was headed to the ED. ________________________________   Eppie Gibson, M.D.

## 2016-01-05 NOTE — Progress Notes (Signed)
Per chart review, patient is currently receiving home hospice services with [facility] and [facility].  Pcp list Dr. Doreene Burke.  [facility] discussed patient with EDPA ad EDP.  Patient is a full code.

## 2016-01-05 NOTE — Telephone Encounter (Signed)
Prescription for Albuterol and Ativan sent to [facility] on [address] per [facility] with [facility].

## 2016-01-05 NOTE — Progress Notes (Addendum)
Pharmacy Antibiotic Follow-up Note  Michele Hale is a 70 y.o. year-old female admitted on 01/12/2016.  The patient is currently on day 1 of Rocephin & Azithromycin for PNA.Marland Kitchen  Assessment/Plan: Hx of breast Ca. Progression of mets in mediastinum 3.6 X 6.1 cm (was 2.7 x 4.6 cm), new large lytic lesion Rt ileum, 2.9 cm mass left breast same as before, manubrium and sternum stable.  Treatment Plan: [name] with Faslodex, XGeva prior to admission This patient's current antibiotics will be continued without adjustments.  Temp (24hrs), Avg:98.2 F (36.8 C), Min:97.8 F (36.6 C), Max:98.5 F (36.9 C)  No results for input(s): WBC in the last 168 hours.  Invalid input(s):  CREATININE No results for input(s): CREATININE in the last 168 hours. Estimated Creatinine Clearance: 54.9 mL/min (by C-G formula based on Cr of 0.7).    No Known Allergies  Antimicrobials this admission: 3/13 Rocephin >>  3/13 Azithromycin  >>   Microbiology results: 3/13 BCx: sent  Thank you for allowing pharmacy to be a part of this patient's care.  Minda Ditto PharmD 01/14/2016 6:02 PM

## 2016-01-05 NOTE — ED Notes (Signed)
Bed: WA08 Expected date:  Expected time:  Means of arrival:  Comments: CANCER CTR SHORTNESS OF BREATH.HOSPICE AT HOME. SUDANESE

## 2016-01-05 NOTE — Progress Notes (Signed)
Patient's family decided that they wanted patient admitted.  Called ER and spoke to charge nurse, Charlann Boxer, Therapist, sports.  Brought patient to the ER, Room 8.  Gave report to [place], Therapist, sports.

## 2016-01-05 NOTE — Telephone Encounter (Signed)
Telephone advice record received from [facility]. Called and spoke with [facility] RN called and received order from Dr. Alen Blew to admit patient to hospice. Changed attending physician to Dr. Lindi Adie and made him aware that patient was under hospice care.

## 2016-01-05 NOTE — Telephone Encounter (Addendum)
Called Jan, Nursing Director with [facility] and notified her that patient's family would like her to be admitted.  Jan verbalized agreement.

## 2016-01-05 NOTE — Telephone Encounter (Addendum)
Called Jan with [facility] and [place].  Advised her that Poetry is having trouble breathing and needs oxygen at home or needs to be admitted.  Jan said oxygen has been ordered.

## 2016-01-05 NOTE — ED Notes (Signed)
Patient presents today with family with complaints of increased weakness, worsening breathing, and increased sputum that is milky white. She does not speak english and is accompanied by her daughter-in-law and son. She has new oxygen requirements and is unable to follow commands consistently d/t weakness.

## 2016-01-05 NOTE — ED Notes (Signed)
Attempted to pass foley catheter per MD request. Patient has had surgical procedure that does not allow placement. MD notified.

## 2016-01-05 NOTE — Telephone Encounter (Signed)
"  Patient new admit to [facility] last night.  Breathing restricted.  Cough with productive yellow to milky white sputum.  O2 saturation = 97%, respiratory rate = 30 to 40.  Could we have an order for nebulizer and albuteral for nebulizer,  Oxygen concentrator and lorazepam sent to [facility].  Dr. Lyman Speller ordered Roxanol so this is available at this time.  Paul's return number [phone]"

## 2016-01-06 ENCOUNTER — Ambulatory Visit
Admission: RE | Admit: 2016-01-06 | Discharge: 2016-01-06 | Disposition: A | Discharge status: Home / Self Care | Payer: Medicaid Other | Source: Ambulatory Visit | Attending: Radiation Oncology | Admitting: Radiation Oncology

## 2016-01-06 ENCOUNTER — Encounter: Payer: Self-pay | Admitting: Radiation Oncology

## 2016-01-06 ENCOUNTER — Telehealth: Payer: Self-pay | Admitting: Oncology

## 2016-01-06 DIAGNOSIS — C7931 Secondary malignant neoplasm of brain: Secondary | ICD-10-CM

## 2016-01-06 DIAGNOSIS — C7951 Secondary malignant neoplasm of bone: Secondary | ICD-10-CM

## 2016-01-06 DIAGNOSIS — J9690 Respiratory failure, unspecified, unspecified whether with hypoxia or hypercapnia: Secondary | ICD-10-CM | POA: Insufficient documentation

## 2016-01-06 DIAGNOSIS — D61818 Other pancytopenia: Secondary | ICD-10-CM

## 2016-01-06 DIAGNOSIS — G9340 Encephalopathy, unspecified: Secondary | ICD-10-CM | POA: Insufficient documentation

## 2016-01-06 DIAGNOSIS — C50919 Malignant neoplasm of unspecified site of unspecified female breast: Secondary | ICD-10-CM

## 2016-01-06 DIAGNOSIS — R0600 Dyspnea, unspecified: Secondary | ICD-10-CM

## 2016-01-06 DIAGNOSIS — C7800 Secondary malignant neoplasm of unspecified lung: Secondary | ICD-10-CM

## 2016-01-06 LAB — LACTIC ACID, PLASMA
Lactic Acid, Venous: 1.3 mmol/L (ref 0.5–2.0)
Lactic Acid, Venous: 1.2 mmol/L (ref 0.5–2.0)

## 2016-01-06 LAB — COMPREHENSIVE METABOLIC PANEL
ALBUMIN: 3.6 g/dL (ref 3.5–5.0)
ALT: 19 U/L (ref 14–54)
AST: 17 U/L (ref 15–41)
Alkaline Phosphatase: 80 U/L (ref 38–126)
Anion gap: 9 (ref 5–15)
BUN: 18 mg/dL (ref 6–20)
CHLORIDE: 104 mmol/L (ref 101–111)
CO2: 25 mmol/L (ref 22–32)
Calcium: 7.7 mg/dL — ABNORMAL LOW (ref 8.9–10.3)
Creatinine, Ser: 0.37 mg/dL — ABNORMAL LOW (ref 0.44–1.00)
GFR calc Af Amer: >60 mL/min (ref >60)
GLUCOSE: 135 mg/dL — AB (ref 65–99)
POTASSIUM: 3.9 mmol/L (ref 3.5–5.1)
SODIUM: 138 mmol/L (ref 135–145)
Total Bilirubin: 0.8 mg/dL (ref 0.3–1.2)
Total Protein: 6.8 g/dL (ref 6.5–8.1)

## 2016-01-06 LAB — APTT: APTT: 25 s (ref 24–37)

## 2016-01-06 LAB — PROCALCITONIN

## 2016-01-06 LAB — CBC WITH DIFFERENTIAL/PLATELET
Basophils Absolute: 0 10*3/uL (ref 0.0–0.1)
Basophils Relative: 0 %
EOS PCT: 0 %
Eosinophils Absolute: 0 10*3/uL (ref 0.0–0.7)
HCT: 35.8 % — ABNORMAL LOW (ref 36.0–46.0)
Hemoglobin: 11.8 g/dL — ABNORMAL LOW (ref 12.0–15.0)
LYMPHS ABS: 0.5 10*3/uL — AB (ref 0.7–4.0)
LYMPHS PCT: 29 %
MCH: 28.6 pg (ref 26.0–34.0)
MCHC: 33 g/dL (ref 30.0–36.0)
MCV: 86.7 fL (ref 78.0–100.0)
Monocytes Absolute: 0.1 10*3/uL (ref 0.1–1.0)
Monocytes Relative: 3 %
Neutro Abs: 1.2 10*3/uL — ABNORMAL LOW (ref 1.7–7.7)
Neutrophils Relative %: 68 %
PLATELETS: 112 10*3/uL — AB (ref 150–400)
RBC: 4.13 MIL/uL (ref 3.87–5.11)
RDW: 15.2 % (ref 11.5–15.5)
WBC: 1.7 10*3/uL — AB (ref 4.0–10.5)

## 2016-01-06 LAB — GLUCOSE, CAPILLARY: GLUCOSE-CAPILLARY: 91 mg/dL (ref 65–99)

## 2016-01-06 LAB — MRSA PCR SCREENING: MRSA by PCR: NEGATIVE

## 2016-01-06 LAB — PROTIME-INR
INR: 1.26 (ref 0.00–1.49)
PROTHROMBIN TIME: 15.4 s — AB (ref 11.6–15.2)

## 2016-01-06 LAB — TSH: TSH: 0.181 u[IU]/mL — ABNORMAL LOW (ref 0.350–4.500)

## 2016-01-06 MED ORDER — VITAMINS A & D EX OINT
TOPICAL_OINTMENT | CUTANEOUS | Status: AC
  Administered 2016-01-06: 03:00:00
  Filled 2016-01-06: qty 5

## 2016-01-06 MED ORDER — SODIUM CHLORIDE 0.9 % IV BOLUS (SEPSIS)
250.0000 mL | Freq: Once | INTRAVENOUS | Status: AC
  Administered 2016-01-06: 250 mL via INTRAVENOUS

## 2016-01-06 MED ORDER — SODIUM CHLORIDE 0.9 % IV BOLUS (SEPSIS): 500.0000 mL | Freq: Once | INTRAVENOUS | Status: DC

## 2016-01-06 MED ORDER — DOXYCYCLINE HYCLATE 100 MG PO TABS
100.0000 mg | ORAL_TABLET | Freq: Two times a day (BID) | ORAL | Status: DC
Start: 2016-01-06 — End: 2016-01-07
  Administered 2016-01-06 – 2016-01-07 (×3): 100 mg via ORAL
  Filled 2016-01-06 (×3): qty 1

## 2016-01-06 NOTE — Progress Notes (Signed)
[facility] Progress Note   Michele Hale  OEV:[id number]  DOB: 15-Nov-1945  DOA: 01/15/2016 PCP: Angelica Chessman, MD  Brief narrative: [name] is a 70 y.o. female with PMH of left breast cancer, widely metastatic currently on palliative chemotherapy become progressively weaker. He is on hospice at home. She was brought into the hospital for lethargy and labored breathing. Family insisted on maintaining her full code.   Subjective: He is awake and alert this morning. Complains of pain under her left eye. Noted to have coughed up yellow mucus that is present on the blanket. No complaints of shortness of breath. + fatigue and poor appetite.  Assessment/Plan: Principal Problem:   Acute respiratory failure with hypoxia  -CT angiogram shows stable mediastinal masses and lymphadenopathy  - not febrile but coughing up yellow mucus-DC Rocephin and Zithromax and start doxycycline-has pain in the left mandibular area which could be sinusitis and can be treated with doxycycline -? Due to Ativan, morphine, hydrocodone which she is on when necessary at home -Hopefully she'll be able to go home tomorrow if she continues to be stable  Active Problems: Lethargy and confusion- dehydration -Has known brain metastasis being managed by radiation- continue Decadron  - also on Ativan, hydrocodone and morphine at home which could've caused her lethargy and confusion -Appear to have been dehydrated on admission with elevated lactic acid and symptoms may have corrected with IV fluids -Follow by mouth intake  pancytopenia  -Noted to have mildly low WBC and platelets on admission- follow    Cancer of left breast metastatic to brain -Management per oncology -Palliative care team has evaluated here in the hospital-they are resistant to changing CODE STATUS and would like to continue all treatments     Antibiotics: Anti-infectives    Start     Dose/Rate Route Frequency Ordered Stop    01/06/16 1800  azithromycin (ZITHROMAX) 500 mg in dextrose 5 % 250 mL IVPB  Status:  Discontinued     500 mg 250 mL/hr over 60 Minutes Intravenous Every 24 hours 01/15/2016 1814 01/06/16 1037   01/06/16 1700  cefTRIAXone (ROCEPHIN) 1 g in dextrose 5 % 50 mL IVPB  Status:  Discontinued     1 g 100 mL/hr over 30 Minutes Intravenous Every 24 hours 01/07/2016 1813 01/06/16 1037   01/06/16 1000  doxycycline (VIBRA-TABS) tablet 100 mg     100 mg Oral Every 12 hours 01/06/16 0813     01/19/2016 1800  cefTRIAXone (ROCEPHIN) 1 g in dextrose 5 % 50 mL IVPB     1 g 100 mL/hr over 30 Minutes Intravenous  Once 01/02/2016 1750 01/22/2016 1900   01/21/2016 1800  azithromycin (ZITHROMAX) 500 mg in dextrose 5 % 250 mL IVPB     500 mg 250 mL/hr over 60 Minutes Intravenous  Once 01/09/2016 1750 01/15/2016 1930     Code Status:     Code Status Orders        Start     Ordered   01/19/2016 2325  Full code   Continuous     01/11/2016 2327    Code Status History    Date Active Date Inactive Code Status Order ID Comments User Context   03/17/2015 10:23 AM 03/18/2015  3:28 AM Full Code [id number]  Arne Cleveland, MD HOV   03/04/2015 11:37 AM 03/05/2015  3:47 AM Full Code [id number]  Arne Cleveland, MD HOV     Family Communication:  Disposition Plan: Follow-up be met tomorrow-if she is stable, she can be  discharged home-she already has hospice DVT prophylaxis: Lovenox Consultants: Palliative care Procedures:     Objective: Filed Weights   01/08/2016 1809 01/06/16 0229  Weight: 63.504 kg (140 lb) 60.7 kg (133 lb 13.1 oz)    Intake/Output Summary (Last 24 hours) at 01/06/16 1151 Last data filed at 01/06/16 1000  Gross per 24 hour  Intake  612.5 ml  Output      0 ml  Net  612.5 ml     Vitals Filed Vitals:   01/06/16 0700 01/06/16 0800 01/06/16 0831 01/06/16 1035  BP: 125/53 117/53  111/70  Pulse:    77  Temp:  96.9 F (36.1 C)  97.6 F (36.4 C)  TempSrc:  Axillary  Axillary  Resp: _0 Height:       Weight:      SpO2: 100% 100% 98% 99%    Exam:  General:  Pt is alert, not in acute distress  HEENT: No icterus, No thrush, oral mucosa moist- upper airway congestion-tenderness and left maxillary area  Cardiovascular: regular rate and rhythm, S1/S2 No murmur  Respiratory: clear to auscultation bilaterally - on room air with oxygen saturation in high 90s  Abdomen: Soft, +Bowel sounds, non tender, non distended, no guarding  MSK: No cyanosis or clubbing- no pedal edema   Data Reviewed: Basic Metabolic Panel:  Recent Labs Lab 01/03/2016 1810 01/06/16 0228  NA 135 138  K 4.2 3.9  CL 103 104  CO2 24 25  GLUCOSE 176* 135*  BUN 24* 18  CREATININE 0.44 0.37*  CALCIUM 8.0* 7.7*   Liver Function Tests:  Recent Labs Lab 12/25/2015 1810 01/06/16 0228  AST 19 17  ALT 20 19  ALKPHOS 88 80  BILITOT 0.8 0.8  PROT 7.4 6.8  ALBUMIN 3.8 3.6   No results for input(s): LIPASE, AMYLASE in the last 168 hours.  Recent Labs Lab 01/23/2016 2159  AMMONIA 58*   CBC:  Recent Labs Lab 12/29/2015 1810 01/06/16 0228  WBC 2.5* 1.7*  NEUTROABS 2.2 1.2*  HGB 12.3 11.8*  HCT 35.6* 35.8*  MCV 82.6 86.7  PLT 129* 112*   Cardiac Enzymes: No results for input(s): CKTOTAL, CKMB, CKMBINDEX, TROPONINI in the last 168 hours. BNP (last 3 results)  Recent Labs  02/21/15 1530  BNP 40.9    ProBNP (last 3 results) No results for input(s): PROBNP in the last 8760 hours.  CBG:  Recent Labs Lab 01/06/16 0757  GLUCAP 91    Recent Results (from the past 240 hour(s))  MRSA PCR Screening     Status: None   Collection Time: 01/06/16  2:38 AM  Result Value Ref Range Status   MRSA by PCR NEGATIVE NEGATIVE Final    Comment:        The GeneXpert MRSA Assay (FDA approved for NASAL specimens only), is one component of a comprehensive MRSA colonization surveillance program. It is not intended to diagnose MRSA infection nor to guide or monitor treatment for MRSA infections.       Studies: Ct Head Wo Contrast  12/26/2015  CLINICAL DATA:  Acute onset of altered mental status and apnea. Generalized weakness and confusion. Initial encounter. EXAM: CT HEAD WITHOUT CONTRAST TECHNIQUE: Contiguous axial images were obtained from the base of the skull through the vertex without intravenous contrast. COMPARISON:  MRI of the brain performed 12/24/2015, and CT of the head performed 02/21/2015 FINDINGS: Scattered areas of hypoattenuation reflect the patient's known diffuse brain metastases. These are better characterized on  recent prior MRI. There is no evidence of hemorrhagic transformation. No acute infarct is seen. Scattered periventricular white matter change likely reflects small vessel ischemic microangiopathy. Mild cortical volume loss is noted. Mild cerebellar atrophy is seen. No midline shift is seen. There is no evidence of fracture; visualized osseous structures are unremarkable in appearance. The orbits are within normal limits. The paranasal sinuses and mastoid air cells are well-aerated. No significant soft tissue abnormalities are seen. IMPRESSION: 1. Scattered areas of hypoattenuation reflect the patient's known diffuse brain metastases. These are better characterized on recent prior MRI. 2. Scattered small vessel ischemic microangiopathy and mild cortical volume loss. Electronically Signed   By: Garald Balding M.D.   On: 01/22/2016 21:52   Ct Angio Chest Pe W/cm &/or Wo Cm  01/14/2016  CLINICAL DATA:  Difficulty breathing the past 3 days. Coughing with sputum. Admitted yesterday to hospice. Increased weakness with shaking. EXAM: CT ANGIOGRAPHY CHEST WITH CONTRAST TECHNIQUE: Multidetector CT imaging of the chest was performed using the standard protocol during bolus administration of intravenous contrast. Multiplanar CT image reconstructions and MIPs were obtained to evaluate the vascular anatomy. CONTRAST:  154m OMNIPAQUE IOHEXOL 350 MG/ML SOLN COMPARISON:  Chest x-ray from  earlier same day and chest CT dated 12/15/2015. FINDINGS: Anterior mediastinal mass measuring 5.6 x 3.4 cm, stable compared to the most recent chest CT. By report, this measured 4.6 x 2.7 cm on 09/22/2015. Lower right paratracheal lymph node is stable measuring 1.9 cm short axis dimension. Additional nodular mass within the aorta pulmonary window region is stable at 1.4 cm short axis dimension. No new masses or enlarged lymph nodes within the mediastinum or perihilar regions. The majority of the peripheral segmental and subsegmental pulmonary arteries cannot be definitively characterized due to patient breathing motion artifact. There is no central obstructing pulmonary embolism identified within the main, lobar or central segmental pulmonary arteries. Thoracic aorta is normal in caliber and configuration. No aortic aneurysm or dissection. Heart size is upper normal. No pericardial effusion. Lungs are clear. No evidence of pneumonia. No pleural effusion. No pneumothorax. Trachea and central bronchi are unremarkable. Limited images of the upper abdomen are unremarkable. Left breast mass is stable in appearance, measuring approximately 2.8 cm greatest dimension, compatible with patient's history of breast cancer and/or postsurgical change. No acute osseous abnormality appreciated. Degenerative changes again noted within the thoracic spine. Irregular sclerosis and lucency within the sternum is stable and may be related to post treatment change. Review of the MIP images confirms the above findings. IMPRESSION: 1. Stable exam. The mediastinal masses and lymphadenopathy are stable compared to the most recent chest CT of 12/15/2015, although progression was noted in the last CT report when compared to an earlier CT of 09/22/2015. 2. No acute findings. No pulmonary embolism seen, with study limitations detailed above. Lungs are clear. Electronically Signed   By: SFranki CabotM.D.   On: 01/19/2016 22:00   Dg Chest Port 1  View  01/12/2016  CLINICAL DATA:  Increased weakness, worsened breathing, increased sputum. History of left breast cancer with brain metastases. EXAM: PORTABLE CHEST 1 VIEW COMPARISON:  Chest x-ray dated 02/21/2015. Comparison also made to chest CT of 12/15/2015. FINDINGS: Cardiomediastinal silhouette appears stable in size and configuration. Some fullness at the left hilum is likely related to the metastatic lymphadenopathy identified within the mediastinum on earlier chest CT. Right chest wall Port-A-Cath now in place with tip well-positioned at the level of the lower SVC. Lungs are clear. No pleural effusion. No  pneumothorax. Osseous structures about the chest are unremarkable. IMPRESSION: Fullness in the left hilum likely related to the metastatic lymphadenopathy identified within the mediastinum on earlier chest CT. Lungs are clear and there is no evidence of acute cardiopulmonary abnormality. No evidence of pneumonia. Electronically Signed   By: Franki Cabot M.D.   On: 01/01/2016 18:45    Scheduled Meds:  Scheduled Meds: . dexamethasone  4 mg Oral TID  . doxycycline  100 mg Oral Q12H  . enoxaparin (LOVENOX) injection  40 mg Subcutaneous Q24H  . sodium chloride flush  3 mL Intravenous Q12H   Continuous Infusions:   Time spent on care of this patient: 27mn   Verlan Grotz, MD 01/06/2016, 11:51 AM  LOS: 1 day   [facility] Office  [phone] - Text Page per [email or web address] If 7PM-7AM, please contact night-coverage [email or web address]

## 2016-01-06 NOTE — Progress Notes (Signed)
Nutrition Brief Note  Patient identified via the Malnutrition Screening Tool (MST) Report.  Patient with left breast cancer and known metastases to the liver, lung, and brain.  Currently receiving palliative whole-brain radiation and home hospice services.  Per [facility] note, patient with poor prognosis and is approaching EOL.  Dietetic intern attempted to see patient for assessment.  Spoke with RN who states that patient and family are currently meeting with Palliative Care.  Based on [place] and decision will follow up to complete assessment at a later time.   Veronda Prude, Dietetic Intern Pager: [phone]

## 2016-01-06 NOTE — Progress Notes (Signed)
Transferred to 1340 via bed. Family at bedside.

## 2016-01-06 NOTE — Progress Notes (Signed)
[facility]-GIP RN Visit  This is a GIP related, covered admission from 01/06/16 to her [facility] diagnosis of Breast Cancer.  This patient was admitted to hospice services 01/04/16. Patient was brought to ED from her Radiation Oncology appointment late yesterday afternoon, after experiencing respiratory distress.  [facility] RN visited patient yesterday afternoon and discussed the patient's poor prognosis at length.  The family had verbalized understanding that the changes seen in the patient, including her respirations, appear that the patient is approaching EOL.  At that time, family desired to keep the patient a Full Code and wanted any life prolonging measures to be performed.  Patient seen in room, with son at bedside.  Patient opened eyes with verbal stimulation, however, fell quickly back asleep.  Periods of apnea noted of approximately 10-15 seconds.  Her breathing sounds congested and is irregular.  She is on RA with O2 sats at 99%.  Patient is receiving Decadron 4 mg po TID and Doxycycline 100 mg po BID.  She has received 1 dose of 5 mg Roxanol po for severe pain this morning.  Son stated that the patient has continued to cough up some yellow mucous.  She ate approximately 50% of her food tray this morning. Son reported that he plans on speaking with PMT shortly to discuss his mother's condition.  Appreciate PMT assistance.  [facility] will continue to follow daily.  Updated [facility] medication list and transfer summary placed on chart.  Please call with any hospice-related questions or concerns.  Thank you, Freddi Starr RN, [facility] Liaison [phone]

## 2016-01-06 NOTE — Consult Note (Signed)
Consultation Note Date: 01/06/2016   Patient Name: Michele Hale  DOB: September 05, 1946  MRN: [id number]  Age / Sex: 70 y.o., female  PCP: Tresa Garter, MD Referring Physician: Debbe Odea, MD  Reason for Consultation: Establishing goals of care and Psychosocial/spiritual support  Clinical Assessment/Narrative:  Michele Hale is an unfortunate 70 y.o. female with PMH of left breast cancer, widely metastatic,  presenting to the emergency department with respiratory distress and decreased  level of consciousness.  In ED, patient was found to be afebrile, requiring supplemental oxygen to maintain saturation in the 90s, and with vital signs otherwise stable. There are no acute findings on chest CT, with no suggestion of pneumonia, and no significant progression of metastatic lesions since the recent prior CT chest. Head CT is also negative for acute findings, but re-demonstrates the diffuse brain metastases.  Patient remained hemodynamically stable in the emergency department and was admitted to the hospital for ongoing evaluation and management of acute hypoxic respiratory failure and rapid overall decline in this elderly female with widely metastatic breast cancer  Patient was diagnosed with breast cancer in 2011 and underwent neoadjuvant antiestrogen therapy followed by lumpectomy and radiation that year.   Patient has been seen by several different oncologist in the cancer center and in May of 2016 was began oncologic care under Dr Lindi Adie.   Recent CT CAP 12/15/15: Progression of mets in mediastinum 3.6 X 6.1 cm (was 2.7 x 4.6 cm), new large lytic lesion Rt ileum, 2.9 cm mass left breast same as before, manubrium and sternum stable.   Treatment Plan: Ibrance with Letrozole     Recent Head CT showed extensive metastatic brain disease and is seen by Dr Sondra Come for radiation tx.  Home hospice services were started two  days ago with [facility] but her son tells me that was a mistake and he does not agree with their philosophy.  "all I needed was a hospital bed and they sent hospice"  This NP Wadie Lessen reviewed medical records, received report from team, assessed the patient and then meet at the patient's bedside along with two sons, Laqueta Carina and another brother/understood the conversation but couldn't converse in [place] to discuss diagnosis,  prognosis, GOC, EOL wishes disposition and options.   A detailed discussion was had today regarding advanced directives.  Concepts specific to code status, artifical feeding and hydration, continued IV antibiotics and rehospitalization was had.  The difference between a aggressive medical intervention path  and a palliative comfort care path for this patient at this time was had. Values and goals of care important to patient and family were attempted to be elicited.  Natural trajectory and expectations at EOL were discussed, and belief that the patient's prognosis was likely days to weeks.  This was a difficult discussion.  Patient's sons initially focused on questions and concerns about "how could this happen", " there must be something some doctor here can do"   eluding to suboptimal care over the the years of treatment here at the cancer center.  This NP attempted to offer basic education regarding basic cancer pathophysiology and treatment, within the context of medical limitations and mortality.   Continued push on wanting, "some answers", family educated that they are entitled   to requests her medical records for review.  I addressed their questions to the best of my knowledge  Questions and concerns addressed. Space created for son to share his mother's story; mother of nine children and like a mother to so many  people in the [place] who would stay in her home so they could pursue education and careers.     Hard Choices booklet left for review. Family encouraged to  call with questions or concerns.  PMT will continue to support holistically.   Primary Decision Maker: son/ Designer, fashion/clothing    SUMMARY OF RECOMMENDATIONS  -full code, family desires all offered and available medical interventions to prolong life, "I am not just going to say let my mother die" -open to all offered and available chemotherapy and radiation -open to continued conversation and supprot with this NP, will f/u in the morning   Code Status/Advance Care Planning:  Full code      Code Status Orders        Start     Ordered   01/09/2016 2325  Full code   Continuous     12/26/2015 2327    Code Status History    Date Active Date Inactive Code Status Order ID Comments User Context   03/17/2015 10:23 AM 03/18/2015  3:28 AM Full Code [id number]  Arne Cleveland, MD [place]   03/04/2015 11:37 AM 03/05/2015  3:47 AM Full Code [id number]  Arne Cleveland, MD [facility]      Other Directives:None  Symptom Management:    Dyspnea/Pain: Roxanol 5 mg po/sl every 2 hrs prn   Palliative Prophylaxis:    Aspiration, Bowel Regimen, Frequent Pain Assessment and Oral Care   Psycho-social/Spiritual:  Support System: Fair  Additional Recommendations: Education on Hospice  Prognosis: < 2 weeks  Discharge Planning: Pending   Chief Complaint/ Primary Diagnoses: Present on Admission:  . Cancer of left breast metastatic to brain ([place]) . Acute respiratory failure with hypoxia ([place]) . Antineoplastic chemotherapy induced pancytopenia ([place])  I have reviewed the medical record, interviewed the patient and family, and examined the patient. The following aspects are pertinent.  Past Medical History  Diagnosis Date  . Abscess   . IBS (irritable bowel syndrome)   . Breast mass   . Osteoporosis   . Cancer ([place])     breast - lt  . Radiation 01/13/2011-02/17/2011    left breast 50 Gy  . Brain metastases [facility])    Social History   Social History  . Marital Status: Single    Spouse Name: N/A  .  Number of Children: N/A  . Years of Education: N/A   Social History Main Topics  . Smoking status: Never Smoker   . Smokeless tobacco: None  . Alcohol Use: No  . Drug Use: No  . Sexual Activity: Not Currently   Other Topics Concern  . None   Social History Narrative   Family History  Problem Relation Age of Onset  . Cancer Father     liver  . Cancer Mother     liver   Scheduled Meds: . dexamethasone  4 mg Oral TID  . doxycycline  100 mg Oral Q12H  . enoxaparin (LOVENOX) injection  40 mg Subcutaneous Q24H  . sodium chloride flush  3 mL Intravenous Q12H   Continuous Infusions:  PRN Meds:.acetaminophen **OR** acetaminophen, bisacodyl, HYDROcodone-acetaminophen, LORazepam, morphine CONCENTRATE, ondansetron **OR** ondansetron (ZOFRAN) IV, polyethylene glycol Medications Prior to Admission:  Prior to Admission medications   Medication Sig Start Date End Date Taking? Authorizing Provider  acetaminophen (TYLENOL) 500 MG tablet Take 1,000 mg by mouth every 6 (six) hours as needed. For arthritis pain   Yes Historical Provider, MD  dexamethasone (DECADRON) 4 MG tablet Take 4 mg by mouth 3 (three) times  daily. Called in to [facility] on 12/30/15.  Disp. 60 tablets. 0 refills.   Yes Gery Pray, MD  lidocaine-prilocaine (EMLA) cream Apply to port-a-cath one and a half hours before use.  Cover cream with plastic wrap to seal. 06/11/15  Yes Nicholas Lose, MD  morphine (ROXANOL) 20 MG/ML concentrated solution Take 5 mg by mouth every 2 (two) hours as needed for severe pain.   Yes Historical Provider, MD  promethazine (PHENERGAN) 25 MG tablet Take 1 tablet (25 mg total) by mouth every 6 (six) hours as needed for nausea or vomiting. 11/19/15  Yes Nicholas Lose, MD  albuterol (PROVENTIL) (2.5 MG/3ML) 0.083% nebulizer solution Take 3 mLs (2.5 mg total) by nebulization every 4 (four) hours as needed for wheezing or shortness of breath. Patient not taking: Reported on 12/29/2015  01/04/2016   Nicholas Lose, MD  cholecalciferol (VITAMIN D) 1000 UNITS tablet Take 1 tablet (1,000 Units total) by mouth daily. Patient not taking: Reported on 12/30/2015 01/23/13   Consuela Mimes, MD  HYDROcodone-acetaminophen (NORCO/VICODIN) 5-325 MG per tablet Take 1-2 tablets by mouth every 4 (four) hours as needed. Patient not taking: Reported on 12/27/2015 02/21/15   Wandra Arthurs, MD  ibuprofen (ADVIL,MOTRIN) 800 MG tablet Take 1 tablet (800 mg total) by mouth every 8 (eight) hours as needed. Patient not taking: Reported on 12/30/2015 03/26/15   Nicholas Lose, MD  loratadine (CLARITIN) 10 MG tablet Take 1 tablet (10 mg total) by mouth daily. Patient not taking: Reported on 12/31/2015 12/25/13   Lorayne Marek, MD  LORazepam (ATIVAN) 0.5 MG tablet Take 1 tablet (0.5 mg total) by mouth every 4 (four) hours as needed for anxiety. Patient not taking: Reported on 12/24/2015 12/29/2015   Nicholas Lose, MD  magnesium oxide (MAG-OX) 400 (241.3 MG) MG tablet Take 1 tablet (400 mg total) by mouth daily. Patient not taking: Reported on 01/15/2016 10/15/15   Nicholas Lose, MD   No Known Allergies  Review of Systems  Unable to perform ROS   Physical Exam  Constitutional: She appears cachectic. She appears ill.  Minimally responsive  Cardiovascular: Tachycardia present.   Respiratory: Tachypnea noted. She has decreased breath sounds in the right lower field and the left lower field.  Musculoskeletal:       Right shoulder: She exhibits decreased strength.  Skin: Skin is warm and dry.    Vital Signs: BP 111/70 mmHg  Pulse 77  Temp(Src) 97.6 F (36.4 C) (Axillary)  Resp 18  Ht 5\' 4"  (1.626 m)  Wt 60.7 kg (133 lb 13.1 oz)  BMI 22.96 kg/m2  SpO2 99%  SpO2: SpO2: 99 % O2 Device:SpO2: 99 % O2 Flow Rate: .O2 Flow Rate (L/min): 2 L/min  IO: Intake/output summary:  Intake/Output Summary (Last 24 hours) at 01/06/16 1314 Last data filed at 01/06/16 1000  Gross per 24 hour  Intake  612.5 ml  Output      0 ml  Net   612.5 ml    LBM:   Baseline Weight: Weight: 63.504 kg (140 lb) Most recent weight: Weight: 60.7 kg (133 lb 13.1 oz)      Palliative Assessment/Data:  Flowsheet Rows        Most Recent Value   Intake Tab    Referral Department  Hospitalist   Unit at Time of Referral  ICU   Palliative Care Primary Diagnosis  Cancer   Date Notified  01/08/2016   Palliative Care Type  New Palliative care   Reason for referral  Clarify Goals of Care, End of Life Care Assistance   Date of Admission  01/09/2016   Date first seen by Palliative Care  01/06/16   # of days Palliative referral response time  1 Day(s)   # of days IP prior to Palliative referral  0   Clinical Assessment    Psychosocial & Spiritual Assessment    Palliative Care Outcomes       Additional Data Reviewed:  CBC:    Component Value Date/Time   WBC 1.7* 01/06/2016 0228   WBC 5.3 12/17/2015 0905   HGB 11.8* 01/06/2016 0228   HGB 11.6 12/17/2015 0905   HCT 35.8* 01/06/2016 0228   HCT 36.2 12/17/2015 0905   PLT 112* 01/06/2016 0228   PLT 190 12/17/2015 0905   MCV 86.7 01/06/2016 0228   MCV 86.8 12/17/2015 0905   NEUTROABS 1.2* 01/06/2016 0228   NEUTROABS 4.0 12/17/2015 0905   LYMPHSABS 0.5* 01/06/2016 0228   LYMPHSABS 0.9 12/17/2015 0905   MONOABS 0.1 01/06/2016 0228   MONOABS 0.3 12/17/2015 0905   EOSABS 0.0 01/06/2016 0228   EOSABS 0.0 12/17/2015 0905   BASOSABS 0.0 01/06/2016 0228   BASOSABS 0.0 12/17/2015 0905   Comprehensive Metabolic Panel:    Component Value Date/Time   NA 138 01/06/2016 0228   NA 141 12/17/2015 0905   K 3.9 01/06/2016 0228   K 4.5 12/17/2015 0905   CL 104 01/06/2016 0228   CL 104 01/23/2013 1420   CO2 25 01/06/2016 0228   CO2 28 12/17/2015 0905   BUN 18 01/06/2016 0228   BUN 9.3 12/17/2015 0905   CREATININE 0.37* 01/06/2016 0228   CREATININE 0.7 12/17/2015 0905   GLUCOSE 135* 01/06/2016 0228   GLUCOSE 106 12/17/2015 0905   GLUCOSE 150* 01/23/2013 1420   CALCIUM 7.7* 01/06/2016 0228     CALCIUM 9.7 12/17/2015 0905   AST 17 01/06/2016 0228   AST 14 12/17/2015 0905   ALT 19 01/06/2016 0228   ALT <9 12/17/2015 0905   ALKPHOS 80 01/06/2016 0228   ALKPHOS 104 12/17/2015 0905   BILITOT 0.8 01/06/2016 0228   BILITOT 0.31 12/17/2015 0905   PROT 6.8 01/06/2016 0228   PROT 7.5 12/17/2015 0905   ALBUMIN 3.6 01/06/2016 0228   ALBUMIN 3.5 12/17/2015 [id number]   Discussed with Dr Wynelle Cleveland  Time In: 1400 Time Out: 1545 Time Total: 105 min Greater than 50%  of this time was spent counseling and coordinating care related to the above assessment and plan.  Signed by: Wadie Lessen, NP  Michele Royalty, NP  01/06/2016, 1:14 PM  Please contact Palliative Medicine Team phone at [phone] for questions and concerns.

## 2016-01-06 NOTE — Progress Notes (Signed)
  Radiation Oncology         [phone] ________________________________  Name: Michele Hale MRN: [id number]  Date: 01/06/2016  DOB: 05-Jul-1946  Weekly Radiation Therapy Management -     ICD-9-CM ICD-10-CM   1. Brain metastasis (HCC) 198.3 C79.31      Current Dose: 12 Gy     Planned Dose:  30 Gy  Narrative . . . . . . . . The patient presents for routine under treatment assessment.                                   The patient was admitted to the hospital yesterday after presenting for her radiation therapy with breathing issues. According to family members she seems to be a little more alert today                                 Set-up films were reviewed.                                 The chart was checked. Physical Findings. . . The patient responds to questioning however she does not understand English Impression . . . . . . . The patient is tolerating radiation. Plan . . . . . . . . . . . . Continue treatment as planned. Family members wish to be aggressive with her management and continue her planned course of whole brain radiation therapy at this time. They do feel this would be the patient's desire if she were able to communicate.  ________________________________   Blair Promise, PhD, MD

## 2016-01-06 NOTE — Progress Notes (Signed)
[facility] MSW note: Patient was admitted to [facility] services on 01/04/16. Pt lives with son and daughter in law. Pt is from [place] but has lived in the [place] since 2011. Pt does not speak [place]. Pt is a full code. MSW met with son-Alhafivhafan Mohamed. Son reported that he was told pt may have 3-4 days to live. Son stated that "Only God knows the time". Son is realistic to pt's decline. However, son desires to keep pt a full code at this time despite MSW providing education on what that may mean. Son stated that he has a Palliative Care consult @2pm  today. MSW encouraged son to discuss pt's condition and future with Palliative Care physician. MSW explained that [employer] is an option for end of life care but pt will need to be DNR. Son is requesting a letter from [facility] to send to [place] [employer] in [place] to help get daughter here to see pt. MSW encouraged son to also use face time , skype, or phone call so daughter can have immediate contact with pt. No set funeral plans but family will do burial. Son was able to state that he plans for the worst but hopes for the best. MSW discussed case with Lanetta Inch, RN liaison.   Marilynne Halsted, MSW

## 2016-01-06 NOTE — Telephone Encounter (Signed)
Samuel Jester, RN on [place] to see if patient wants radiation today.  Per Rollene Fare, her family does want her to continue radiation treatment today.  Notified Heather, RT on [place].

## 2016-01-06 NOTE — Progress Notes (Signed)
HEMATOLOGY-ONCOLOGY PROGRESS NOTE  SUBJECTIVE: Patient is adm with resp distress and is currently undergoing brain XRT for brain mets. We had lengthy discussions with family using an interpretor regarding the family wishes. Patients breathing is better.  OBJECTIVE: REVIEW OF SYSTEMS:   Constitutional: Denies fevers, chills or abnormal weight loss Eyes: Denies blurriness of vision Ears, nose, mouth, throat, and face: Denies mucositis or sore throat Respiratory: Denies cough, dyspnea or wheezes Cardiovascular: Denies palpitation, chest discomfort Gastrointestinal:  Denies nausea, heartburn or change in bowel habits Skin: Denies abnormal skin rashes Lymphatics: Denies new lymphadenopathy or easy bruising Neurological:confusion Behavioral/Psych: Mood is stable, no new changes  Extremities: No lower extremity edema  All other systems were reviewed with the patient and are negative.  I have reviewed the past medical history, past surgical history, social history and family history with the patient and they are unchanged from previous note.   PHYSICAL EXAMINATION: ECOG PERFORMANCE STATUS: 3  Filed Vitals:   01/06/16 1035 01/06/16 2032  BP: 111/70 96/74  Pulse: 77 79  Temp: 97.6 F (36.4 C) 98 F (36.7 C)  Resp: 18 18   Filed Weights   12/26/2015 1809 01/06/16 0229  Weight: 140 lb (63.504 kg) 133 lb 13.1 oz (60.7 kg)    GENERAL:alert, no distress and comfortable SKIN: skin color, texture, turgor are normal, no rashes or significant lesions EYES: normal, Conjunctiva are pink and non-injected, sclera clear OROPHARYNX:no exudate, no erythema and lips, buccal mucosa, and tongue normal  NECK: supple, thyroid normal size, non-tender, without nodularity LYMPH:  no palpable lymphadenopathy in the cervical, axillary or inguinal LUNGS: faint crackles HEART: regular rate & rhythm and no murmurs and no lower extremity edema ABDOMEN:abdomen soft, non-tender and normal bowel  sounds Musculoskeletal:no cyanosis of digits and no clubbing  NEURO: alert & oriented x 3 , no focal motor/sensory deficits  LABORATORY DATA:  I have reviewed the data as listed CMP Latest Ref Rng 01/06/2016 01/02/2016 12/17/2015  Glucose 65 - 99 mg/dL 135(H) 176(H) 106  BUN 6 - 20 mg/dL 18 24(H) 9.3  Creatinine 0.44 - 1.00 mg/dL 0.37(L) 0.44 0.7  Sodium 135 - 145 mmol/L 138 135 141  Potassium 3.5 - 5.1 mmol/L 3.9 4.2 4.5  Chloride 101 - 111 mmol/L 104 103 -  CO2 22 - 32 mmol/L 25 24 28   Calcium 8.9 - 10.3 mg/dL 7.7(L) 8.0(L) 9.7  Total Protein 6.5 - 8.1 g/dL 6.8 7.4 7.5  Total Bilirubin 0.3 - 1.2 mg/dL 0.8 0.8 0.31  Alkaline Phos 38 - 126 U/L 80 88 104  AST 15 - 41 U/L 17 19 14   ALT 14 - 54 U/L 19 20 <9    Lab Results  Component Value Date   WBC 1.7* 01/06/2016   HGB 11.8* 01/06/2016   HCT 35.8* 01/06/2016   MCV 86.7 01/06/2016   PLT 112* 01/06/2016   NEUTROABS 1.2* 01/06/2016    ASSESSMENT AND PLAN: 1. Metastatic Breast Cancer: With brain mets and bone and lung mets: Progressed on chemo and developed brain mets on Ibrance. I recommended Hospice care since no further chemo would be of any significant benefit. The family had many questions regarding hospice. They would like her tp be a facility to get hospice care services We spent a lot of time discussing nutrition at end of life. Patients family was of the opinion that she should get tube feeds and we talked for half an hour about the futility of such measures. They will discuss with hospice care team

## 2016-01-06 NOTE — Progress Notes (Addendum)
Pharmacy Brief note:  Palbociclib Michele Hale)  By [place] Michele Hale) is automatically held when any of the following occur:  Grade 3 or 4 hematologic toxicity (except lymphopenia unless associated with clinical events such as opportunistic infections)  Grade 3 ANC [phone]) + fever >38.5 and/or infection  Grade >=3 non-hematologic toxicity persisting despite medical treatment  Pt admitted with acute hypoxic respiratory failure- being treated for CAP. Palbociclib has been held (discontinued from profile) per policy. Will f/u with MD this am  Thanks Dorrene German 01/06/2016 2:35 AM

## 2016-01-07 ENCOUNTER — Ambulatory Visit: Payer: Medicaid Other | Admitting: Hematology and Oncology

## 2016-01-07 ENCOUNTER — Other Ambulatory Visit: Payer: Medicaid Other

## 2016-01-07 ENCOUNTER — Ambulatory Visit: Payer: Medicaid Other

## 2016-01-07 ENCOUNTER — Telehealth: Payer: Self-pay | Admitting: Oncology

## 2016-01-07 ENCOUNTER — Ambulatory Visit: Admission: RE | Admit: 2016-01-07 | Payer: Medicaid Other | Source: Ambulatory Visit

## 2016-01-07 DIAGNOSIS — Z515 Encounter for palliative care: Secondary | ICD-10-CM | POA: Insufficient documentation

## 2016-01-07 DIAGNOSIS — R1310 Dysphagia, unspecified: Secondary | ICD-10-CM

## 2016-01-07 DIAGNOSIS — J690 Pneumonitis due to inhalation of food and vomit: Secondary | ICD-10-CM

## 2016-01-07 DIAGNOSIS — Z7189 Other specified counseling: Secondary | ICD-10-CM | POA: Insufficient documentation

## 2016-01-07 LAB — URINE CULTURE

## 2016-01-07 LAB — BASIC METABOLIC PANEL
Anion gap: 9 (ref 5–15)
BUN: 21 mg/dL — AB (ref 6–20)
CHLORIDE: 107 mmol/L (ref 101–111)
CO2: 23 mmol/L (ref 22–32)
Calcium: 7.1 mg/dL — ABNORMAL LOW (ref 8.9–10.3)
Creatinine, Ser: 0.37 mg/dL — ABNORMAL LOW (ref 0.44–1.00)
GFR calc non Af Amer: >60 mL/min (ref >60)
Glucose, Bld: 147 mg/dL — ABNORMAL HIGH (ref 65–99)
POTASSIUM: 4.1 mmol/L (ref 3.5–5.1)
SODIUM: 139 mmol/L (ref 135–145)

## 2016-01-07 LAB — CBC
HEMATOCRIT: 32.3 % — AB (ref 36.0–46.0)
Hemoglobin: 11 g/dL — ABNORMAL LOW (ref 12.0–15.0)
MCH: 28.4 pg (ref 26.0–34.0)
MCHC: 34.1 g/dL (ref 30.0–36.0)
MCV: 83.2 fL (ref 78.0–100.0)
Platelets: 112 10*3/uL — ABNORMAL LOW (ref 150–400)
RBC: 3.88 MIL/uL (ref 3.87–5.11)
RDW: 15.2 % (ref 11.5–15.5)
WBC: 1.8 10*3/uL — AB (ref 4.0–10.5)

## 2016-01-07 MED ORDER — ACETYLCYSTEINE 20 % IN SOLN
4.0000 mL | Freq: Once | RESPIRATORY_TRACT | Status: DC
  Filled 2016-01-07: qty 4

## 2016-01-07 MED ORDER — SODIUM CHLORIDE 0.9 % IV SOLN
3.0000 g | Freq: Three times a day (TID) | INTRAVENOUS | Status: DC
  Administered 2016-01-07 – 2016-01-10 (×9): 3 g via INTRAVENOUS
  Filled 2016-01-07 (×12): qty 3

## 2016-01-07 MED ORDER — ATROPINE SULFATE 1 % OP SOLN
2.0000 [drp] | Freq: Four times a day (QID) | OPHTHALMIC | Status: DC
  Administered 2016-01-07 – 2016-01-10 (×12): 2 [drp] via SUBLINGUAL
  Filled 2016-01-07 (×2): qty 2

## 2016-01-07 NOTE — Progress Notes (Signed)
Called to patient room for one time inhaled mucomyst order. Upon arrival to the room, RN agrees the patient is "better" than when she previously contacted to NP for an order to alleviate secretions. Patient is in NAD and has stable VS. She sounds better, audibly, than when I last visited with her in the ICU. Son remains at the bedside, asleep. Patient now resting. At this time, I do not think mucomyst via nebulizer is warranted. Awaiting return call from NP for clarification. RN aware.

## 2016-01-07 NOTE — Telephone Encounter (Signed)
Called Tamee's son Gillian Scarce), and verified that the patient and family wants to stop radiation treatments.  He said that he had discussed radiation with Dr. Lindi Adie and decided that it was not beneficial and would like to stop treatments.

## 2016-01-07 NOTE — Progress Notes (Signed)
[facility] MSW note: This is a hospice related Admission. Met with 2 sons. Pt was lethargic on visit. Dr. Carles Collet and Wadie Lessen, NP present on visit. Sons educated on pt's condition and poor prognosis. Sons continue to want pt to be a full code. Son-Michele Hale stated that a dead person is when the soul leaves the body but when you are alive you treat. Sons desire to stop radiation as they were told it was not beneficial by Dr. Lindi Adie. However, sons do want to "treat the treatable". Son desire pt to be evaluated for a feeding tube despite knowing pt may not be candidate. Sons also desire for pt to be treated for the aspiration pneumonia. Sons have been made aware of pt's poor prognosis of days to week by Dt. Tat, NP and this MSW. MSW did type up for [place] [employer] on behalf of pt's daughter in [place]. MSW offered support and education.  Marilynne Halsted, MSW

## 2016-01-07 NOTE — Progress Notes (Signed)
PROGRESS NOTE  Michele Hale SG:[id number] DOB: [birth year] DOA: 01/14/2016 PCP: Angelica Chessman, MD Brief History 71 y.o. female with PMH of left breast cancer, widely metastatic, now presenting to the emergency department with respiratory distress and depressed level of consciousness. Patient was diagnosed with breast cancer in 2012 and underwent neoadjuvant antiestrogen therapy followed by lumpectomy and radiation that year. She was diagnosed with metastatic disease in May 2016 and has known metastases to the liver, lung, and brain. She has been receiving palliative whole-brain radiation and home hospice services. Patient's condition has been rapidly declining. Her breathing has become more labored and she is less and less responsive. In ED, patient was found to be afebrile, requiring supplemental oxygen to maintain saturation in the 90s, and with vital signs otherwise stable. There are no acute findings on chest CT, with no suggestion of pneumonia, and no significant progression of metastatic lesions since the recent prior CT chest. Head CT is also negative for acute findings, but re-demonstrates the diffuse brain metastases Assessment/Plan: Acute respiratory failure with hypoxia -Suspect the patient has underlying aspiration secondary to her depressed mental status -Certainly, the patient's opioids and have not medications may be contributing -start unasyn  Acute encephalopathy -Multifactorial including known brain metastasis, possible steroids, opioid and hypnotic medications, and dehydration -not much change clinically -Patient's family is also having difficulty understanding what the patient is trying to say due to her lethargy  Metastatic breast cancer -Appreciate Dr. Lindi Adie input--recommended hospice care -cancer has progressed on chemo  Pancytopenia -may be due to marrow infiltration from metastatic cancer/chemo -check B12, RBC folate -hep c antibody, hep B surface  antigen  Goals of Care -remains FULL CODE -poor prognosis -appreciate palliative medicine followup  -01/07/16--case discussed with palliative Jenene Slicker, NP   Family Communication:   Son updated at beside-- Disposition Plan:   Home 1-2 days Time spent--35 min, >50% spent counseling and coordinating care       Procedures/Studies: Ct Head Wo Contrast  12/28/2015  CLINICAL DATA:  Acute onset of altered mental status and apnea. Generalized weakness and confusion. Initial encounter. EXAM: CT HEAD WITHOUT CONTRAST TECHNIQUE: Contiguous axial images were obtained from the base of the skull through the vertex without intravenous contrast. COMPARISON:  MRI of the brain performed 12/24/2015, and CT of the head performed 02/21/2015 FINDINGS: Scattered areas of hypoattenuation reflect the patient's known diffuse brain metastases. These are better characterized on recent prior MRI. There is no evidence of hemorrhagic transformation. No acute infarct is seen. Scattered periventricular white matter change likely reflects small vessel ischemic microangiopathy. Mild cortical volume loss is noted. Mild cerebellar atrophy is seen. No midline shift is seen. There is no evidence of fracture; visualized osseous structures are unremarkable in appearance. The orbits are within normal limits. The paranasal sinuses and mastoid air cells are well-aerated. No significant soft tissue abnormalities are seen. IMPRESSION: 1. Scattered areas of hypoattenuation reflect the patient's known diffuse brain metastases. These are better characterized on recent prior MRI. 2. Scattered small vessel ischemic microangiopathy and mild cortical volume loss. Electronically Signed   By: Garald Balding M.D.   On: 12/28/2015 21:52   Ct Soft Tissue Neck W Contrast  12/15/2015  CLINICAL DATA:  Left metastatic breast cancer diagnosed in 2011. Ongoing chemotherapy. Difficulty swallowing for 2 weeks. EXAM: CT NECK WITH CONTRAST  TECHNIQUE: Multidetector CT imaging of the neck was performed using the standard protocol following the bolus administration of intravenous contrast. CONTRAST:  18mL OMNIPAQUE IOHEXOL 300 MG/ML SOLN, 136mL OMNIPAQUE IOHEXOL 300 MG/ML SOLN COMPARISON:  PET-CT 02/28/2015 FINDINGS: Pharynx and larynx: Asymmetric larynx. The left arytenoid is medialized. No evidence of pharyngeal based mass lesion. Salivary glands: Negative Thyroid: Numerous small bilateral nodules, considered incidental. Lymph nodes: Enlarged nodes at diagnosis have significantly decreased in size. No enlarged necrotic appearing lymph nodes in the neck. No mass displacing the cervical esophagus. Vascular: Atherosclerosis without evidence of flow limiting stenosis. Limited intracranial: 6 mm focus of enhancement along the floor of the fourth ventricle, left of midline. No associated edema or spur thick ventricular effacement. Visualized orbits: Negative Mastoids and visualized paranasal sinuses: Clear Skeleton: No acute finding or aggressive lesion. Multilevel advanced facet arthropathy Upper chest: Reported separately These results will be called to the ordering clinician or representative by the Radiologist Assistant, and communication documented in the PACS or zVision Dashboard. IMPRESSION: 1. Questionable 6 mm mass in the left pons. Recommend brain MRI to evaluate for metastatic disease. 2. Asymmetric larynx, possible cord paresis on the left. 3. Supraclavicular/low cervical metastatic adenopathy seen in 2016 has improved. Currently no enlarged cervical lymph nodes. 4. Chest reported separately. Electronically Signed   By: Monte Fantasia M.D.   On: 12/15/2015 10:28   Ct Chest W Contrast  12/15/2015  CLINICAL DATA:  70 year old female with history of left-sided breast cancer diagnosed in 2011 with none metastatic disease. Ongoing chemotherapy. Difficulty swallowing for the past 2 weeks. EXAM: CT CHEST, ABDOMEN, AND PELVIS WITH CONTRAST TECHNIQUE:  Multidetector CT imaging of the chest, abdomen and pelvis was performed following the standard protocol during bolus administration of intravenous contrast. CONTRAST:  41mL OMNIPAQUE IOHEXOL 300 MG/ML SOLN, 115mL OMNIPAQUE IOHEXOL 300 MG/ML SOLN COMPARISON:  CT the chest, abdomen and pelvis 09/22/2015. PET-CT 07/18/2015. FINDINGS: CT CHEST FINDINGS Mediastinum/Lymph Nodes: Further increase in metastatic lymphadenopathy throughout the mediastinum, most evident in the anterior mediastinum where the largest enhancing nodal mass measures up to 3.5 x 6.1 cm (image 18 of series 2), previously only 2.7 x 4.6 cm on 09/22/2015. This lesion is contiguous with the left internal mammary nodal chain, which also demonstrates some enlarged lymph nodes. Marked enlargement of a low right paratracheal lymph node which currently measures 1.7 cm in short axis (previously nonenlarged). Heart size is borderline enlarged. There is no significant pericardial fluid, thickening or pericardial calcification. There is atherosclerosis of the thoracic aorta, the great vessels of the mediastinum and the coronary arteries, including calcified atherosclerotic plaque in the left main, left anterior descending and right coronary arteries. Mild calcifications of the mitral annulus and aortic valve. Esophagus is unremarkable in appearance. No axillary lymphadenopathy. Right-sided internal jugular single-lumen porta cath with tip terminating at the superior cavoatrial junction. Lungs/Pleura: No suspicious appearing pulmonary nodules or masses. No acute consolidative airspace disease. No pleural effusions. Musculoskeletal/Soft Tissues: Soft tissue mass in the central aspect of the left breast measuring up to 2.9 x 2.2 cm (image 35 of series 2), similar to prior examination. Some overlying skin thickening is noted. Irregular sclerosis and lucency is again noted in the manubrium, similar to the prior examination. There is also a small lucent lesion in the  left side of the sternum measuring 7 mm (image 23 of series 7) which is similar to prior examinations. CT ABDOMEN AND PELVIS FINDINGS Hepatobiliary: No suspicious cystic or solid hepatic lesions. No intra or extrahepatic biliary ductal dilatation. Gallbladder is normal in appearance. Pancreas: No pancreatic mass. No pancreatic ductal dilatation. No pancreatic or peripancreatic fluid or inflammatory  changes. Spleen: Unremarkable. Adrenals/Urinary Tract: Several sub cm low-attenuation lesions are noted in the kidneys bilaterally, similar to prior examinations. Although these are too small to definitively characterize, these are presumably small cysts. Bilateral adrenal glands are normal in appearance. No hydroureteronephrosis. Urinary bladder is nearly decompressed, but otherwise unremarkable in appearance. Stomach/Bowel: Normal appearance of the stomach. No pathologic dilatation of small bowel or colon. Normal appendix. Vascular/Lymphatic: Atherosclerosis throughout the abdominal and pelvic vasculature, without evidence of aneurysm or dissection. No lymphadenopathy noted in the abdomen or pelvis. Reproductive: Uterus and ovaries are atrophic. Other: No significant volume of ascites.  No pneumoperitoneum. Musculoskeletal: New osteolysis involving the superior aspect of the right ilium with some extension of tumor into the overlying right gluteal and iliacus musculature. 12 mm of anterolisthesis of L5 upon S1. Bilateral pars defects at L5. IMPRESSION: 1. Today's study demonstrates progression of metastatic disease, with interval increase in mediastinal lymphadenopathy, and new development of a large lytic lesion in the superior aspect of the right ilium. 2. 2.9 x 2.2 cm mass in the central aspect of the left breast is similar to prior examinations. 3. Irregularities in the manubrium and sternum, similar to prior examinations, potentially either treatment related or treated metastatic disease. 4. Additional incidental  findings, similar to prior examinations, as above. Electronically Signed   By: Vinnie Langton M.D.   On: 12/15/2015 10:37   Ct Angio Chest Pe W/cm &/or Wo Cm  01/13/2016  CLINICAL DATA:  Difficulty breathing the past 3 days. Coughing with sputum. Admitted yesterday to hospice. Increased weakness with shaking. EXAM: CT ANGIOGRAPHY CHEST WITH CONTRAST TECHNIQUE: Multidetector CT imaging of the chest was performed using the standard protocol during bolus administration of intravenous contrast. Multiplanar CT image reconstructions and MIPs were obtained to evaluate the vascular anatomy. CONTRAST:  183mL OMNIPAQUE IOHEXOL 350 MG/ML SOLN COMPARISON:  Chest x-ray from earlier same day and chest CT dated 12/15/2015. FINDINGS: Anterior mediastinal mass measuring 5.6 x 3.4 cm, stable compared to the most recent chest CT. By report, this measured 4.6 x 2.7 cm on 09/22/2015. Lower right paratracheal lymph node is stable measuring 1.9 cm short axis dimension. Additional nodular mass within the aorta pulmonary window region is stable at 1.4 cm short axis dimension. No new masses or enlarged lymph nodes within the mediastinum or perihilar regions. The majority of the peripheral segmental and subsegmental pulmonary arteries cannot be definitively characterized due to patient breathing motion artifact. There is no central obstructing pulmonary embolism identified within the main, lobar or central segmental pulmonary arteries. Thoracic aorta is normal in caliber and configuration. No aortic aneurysm or dissection. Heart size is upper normal. No pericardial effusion. Lungs are clear. No evidence of pneumonia. No pleural effusion. No pneumothorax. Trachea and central bronchi are unremarkable. Limited images of the upper abdomen are unremarkable. Left breast mass is stable in appearance, measuring approximately 2.8 cm greatest dimension, compatible with patient's history of breast cancer and/or postsurgical change. No acute osseous  abnormality appreciated. Degenerative changes again noted within the thoracic spine. Irregular sclerosis and lucency within the sternum is stable and may be related to post treatment change. Review of the MIP images confirms the above findings. IMPRESSION: 1. Stable exam. The mediastinal masses and lymphadenopathy are stable compared to the most recent chest CT of 12/15/2015, although progression was noted in the last CT report when compared to an earlier CT of 09/22/2015. 2. No acute findings. No pulmonary embolism seen, with study limitations detailed above. Lungs are clear. Electronically Signed  By: Franki Cabot M.D.   On: 12/31/2015 22:00   Mr Jeri Cos [id number] Contrast  12/24/2015  CLINICAL DATA:  Altered mental status. Noncommunicating. History of breast cancer, assess for metastasis. EXAM: MRI HEAD WITHOUT AND WITH CONTRAST TECHNIQUE: Multiplanar, multiecho pulse sequences of the brain and surrounding structures were obtained without and with intravenous contrast. CONTRAST:  88mL MULTIHANCE GADOBENATE DIMEGLUMINE 529 MG/ML IV SOLN COMPARISON:  CT neck December 23, 2015 FINDINGS: At least 20 infratentorial and greater than 20 supratentorial enhancing parenchymal masses, including 12 mm mass prior pontomedullary junction, multiple subcentimeter masses in the bilateral thalamus, bilateral basal ganglia and, at gray-white matter junction, extending into the deep white matter. 1 cm mass in LEFT anterior cranial fossa and may be extra-axial, sagittal 19/24. Largest supratentorial mass in LEFT frontal lobe measuring up to 18 mm. 11 mm mass RIGHT parasagittal splenium of corpus callosum. Masses demonstrate reduced diffusion, in T2 shine through. No susceptibility artifact to suggest hemorrhage. Masses demonstrate bright FLAIR T2 hyperintense vasogenic edema without significant mass effect. No midline shift. No reduced diffusion to suggest acute ischemia. Ventricles and sulci are generally normal for patient's age. No  abnormal extra-axial fluid collections. No extra-axial masses nor leptomeningeal enhancement. Normal major intracranial vascular flow voids seen at the skull base. Ocular globes and orbital contents are unremarkable though not tailored for evaluation. No suspicious calvarial bone marrow signal. No abnormal sellar expansion. Craniocervical junction maintained. Visualized paranasal sinuses and mastoid air cells are well-aerated. IMPRESSION: At least 20 infratentorial and greater than 20 relatively small supratentorial intraparenchymal metastasis. Local mass effect without midline shift. 1 cm LEFT anterior cranial fossa possible dural based metastasis. Electronically Signed   By: Elon Alas M.D.   On: 12/24/2015 22:27   Ct Abdomen Pelvis W Contrast  12/15/2015  CLINICAL DATA:  70 year old female with history of left-sided breast cancer diagnosed in 2011 with none metastatic disease. Ongoing chemotherapy. Difficulty swallowing for the past 2 weeks. EXAM: CT CHEST, ABDOMEN, AND PELVIS WITH CONTRAST TECHNIQUE: Multidetector CT imaging of the chest, abdomen and pelvis was performed following the standard protocol during bolus administration of intravenous contrast. CONTRAST:  62mL OMNIPAQUE IOHEXOL 300 MG/ML SOLN, 132mL OMNIPAQUE IOHEXOL 300 MG/ML SOLN COMPARISON:  CT the chest, abdomen and pelvis 09/22/2015. PET-CT 07/18/2015. FINDINGS: CT CHEST FINDINGS Mediastinum/Lymph Nodes: Further increase in metastatic lymphadenopathy throughout the mediastinum, most evident in the anterior mediastinum where the largest enhancing nodal mass measures up to 3.5 x 6.1 cm (image 18 of series 2), previously only 2.7 x 4.6 cm on 09/22/2015. This lesion is contiguous with the left internal mammary nodal chain, which also demonstrates some enlarged lymph nodes. Marked enlargement of a low right paratracheal lymph node which currently measures 1.7 cm in short axis (previously nonenlarged). Heart size is borderline enlarged. There  is no significant pericardial fluid, thickening or pericardial calcification. There is atherosclerosis of the thoracic aorta, the great vessels of the mediastinum and the coronary arteries, including calcified atherosclerotic plaque in the left main, left anterior descending and right coronary arteries. Mild calcifications of the mitral annulus and aortic valve. Esophagus is unremarkable in appearance. No axillary lymphadenopathy. Right-sided internal jugular single-lumen porta cath with tip terminating at the superior cavoatrial junction. Lungs/Pleura: No suspicious appearing pulmonary nodules or masses. No acute consolidative airspace disease. No pleural effusions. Musculoskeletal/Soft Tissues: Soft tissue mass in the central aspect of the left breast measuring up to 2.9 x 2.2 cm (image 35 of series 2), similar to prior examination. Some overlying skin  thickening is noted. Irregular sclerosis and lucency is again noted in the manubrium, similar to the prior examination. There is also a small lucent lesion in the left side of the sternum measuring 7 mm (image 23 of series 7) which is similar to prior examinations. CT ABDOMEN AND PELVIS FINDINGS Hepatobiliary: No suspicious cystic or solid hepatic lesions. No intra or extrahepatic biliary ductal dilatation. Gallbladder is normal in appearance. Pancreas: No pancreatic mass. No pancreatic ductal dilatation. No pancreatic or peripancreatic fluid or inflammatory changes. Spleen: Unremarkable. Adrenals/Urinary Tract: Several sub cm low-attenuation lesions are noted in the kidneys bilaterally, similar to prior examinations. Although these are too small to definitively characterize, these are presumably small cysts. Bilateral adrenal glands are normal in appearance. No hydroureteronephrosis. Urinary bladder is nearly decompressed, but otherwise unremarkable in appearance. Stomach/Bowel: Normal appearance of the stomach. No pathologic dilatation of small bowel or colon.  Normal appendix. Vascular/Lymphatic: Atherosclerosis throughout the abdominal and pelvic vasculature, without evidence of aneurysm or dissection. No lymphadenopathy noted in the abdomen or pelvis. Reproductive: Uterus and ovaries are atrophic. Other: No significant volume of ascites.  No pneumoperitoneum. Musculoskeletal: New osteolysis involving the superior aspect of the right ilium with some extension of tumor into the overlying right gluteal and iliacus musculature. 12 mm of anterolisthesis of L5 upon S1. Bilateral pars defects at L5. IMPRESSION: 1. Today's study demonstrates progression of metastatic disease, with interval increase in mediastinal lymphadenopathy, and new development of a large lytic lesion in the superior aspect of the right ilium. 2. 2.9 x 2.2 cm mass in the central aspect of the left breast is similar to prior examinations. 3. Irregularities in the manubrium and sternum, similar to prior examinations, potentially either treatment related or treated metastatic disease. 4. Additional incidental findings, similar to prior examinations, as above. Electronically Signed   By: Vinnie Langton M.D.   On: 12/15/2015 10:37   Dg Esophagus  12/15/2015  CLINICAL DATA:  Dysphagia with solids. History of breast cancer status post lumpectomy and radiation. EXAM: ESOPHOGRAM / BARIUM SWALLOW / BARIUM TABLET STUDY TECHNIQUE: Combined double contrast and single contrast examination performed using effervescent crystals, thick barium liquid, and thin barium liquid. The patient was observed with fluoroscopy swallowing a 13 mm barium sulphate tablet. FLUOROSCOPY TIME:  Radiation Exposure Index (as provided by the fluoroscopic device): 24.62 d Gy cm2 COMPARISON:  None. FINDINGS: Normal esophageal peristalsis. No fixed esophageal narrowing or stricture. A 13 mm barium tablet passed into the stomach without delay. Small hiatal hernia. Patient could not tolerate prone swallows. Gastroesophageal reflux could not be  assessed. IMPRESSION: No fixed esophageal narrowing or stricture. Small hiatal hernia. Electronically Signed   By: Julian Hy M.D.   On: 12/15/2015 12:33   Dg Chest Port 1 View  12/29/2015  CLINICAL DATA:  Increased weakness, worsened breathing, increased sputum. History of left breast cancer with brain metastases. EXAM: PORTABLE CHEST 1 VIEW COMPARISON:  Chest x-ray dated 02/21/2015. Comparison also made to chest CT of 12/15/2015. FINDINGS: Cardiomediastinal silhouette appears stable in size and configuration. Some fullness at the left hilum is likely related to the metastatic lymphadenopathy identified within the mediastinum on earlier chest CT. Right chest wall Port-A-Cath now in place with tip well-positioned at the level of the lower SVC. Lungs are clear. No pleural effusion. No pneumothorax. Osseous structures about the chest are unremarkable. IMPRESSION: Fullness in the left hilum likely related to the metastatic lymphadenopathy identified within the mediastinum on earlier chest CT. Lungs are clear and there is no  evidence of acute cardiopulmonary abnormality. No evidence of pneumonia. Electronically Signed   By: Franki Cabot M.D.   On: 01/02/2016 18:45         Subjective: Patient is lethargic, but awakens on examination. Denies any pain, shortness of breath, vomiting, diarrhea. No respiratory distress noted. Otherwise review of systems unobtainable secondary to encephalopathy  Objective: Filed Vitals:   01/06/16 0831 01/06/16 1035 01/06/16 2032 01/07/16 0535  BP:  111/70 96/74 126/72  Pulse:  77 79 78  Temp:  97.6 F (36.4 C) 98 F (36.7 C) 97.5 F (36.4 C)  TempSrc:  Axillary Axillary Axillary  Resp: 18 18 18 25   Height:      Weight:      SpO2: 98% 99% 99%    No intake or output data in the 24 hours ending 01/07/16 1308 Weight change:  Exam:   General:  Pt is alert, follows commands appropriately, not in acute distress  HEENT: No icterus, No thrush, No neck mass,  E. Lopez/AT  Cardiovascular: RRR, S1/S2, no rubs, no gallops  Respiratory: Bibasilar rhonchi, left greater than right. No wheezing  Abdomen: Soft/+BS, non tender, non distended, no guarding no hepatosplenomegaly;   Extremities: No edema, No lymphangitis, No petechiae, No rashes, no synovitis; no cyanosis   Data Reviewed: Basic Metabolic Panel:  Recent Labs Lab 01/06/2016 1810 01/06/16 0228 01/07/16 0500  NA 135 138 139  K 4.2 3.9 4.1  CL 103 104 107  CO2 24 25 23   GLUCOSE 176* 135* 147*  BUN 24* 18 21*  CREATININE 0.44 0.37* 0.37*  CALCIUM 8.0* 7.7* 7.1*   Liver Function Tests:  Recent Labs Lab 12/25/2015 1810 01/06/16 0228  AST 19 17  ALT 20 19  ALKPHOS 88 80  BILITOT 0.8 0.8  PROT 7.4 6.8  ALBUMIN 3.8 3.6   No results for input(s): LIPASE, AMYLASE in the last 168 hours.  Recent Labs Lab 01/08/2016 2159  AMMONIA 58*   CBC:  Recent Labs Lab 01/19/2016 1810 01/06/16 0228 01/07/16 0500  WBC 2.5* 1.7* 1.8*  NEUTROABS 2.2 1.2*  --   HGB 12.3 11.8* 11.0*  HCT 35.6* 35.8* 32.3*  MCV 82.6 86.7 83.2  PLT 129* 112* 112*   Cardiac Enzymes: No results for input(s): CKTOTAL, CKMB, CKMBINDEX, TROPONINI in the last 168 hours. BNP: Invalid input(s): POCBNP CBG:  Recent Labs Lab 01/06/16 0757  GLUCAP 91    Recent Results (from the past 240 hour(s))  Blood Culture (routine x 2)     Status: None (Preliminary result)   Collection Time: 01/03/2016  6:00 PM  Result Value Ref Range Status   Specimen Description BLOOD RIGHT ARM  Final   Special Requests BOTTLES DRAWN AEROBIC AND ANAEROBIC 5CC  Final   Culture   Final    NO GROWTH 2 DAYS Performed at [facility]    Report Status PENDING  Incomplete  Blood Culture (routine x 2)     Status: None (Preliminary result)   Collection Time: 01/03/2016  6:00 PM  Result Value Ref Range Status   Specimen Description BLOOD LEFT ARM  Final   Special Requests BOTTLES DRAWN AEROBIC AND ANAEROBIC 5CC  Final   Culture   Final     NO GROWTH 2 DAYS Performed at [facility]    Report Status PENDING  Incomplete  Urine culture     Status: None   Collection Time: 12/29/2015 10:38 PM  Result Value Ref Range Status   Specimen Description URINE, CLEAN CATCH  Final   Special Requests NONE  Final   Culture   Final    MULTIPLE SPECIES PRESENT, SUGGEST RECOLLECTION Performed at [facility]    Report Status 01/07/2016 FINAL  Final  MRSA PCR Screening     Status: None   Collection Time: 01/06/16  2:38 AM  Result Value Ref Range Status   MRSA by PCR NEGATIVE NEGATIVE Final    Comment:        The GeneXpert MRSA Assay (FDA approved for NASAL specimens only), is one component of a comprehensive MRSA colonization surveillance program. It is not intended to diagnose MRSA infection nor to guide or monitor treatment for MRSA infections.      Scheduled Meds: . acetylcysteine  4 mL Nebulization Once  . atropine  2 drop Sublingual QID  . dexamethasone  4 mg Oral TID  . doxycycline  100 mg Oral Q12H  . enoxaparin (LOVENOX) injection  40 mg Subcutaneous Q24H  . sodium chloride flush  3 mL Intravenous Q12H   Continuous Infusions:    Xzaviar Maloof, DO  [facility] Pager [phone]  If 7PM-7AM, please contact night-coverage [email or web address] Password [email or web address] 01/07/2016, 1:08 PM   LOS: 2 days

## 2016-01-07 NOTE — Progress Notes (Signed)
Daily Progress Note   Patient Name: Michele Hale       Date: 01/07/2016 DOB: Oct 27, 1945  Age: 70 y.o. MRN#: [id number] Attending Physician: Orson Eva, MD Primary Care Physician: Angelica Chessman, MD Admit Date: 01/11/2016  Reason for Consultation/Follow-up: Establishing goals of care and Psychosocial/spiritual support  Subjective:  Continued conversation at  patient's bedside along with her two sons and SW with [facility] to discuss diagnosis, prognosis, GOC, EOL wishes disposition and options.   Another detailed discussion was had today regarding advanced directives.  Concepts specific to code status, artifical feeding and hydration, continued IV antibiotics  was had.  The difference between a aggressive medical intervention path  and a palliative comfort care path for this patient at this time was had.  Values and goals of care important to patient and family were attempted to be elicited.  Family is open to all offered and available  medical interventions to prolong life.  Treat the treatable.  Family desires continued hydration and would also be open to artifical feeding if offered.  It is uncomprehensible to family that you would limit any intervention that might prolong life.  Family does understand the limited prognosis, comfort is a priority,and  they continue to verbalize a desire for all life prolonging measures.    Natural trajectory and expectations at EOL were discussed.  Questions and concerns addressed.   PMT will continue to support holistically.   Length of Stay: 2 days  Current Medications: Scheduled Meds:  . acetylcysteine  4 mL Nebulization Once  . ampicillin-sulbactam (UNASYN) IV  3 g Intravenous Q8H  . atropine  2 drop Sublingual QID  . dexamethasone  4 mg Oral  TID  . enoxaparin (LOVENOX) injection  40 mg Subcutaneous Q24H  . sodium chloride flush  3 mL Intravenous Q12H    Continuous Infusions:    PRN Meds: acetaminophen **OR** acetaminophen, bisacodyl, HYDROcodone-acetaminophen, LORazepam, morphine CONCENTRATE, ondansetron **OR** ondansetron (ZOFRAN) IV, polyethylene glycol  Physical Exam: Physical Exam  Constitutional: She appears lethargic. She appears cachectic. She appears ill.  Cardiovascular: Tachycardia present.   Pulmonary/Chest: She has decreased breath sounds in the right lower field and the left lower field.  Neurological: She appears lethargic.  Skin: Skin is warm and dry.  Vital Signs: BP 112/63 mmHg  Pulse 77  Temp(Src) 97.7 F (36.5 C) (Axillary)  Resp 22  Ht 5\' 4"  (1.626 m)  Wt 60.7 kg (133 lb 13.1 oz)  BMI 22.96 kg/m2  SpO2 99% SpO2: SpO2: 99 % O2 Device: O2 Device: Not Delivered O2 Flow Rate: O2 Flow Rate (L/min): 2 L/min  Intake/output summary: No intake or output data in the 24 hours ending 01/07/16 1614 LBM:   Baseline Weight: Weight: 63.504 kg (140 lb) Most recent weight: Weight: 60.7 kg (133 lb 13.1 oz)       Palliative Assessment/Data: Flowsheet Rows        Most Recent Value   Intake Tab    Referral Department  Hospitalist   Unit at Time of Referral  ICU   Palliative Care Primary Diagnosis  Cancer   Date Notified  01/20/2016   Palliative Care Type  New Palliative care   Reason for referral  Clarify Goals of Care, End of Life Care Assistance   Date of Admission  01/09/2016   Date first seen by Palliative Care  01/06/16   # of days Palliative referral response time  1 Day(s)   # of days IP prior to Palliative referral  0   Clinical Assessment    Psychosocial & Spiritual Assessment    Palliative Care Outcomes       Additional Data Reviewed: CBC    Component Value Date/Time   WBC 1.8* 01/07/2016 0500   WBC 5.3 12/17/2015 0905   RBC 3.88 01/07/2016 0500   RBC 4.17 12/17/2015  0905   HGB 11.0* 01/07/2016 0500   HGB 11.6 12/17/2015 0905   HCT 32.3* 01/07/2016 0500   HCT 36.2 12/17/2015 0905   PLT 112* 01/07/2016 0500   PLT 190 12/17/2015 0905   MCV 83.2 01/07/2016 0500   MCV 86.8 12/17/2015 0905   MCH 28.4 01/07/2016 0500   MCH 27.8 12/17/2015 0905   MCHC 34.1 01/07/2016 0500   MCHC 32.0 12/17/2015 0905   RDW 15.2 01/07/2016 0500   RDW 16.2* 12/17/2015 0905   LYMPHSABS 0.5* 01/06/2016 0228   LYMPHSABS 0.9 12/17/2015 0905   MONOABS 0.1 01/06/2016 0228   MONOABS 0.3 12/17/2015 0905   EOSABS 0.0 01/06/2016 0228   EOSABS 0.0 12/17/2015 0905   BASOSABS 0.0 01/06/2016 0228   BASOSABS 0.0 12/17/2015 0905    CMP     Component Value Date/Time   NA 139 01/07/2016 0500   NA 141 12/17/2015 0905   K 4.1 01/07/2016 0500   K 4.5 12/17/2015 0905   CL 107 01/07/2016 0500   CL 104 01/23/2013 1420   CO2 23 01/07/2016 0500   CO2 28 12/17/2015 0905   GLUCOSE 147* 01/07/2016 0500   GLUCOSE 106 12/17/2015 0905   GLUCOSE 150* 01/23/2013 1420   BUN 21* 01/07/2016 0500   BUN 9.3 12/17/2015 0905   CREATININE 0.37* 01/07/2016 0500   CREATININE 0.7 12/17/2015 0905   CALCIUM 7.1* 01/07/2016 0500   CALCIUM 9.7 12/17/2015 0905   PROT 6.8 01/06/2016 0228   PROT 7.5 12/17/2015 0905   ALBUMIN 3.6 01/06/2016 0228   ALBUMIN 3.5 12/17/2015 0905   AST 17 01/06/2016 0228   AST 14 12/17/2015 0905   ALT 19 01/06/2016 0228   ALT <9 12/17/2015 0905   ALKPHOS 80 01/06/2016 0228   ALKPHOS 104 12/17/2015 0905   BILITOT 0.8 01/06/2016 0228   BILITOT 0.31 12/17/2015 0905   GFRNONAA >60 01/07/2016 0500   GFRAA >60 01/07/2016 0500  Problem List:  Patient Active Problem List   Diagnosis Date Noted  . Aspiration pneumonia ([place]) 01/07/2016  . DNR (do not resuscitate) discussion   . Palliative care encounter   . Respiratory failure ([place])   . Acute encephalopathy   . Cancer of left breast metastatic to brain ([place]) 01/04/2016  . Acute respiratory failure with hypoxia  ([place]) 01/22/2016  . Malignant neoplasm metastatic to right lung ([place]) 01/02/2016  . Brain metastasis ([place]) 12/25/2015  . Dysphagia 11/26/2015  . Conjunctivitis 10/15/2015  . Tonsillitis 10/07/2015  . Lung metastases ([place]) 09/24/2015  . Chemotherapy-induced neuropathy ([place]) 09/24/2015  . Antineoplastic chemotherapy induced pancytopenia ([place]) 09/24/2015  . Liver metastases ([place]) 09/24/2015  . Mediastinal mass   . Nausea alone 05/20/2014  . Dysuria 05/20/2014  . Breast cancer of lower-outer quadrant of left female breast ([place]) 05/20/2014  . Estrogen deficiency 05/20/2014  . Right leg pain 01/02/2014  . Osteoporosis 04/11/2013  . Neck pain on left side 04/10/2013  . Allergic rhinitis 02/02/2013  . Osteoarthritis 02/02/2013  . Knee pain 02/02/2013  . Sciatica neuralgia 02/02/2013  . Lumbar radicular pain 02/02/2013     Palliative Care Assessment & Plan    1.Code Status:   Full code-intense conversation and education regarding code status- strongly recommended DNR status knowing outcomes in similar patients, however family  request Full Code     Code Status Orders        Start     Ordered   01/01/2016 2325  Full code   Continuous     01/20/2016 2327    Code Status History    Date Active Date Inactive Code Status Order ID Comments User Context   03/17/2015 10:23 AM 03/18/2015  3:28 AM Full Code [id number]  Arne Cleveland, MD [place]   03/04/2015 11:37 AM 03/05/2015  3:47 AM Full Code [id number]  Arne Cleveland, MD [facility]       2. Goals of Care/Additional Recommendations:  Decision made to stop radiation  3. Symptom Management:       1.  Pain/Dyspnea: Roxanol 5 mg po/sl every 2 hrs prn  4. Palliative Prophylaxis:   Aspiration, Bowel Regimen, Delirium Protocol, Frequent Pain Assessment, Oral Care and Turn Reposition  5. Prognosis: < 2 weeks  6. Discharge Planning:   Pending   Care plan was discussed with Dr Tat    Thank you for allowing the Palliative Medicine Team to  assist in the care of this patient.   Time In:  1200 Time Out: 1255  Total Time  55  Prolonged Time Billed  no         Knox Royalty, NP  01/07/2016, 4:14 PM  Please contact Palliative Medicine Team phone at [phone] for questions and concerns.

## 2016-01-07 NOTE — Progress Notes (Signed)
[facility] 1430-Hospice and Palliative Care of -[facility]-GIP RN Visit  This is a GIP related, covered admission from 01/06/16 to her [facility] diagnosis of Breast Cancer. This patient was admitted to hospice services 01/04/16. Patient is a FULL CODE. Patient seen in room, sleeping soundly. She is snoring loudly. She did not arouse to verbal stimuli.  She is now on 2L [place] and her respirations are WNL. Patient is receiving Decadron 4 mg po TID and Doxycycline 100 mg po BID. She received 3 doses of 5 mg Roxanol po for severe pain and 1 mg Ativan po for anxiety the past 24 hours. Patient received a radiation treatment yesterday, per family request. She is now coughing up milky sputum, per her daughter-in-law. Son reported he attempted to feed the patient this morning, and she had difficulty eating/swallowing.  He is concerned about her nutrition. Discussed with the son, that as her disease progresses, her appetite may decrease.  He verbalized understanding.   Discussed if there are any additional DME needs if the patient is discharged home.  Son stated that all the necessary DME is already in the home, including oxygen, nebulizer, and hospital bed.  [facility] will continue to follow daily and anticipate any discharge needs.  Please call with any hospice-related questions or concerns.  Thank you, Freddi Starr RN, [facility] [place] Liaison [phone]

## 2016-01-07 NOTE — Progress Notes (Signed)
Initial Nutrition Assessment  DOCUMENTATION CODES:   Severe malnutrition in context of chronic illness  INTERVENTION:  -RD to continue to monitor for needs NUTRITION DIAGNOSIS:   Malnutrition related to acute illness as evidenced by energy intake < 75% for > or equal to 1 month, energy intake < 75% for > 7 days.  GOAL:   Patient will meet greater than or equal to 90% of their needs  MONITOR:   PO intake, I & O's, Labs, Weight trends  REASON FOR ASSESSMENT:   Malnutrition Screening Tool    ASSESSMENT:   Michele Hale is an unfortunate 70 y.o. female with PMH of left breast cancer, widely metastatic, now presenting to the emergency department with respiratory distress and depressed level of consciousness  Spoke with Pt's daughter at bedside. Pt has been in a depressed state of consciousness for approximately 1-2 weeks now. She is a total feed assist. Per family, she hasn't eaten much in same time span.   Per daughter, pt's son tried to feed her earlier this week and patient was having trouble chewing/swallowing. Stated this was new. Pt is receiving home-hospice services, but family desires full treatment at this point. They endorse weight loss, but are unable to tell me an amount, or time-span in which she lost weight. Chart review indicates an 11#/7.6% severe wt loss in 2 weeks. Declined NFPE r/t religious reasons.  With evidence of poor PO intake and weight loss, pt is suffering from severe malnutrition.  Provided emotional support; no other interventions warranted from RD at this point. SLP eval is likely inappropriate, as would be any nutrition support interventions. Follow for GOC > family wishes.  Diet Order:  Diet regular Room service appropriate?: Yes; Fluid consistency:: Thin  Skin:  Reviewed, no issues  Last BM:  PTA  Height:   Ht Readings from Last 1 Encounters:  01/06/16 5\' 4"  (1.626 m)    Weight:   Wt Readings from Last 1 Encounters:  01/06/16 133  lb 13.1 oz (60.7 kg)    Ideal Body Weight:     BMI:  Body mass index is 22.96 kg/(m^2).  Estimated Nutritional Needs:   Kcal:  1800-2100 calories  Protein:  70-90 grams  Fluid:  >/= 1.8L  EDUCATION NEEDS:   No education needs identified at this time  Satira Anis. Taylan Marez, MS, RD LDN After Hours/Weekend Pager [phone]

## 2016-01-08 ENCOUNTER — Inpatient Hospital Stay (HOSPITAL_COMMUNITY)
Admit: 2016-01-08 | Discharge: 2016-01-08 | Disposition: A | Discharge status: Home / Self Care | Attending: Internal Medicine | Admitting: Internal Medicine

## 2016-01-08 ENCOUNTER — Inpatient Hospital Stay (HOSPITAL_COMMUNITY): Admitting: Certified Registered Nurse Anesthetist

## 2016-01-08 ENCOUNTER — Inpatient Hospital Stay (HOSPITAL_COMMUNITY)

## 2016-01-08 ENCOUNTER — Ambulatory Visit: Payer: Medicaid Other

## 2016-01-08 DIAGNOSIS — G9340 Encephalopathy, unspecified: Secondary | ICD-10-CM

## 2016-01-08 LAB — GLUCOSE, CAPILLARY
GLUCOSE-CAPILLARY: 128 mg/dL — AB (ref 65–99)
GLUCOSE-CAPILLARY: 203 mg/dL — AB (ref 65–99)
Glucose-Capillary: 206 mg/dL — ABNORMAL HIGH (ref 65–99)

## 2016-01-08 LAB — BLOOD GAS, ARTERIAL
ACID-BASE DEFICIT: 1.7 mmol/L (ref 0.0–2.0)
Acid-Base Excess: 1.8 mmol/L (ref 0.0–2.0)
Bicarbonate: 22.9 mEq/L (ref 20.0–24.0)
Bicarbonate: 24.1 mEq/L — ABNORMAL HIGH (ref 20.0–24.0)
DRAWN BY: 345601
DRAWN BY: 295031
Drawn by: 345601
FIO2: 1
FIO2: 1
MECHVT: 460 mL
O2 CONTENT: 4 L/min
O2 Content: 15 L/min
O2 SAT: 96.5 %
O2 SAT: 97.3 %
O2 Saturation: 98 %
PATIENT TEMPERATURE: 37
PATIENT TEMPERATURE: 37
PEEP: 5 cmH2O
PH ART: 7.366 (ref 7.350–7.450)
PH ART: 6.97 — AB (ref 7.350–7.450)
PO2 ART: 167 mmHg — AB (ref 80.0–100.0)
PO2 ART: 384 mmHg — AB (ref 80.0–100.0)
Patient temperature: 37
RATE: 20 resp/min
TCO2: 21.2 mmol/L (ref 0–100)
TCO2: 21.7 mmol/L (ref 0–100)
pCO2 arterial: 31.1 mmHg — ABNORMAL LOW (ref 35.0–45.0)
pCO2 arterial: 41 mmHg (ref 35.0–45.0)
pH, Arterial: 7.502 — ABNORMAL HIGH (ref 7.350–7.450)
pO2, Arterial: 91.8 mmHg (ref 80.0–100.0)

## 2016-01-08 LAB — CBC
HEMATOCRIT: 35.5 % — AB (ref 36.0–46.0)
HEMOGLOBIN: 11.4 g/dL — AB (ref 12.0–15.0)
MCH: 28.3 pg (ref 26.0–34.0)
MCHC: 32.1 g/dL (ref 30.0–36.0)
MCV: 88.1 fL (ref 78.0–100.0)
Platelets: 110 10*3/uL — ABNORMAL LOW (ref 150–400)
RBC: 4.03 MIL/uL (ref 3.87–5.11)
RDW: 15.4 % (ref 11.5–15.5)
WBC: 2.5 10*3/uL — ABNORMAL LOW (ref 4.0–10.5)

## 2016-01-08 LAB — BASIC METABOLIC PANEL
ANION GAP: 5 (ref 5–15)
BUN: 19 mg/dL (ref 6–20)
CALCIUM: 6.8 mg/dL — AB (ref 8.9–10.3)
CHLORIDE: 109 mmol/L (ref 101–111)
CO2: 24 mmol/L (ref 22–32)
Creatinine, Ser: 0.37 mg/dL — ABNORMAL LOW (ref 0.44–1.00)
GFR calc non Af Amer: >60 mL/min (ref >60)
Glucose, Bld: 108 mg/dL — ABNORMAL HIGH (ref 65–99)
Potassium: 4.3 mmol/L (ref 3.5–5.1)
Sodium: 138 mmol/L (ref 135–145)

## 2016-01-08 LAB — VITAMIN B12: Vitamin B-12: 1039 pg/mL — ABNORMAL HIGH (ref 180–914)

## 2016-01-08 MED ORDER — FENTANYL CITRATE (PF) 100 MCG/2ML IJ SOLN
50.0000 ug | INTRAMUSCULAR | Status: DC | PRN
  Administered 2016-01-08 – 2016-01-09 (×7): 50 ug via INTRAVENOUS
  Filled 2016-01-08 (×7): qty 2

## 2016-01-08 MED ORDER — SODIUM CHLORIDE 0.9 % IV BOLUS (SEPSIS)
500.0000 mL | Freq: Once | INTRAVENOUS | Status: AC
  Administered 2016-01-08: 500 mL via INTRAVENOUS

## 2016-01-08 MED ORDER — VITAL HIGH PROTEIN PO LIQD
1000.0000 mL | ORAL | Status: DC
  Filled 2016-01-08: qty 1000

## 2016-01-08 MED ORDER — DEXTROSE-NACL 5-0.45 % IV SOLN
INTRAVENOUS | Status: DC
  Administered 2016-01-08: 11:00:00 via INTRAVENOUS

## 2016-01-08 MED ORDER — PANTOPRAZOLE SODIUM 40 MG PO PACK
40.0000 mg | PACK | Freq: Every day | ORAL | Status: DC
  Administered 2016-01-08 – 2016-01-09 (×2): 40 mg
  Filled 2016-01-08 (×4): qty 20

## 2016-01-08 MED ORDER — CHLORHEXIDINE GLUCONATE 0.12% ORAL RINSE (MEDLINE KIT)
15.0000 mL | Freq: Two times a day (BID) | OROMUCOSAL | Status: DC
  Administered 2016-01-08 – 2016-01-09 (×3): 15 mL via OROMUCOSAL

## 2016-01-08 MED ORDER — VITAL AF 1.2 CAL PO LIQD
1000.0000 mL | ORAL | Status: DC
  Administered 2016-01-08: 1000 mL
  Filled 2016-01-08 (×3): qty 1000

## 2016-01-08 MED ORDER — PRO-STAT SUGAR FREE PO LIQD
30.0000 mL | Freq: Two times a day (BID) | ORAL | Status: DC
  Administered 2016-01-08 – 2016-01-09 (×3): 30 mL
  Filled 2016-01-08 (×3): qty 30

## 2016-01-08 MED ORDER — PROPOFOL 10 MG/ML IV BOLUS
INTRAVENOUS | Status: DC | PRN
  Administered 2016-01-08: 50 mg via INTRAVENOUS

## 2016-01-08 MED ORDER — ANTISEPTIC ORAL RINSE SOLUTION (CORINZ)
7.0000 mL | Freq: Four times a day (QID) | OROMUCOSAL | Status: DC
  Administered 2016-01-08 – 2016-01-09 (×4): 7 mL via OROMUCOSAL

## 2016-01-08 MED ORDER — IPRATROPIUM-ALBUTEROL 0.5-2.5 (3) MG/3ML IN SOLN
3.0000 mL | Freq: Four times a day (QID) | RESPIRATORY_TRACT | Status: DC
  Administered 2016-01-08 – 2016-01-09 (×4): 3 mL via RESPIRATORY_TRACT
  Filled 2016-01-08 (×4): qty 3

## 2016-01-08 MED ORDER — PROPOFOL 10 MG/ML IV BOLUS
INTRAVENOUS | Status: AC
  Filled 2016-01-08: qty 20

## 2016-01-08 NOTE — Significant Event (Signed)
Rapid Response Event Note  Overview: Time Called: 0030 Arrival Time: 0040 Event Type: Respiratory, Other (Comment)  Initial Focused Assessment: Pt laying in bed, semi-unresponsive, with snoring respirations, eyes open to voice, but don't appear to track, gross bilat upper extremity movement noted with stimulation, family at bedside, vital signs taken all WNL, O2 sats 99%, attempted to reposition pt's head to see if snoring changed, slight decrease in sound noted but minimal.  Respiratory Therapy called for ABG, reported ABG results are: pH-7.36, pCO2-41, pO2-91, Bicarb-22.9, with saturation of 96%.  Also CBG obtained - 128.  Pt does have Hx of Cancer with Brain mets and pt's situation is previously known to me from her brief stay in ICU/SD unit on this admission.  Per chart documentation Morphine 5mg  oral solution given at 2118 and Ativan 1mg  given at 2207.  I advised bedside RN to page the [facility] NP on for tonight and discuss situation with her due to out of possible interventions to recommend.  Previous discussions have been made with family regarding Code Status, family wants pt's to remain full code, due to religious/cultural beliefs.   Interventions: ABG, CBG  Event Summary: Bedside RN contacted [facility] NP Chaney Malling and advised of situation. Pt transfer not ordered at present time. If change in condition, please call Rapid Response Phone at [phone].   Nance Pew ICU/SD Unit Care Coordinator / Rapid Response RN

## 2016-01-08 NOTE — Progress Notes (Signed)
[facility] 1235-Hospice and Palliative Care of [place]-[facility]-GIP RN Visit  This is a GIP related, covered admission from 01/06/16 to her [facility] diagnosis of Breast Cancer. This patient was admitted to hospice services 01/04/16. Patient is a FULL CODE.  [facility] was had multiple conversations regarding the progression of the patient's disease process and symptoms that have indicated the patient is approaching EOL, however the family has felt strongly to keep her a Full Code and accept all treatments offered and available. Patient seen in room, unresponsive to verbal or tactile stimuli.  She is currently hypotensive with a BP of 76/56. She is on full ventilator support via an ETT with a FiO2 of 100%. She is unresponsive without receiving any sedating medications for comfort of the ETT. Staff RN currently administering a 500 ml bolus for treatment of the hypotension. Patient was intubated this morning after experiencing respiratory distress, and was transferred to ICU. Spoke to [employer], regarding his mother's critical status and now life support. He stated that an EEG is to be performed today and he wants to know those results before making any decisions. He was concerned about the status of his sister being able to come to the [place].  I confirmed with him that yesterday a letter was sent to the [place] Consulate requesting assistance to get his sister here from [place], due to their mother's grave condition.  He acknowledged that this was done and did not wish to speak further about his mother's condition.  Updated [facility] social worker, Marilynne Halsted, who will be visiting the family this afternoon to provide emotional support.  [facility] will continue to follow.   Please call with any hospice-related questions or concerns.  Thank you, Freddi Starr RN, [employer] Liaison [phone]

## 2016-01-08 NOTE — Progress Notes (Signed)
[facility] MSW note: This is a hospice related admission. Pt remains a full code as requested by son-Alhafivhafan. Pt was transferred to stepdown ICU this morning as she was intubated. Pt was beginning her EEG upon arrival. MSW met with 2 sons-Alhafivhafan and Bud outside pt's room. Son-Alhafivhafan  was able to state that pt was "not doing good". Son discussed that pt is different from yesterday. Son aware of pt's poor prognosis. Son explained that he wants to wait on the the results of the EEG before making decision about pt. Son explained that he still wants to treat what can be treated. Son was somewhat guarded on visit. MSW provided therapy to validate son's feelings. Son was able to state that he felt pt was comfortable today. MSW provided education on natural signs of disease process. MSW educated son on [facility] services as he has 2 children in the home. No [facility] referral at this time. MSW explained to son that [facility] and the hospital desire to make sure pt is comfortable and do what is best for pt by honoring family wishes. MSW reassured son that letter to the [place] Consulate in [place] was emailed yesterday at his request to assist pt's daughter to obtain travel visa. MSW discussed case with Lanetta Inch, RN liaison; Dr. Cherie Ouch, [facility] MD;  and [phone] RN.   Marilynne Halsted, MSW

## 2016-01-08 NOTE — Progress Notes (Signed)
At 1100 pt bp was 76/56.  Informed Eric Form NP and 581mL bolus ordered as well as maintence fluid.  Irven [place], RN

## 2016-01-08 NOTE — Progress Notes (Signed)
PROGRESS NOTE  Michele Hale YYQ:[id number] DOB: [birth year] DOA: 01/12/2016 PCP: Angelica Chessman, MD  Brief History 70 y.o. female with PMH of left breast cancer, widely metastatic, now presenting to the emergency department with respiratory distress and depressed level of consciousness. Patient was diagnosed with breast cancer in 2012 and underwent neoadjuvant antiestrogen therapy followed by lumpectomy and radiation that year. She was diagnosed with metastatic disease in May 2016 and has known metastases to the liver, lung, and brain. She has been receiving palliative whole-brain radiation and home hospice services. Patient's condition has been rapidly declining. Her breathing has become more labored and she is less and less responsive. In ED, patient was found to be afebrile, requiring supplemental oxygen to maintain saturation in the 90s, and with vital signs otherwise stable. There are no acute findings on chest CT, with no suggestion of pneumonia, and no significant progression of metastatic lesions since the recent prior CT chest. Head CT is also negative for acute findings, but re-demonstrates the diffuse brain metastases.  Since admission, the pt continued to be somnolent.  Efforts to decrease patient's opioids were met with resistance from the family as they wanted patient's pain controlled.  The patient exhibited difficulty handling her secretions; therefore, the patient was started on Unasyn for concerns of aspiration pneumonitis.  Palliative medicine continued to meet with the family, but they continued to desire full scope of care despite the patient's very poor prognosis.  On the early morning of 01/08/16, the patient suffered respiratory arrest and was intubated and transferred to ICU. Assessment/Plan: Acute respiratory failure with hypoxia -Suspect the patient has underlying/ongoing aspiration secondary to her depressed mental status and hypoventilation from  opioids/hypnotics -Certainly, the patient's opioids and hypnotic medications may be contributing, but family resistant to discontinuing and adamant they did want pt to have pain or distress -01/07/16--started unasyn -01/08/16--respiratory arrest-->intubated  Acute encephalopathy -Multifactorial including known brain metastasis, possible steroids, opioid and hypnotic medications, and dehydration -not much change clinically during hospitalization  Metastatic breast cancer--bone, brain, lung -Appreciate Dr. Lindi Adie input--recommended hospice care -cancer has progressed on chemo -poor prognosis  Pancytopenia -may be due to marrow infiltration from metastatic cancer/chemo -checked B12, RBC folate -hep c antibody, hep B surface antigen  Goals of Care -remains FULL CODE despite numerous family meetings -poor prognosis -appreciate palliative medicine followup  -01/07/16--case discussed with palliative Jenene Slicker, NP   Family Communication: Son updated at beside--still wants full scope of care Disposition Plan: transferred to ICU Time spent--35 min, >50% spent counseling and coordinating care    Procedures/Studies: Dg Chest 1 View  01/08/2016  CLINICAL DATA:  Intubation EXAM: CHEST 1 VIEW COMPARISON:  01/08/2016 FINDINGS: Endotracheal tube is low at the left bronchus. Recommend withdrawal of 4 cm. Port-A-Cath tip in the SVC. Hypoventilation with mild bibasilar atelectasis. IMPRESSION: Endotracheal tube left main bronchus.  Recommend withdrawal 4 cm. These results were called by telephone at the time of interpretation on 01/08/2016 at 7:17 am to [facility], who verbally acknowledged these results. Electronically Signed   By: Franchot Gallo M.D.   On: 01/08/2016 07:17   Dg Chest 1 View  01/08/2016  CLINICAL DATA:  Short of breath.  Decreased oxygen saturation. EXAM: CHEST 1 VIEW COMPARISON:  01/18/2016 FINDINGS: Cardiac enlargement. Negative for heart failure. Negative for  pneumonia. Anterior mediastinal mass is best noted on recent CT scan. Port-A-Cath tip in the SVC. IMPRESSION: Cardiac enlargement. No superimposed acute cardiopulmonary abnormality and no change  from the prior study. Electronically Signed   By: Franchot Gallo M.D.   On: 01/08/2016 07:08   Ct Head Wo Contrast  01/04/2016  CLINICAL DATA:  Acute onset of altered mental status and apnea. Generalized weakness and confusion. Initial encounter. EXAM: CT HEAD WITHOUT CONTRAST TECHNIQUE: Contiguous axial images were obtained from the base of the skull through the vertex without intravenous contrast. COMPARISON:  MRI of the brain performed 12/24/2015, and CT of the head performed 02/21/2015 FINDINGS: Scattered areas of hypoattenuation reflect the patient's known diffuse brain metastases. These are better characterized on recent prior MRI. There is no evidence of hemorrhagic transformation. No acute infarct is seen. Scattered periventricular white matter change likely reflects small vessel ischemic microangiopathy. Mild cortical volume loss is noted. Mild cerebellar atrophy is seen. No midline shift is seen. There is no evidence of fracture; visualized osseous structures are unremarkable in appearance. The orbits are within normal limits. The paranasal sinuses and mastoid air cells are well-aerated. No significant soft tissue abnormalities are seen. IMPRESSION: 1. Scattered areas of hypoattenuation reflect the patient's known diffuse brain metastases. These are better characterized on recent prior MRI. 2. Scattered small vessel ischemic microangiopathy and mild cortical volume loss. Electronically Signed   By: Garald Balding M.D.   On: 01/04/2016 21:52   Ct Soft Tissue Neck W Contrast  12/15/2015  CLINICAL DATA:  Left metastatic breast cancer diagnosed in 2011. Ongoing chemotherapy. Difficulty swallowing for 2 weeks. EXAM: CT NECK WITH CONTRAST TECHNIQUE: Multidetector CT imaging of the neck was performed using the  standard protocol following the bolus administration of intravenous contrast. CONTRAST:  30m OMNIPAQUE IOHEXOL 300 MG/ML SOLN, 1079mOMNIPAQUE IOHEXOL 300 MG/ML SOLN COMPARISON:  PET-CT 02/28/2015 FINDINGS: Pharynx and larynx: Asymmetric larynx. The left arytenoid is medialized. No evidence of pharyngeal based mass lesion. Salivary glands: Negative Thyroid: Numerous small bilateral nodules, considered incidental. Lymph nodes: Enlarged nodes at diagnosis have significantly decreased in size. No enlarged necrotic appearing lymph nodes in the neck. No mass displacing the cervical esophagus. Vascular: Atherosclerosis without evidence of flow limiting stenosis. Limited intracranial: 6 mm focus of enhancement along the floor of the fourth ventricle, left of midline. No associated edema or spur thick ventricular effacement. Visualized orbits: Negative Mastoids and visualized paranasal sinuses: Clear Skeleton: No acute finding or aggressive lesion. Multilevel advanced facet arthropathy Upper chest: Reported separately These results will be called to the ordering clinician or representative by the Radiologist Assistant, and communication documented in the PACS or zVision Dashboard. IMPRESSION: 1. Questionable 6 mm mass in the left pons. Recommend brain MRI to evaluate for metastatic disease. 2. Asymmetric larynx, possible cord paresis on the left. 3. Supraclavicular/low cervical metastatic adenopathy seen in 2016 has improved. Currently no enlarged cervical lymph nodes. 4. Chest reported separately. Electronically Signed   By: JoMonte Fantasia.D.   On: 12/15/2015 10:28   Ct Chest W Contrast  12/15/2015  CLINICAL DATA:  [age]ear old female with history of left-sided breast cancer diagnosed in 2011 with none metastatic disease. Ongoing chemotherapy. Difficulty swallowing for the past 2 weeks. EXAM: CT CHEST, ABDOMEN, AND PELVIS WITH CONTRAST TECHNIQUE: Multidetector CT imaging of the chest, abdomen and pelvis was performed  following the standard protocol during bolus administration of intravenous contrast. CONTRAST:  5014mMNIPAQUE IOHEXOL 300 MG/ML SOLN, 100m69mNIPAQUE IOHEXOL 300 MG/ML SOLN COMPARISON:  CT the chest, abdomen and pelvis 09/22/2015. PET-CT 07/18/2015. FINDINGS: CT CHEST FINDINGS Mediastinum/Lymph Nodes: Further increase in metastatic lymphadenopathy throughout the mediastinum, most evident in the  anterior mediastinum where the largest enhancing nodal mass measures up to 3.5 x 6.1 cm (image 18 of series 2), previously only 2.7 x 4.6 cm on 09/22/2015. This lesion is contiguous with the left internal mammary nodal chain, which also demonstrates some enlarged lymph nodes. Marked enlargement of a low right paratracheal lymph node which currently measures 1.7 cm in short axis (previously nonenlarged). Heart size is borderline enlarged. There is no significant pericardial fluid, thickening or pericardial calcification. There is atherosclerosis of the thoracic aorta, the great vessels of the mediastinum and the coronary arteries, including calcified atherosclerotic plaque in the left main, left anterior descending and right coronary arteries. Mild calcifications of the mitral annulus and aortic valve. Esophagus is unremarkable in appearance. No axillary lymphadenopathy. Right-sided internal jugular single-lumen porta cath with tip terminating at the superior cavoatrial junction. Lungs/Pleura: No suspicious appearing pulmonary nodules or masses. No acute consolidative airspace disease. No pleural effusions. Musculoskeletal/Soft Tissues: Soft tissue mass in the central aspect of the left breast measuring up to 2.9 x 2.2 cm (image 35 of series 2), similar to prior examination. Some overlying skin thickening is noted. Irregular sclerosis and lucency is again noted in the manubrium, similar to the prior examination. There is also a small lucent lesion in the left side of the sternum measuring 7 mm (image 23 of series 7) which is  similar to prior examinations. CT ABDOMEN AND PELVIS FINDINGS Hepatobiliary: No suspicious cystic or solid hepatic lesions. No intra or extrahepatic biliary ductal dilatation. Gallbladder is normal in appearance. Pancreas: No pancreatic mass. No pancreatic ductal dilatation. No pancreatic or peripancreatic fluid or inflammatory changes. Spleen: Unremarkable. Adrenals/Urinary Tract: Several sub cm low-attenuation lesions are noted in the kidneys bilaterally, similar to prior examinations. Although these are too small to definitively characterize, these are presumably small cysts. Bilateral adrenal glands are normal in appearance. No hydroureteronephrosis. Urinary bladder is nearly decompressed, but otherwise unremarkable in appearance. Stomach/Bowel: Normal appearance of the stomach. No pathologic dilatation of small bowel or colon. Normal appendix. Vascular/Lymphatic: Atherosclerosis throughout the abdominal and pelvic vasculature, without evidence of aneurysm or dissection. No lymphadenopathy noted in the abdomen or pelvis. Reproductive: Uterus and ovaries are atrophic. Other: No significant volume of ascites.  No pneumoperitoneum. Musculoskeletal: New osteolysis involving the superior aspect of the right ilium with some extension of tumor into the overlying right gluteal and iliacus musculature. 12 mm of anterolisthesis of L5 upon S1. Bilateral pars defects at L5. IMPRESSION: 1. Today's study demonstrates progression of metastatic disease, with interval increase in mediastinal lymphadenopathy, and new development of a large lytic lesion in the superior aspect of the right ilium. 2. 2.9 x 2.2 cm mass in the central aspect of the left breast is similar to prior examinations. 3. Irregularities in the manubrium and sternum, similar to prior examinations, potentially either treatment related or treated metastatic disease. 4. Additional incidental findings, similar to prior examinations, as above. Electronically Signed    By: Vinnie Langton M.D.   On: 12/15/2015 10:37   Ct Angio Chest Pe W/cm &/or Wo Cm  01/19/2016  CLINICAL DATA:  Difficulty breathing the past 3 days. Coughing with sputum. Admitted yesterday to hospice. Increased weakness with shaking. EXAM: CT ANGIOGRAPHY CHEST WITH CONTRAST TECHNIQUE: Multidetector CT imaging of the chest was performed using the standard protocol during bolus administration of intravenous contrast. Multiplanar CT image reconstructions and MIPs were obtained to evaluate the vascular anatomy. CONTRAST:  158m OMNIPAQUE IOHEXOL 350 MG/ML SOLN COMPARISON:  Chest x-ray from earlier same  day and chest CT dated 12/15/2015. FINDINGS: Anterior mediastinal mass measuring 5.6 x 3.4 cm, stable compared to the most recent chest CT. By report, this measured 4.6 x 2.7 cm on 09/22/2015. Lower right paratracheal lymph node is stable measuring 1.9 cm short axis dimension. Additional nodular mass within the aorta pulmonary window region is stable at 1.4 cm short axis dimension. No new masses or enlarged lymph nodes within the mediastinum or perihilar regions. The majority of the peripheral segmental and subsegmental pulmonary arteries cannot be definitively characterized due to patient breathing motion artifact. There is no central obstructing pulmonary embolism identified within the main, lobar or central segmental pulmonary arteries. Thoracic aorta is normal in caliber and configuration. No aortic aneurysm or dissection. Heart size is upper normal. No pericardial effusion. Lungs are clear. No evidence of pneumonia. No pleural effusion. No pneumothorax. Trachea and central bronchi are unremarkable. Limited images of the upper abdomen are unremarkable. Left breast mass is stable in appearance, measuring approximately 2.8 cm greatest dimension, compatible with patient's history of breast cancer and/or postsurgical change. No acute osseous abnormality appreciated. Degenerative changes again noted within the  thoracic spine. Irregular sclerosis and lucency within the sternum is stable and may be related to post treatment change. Review of the MIP images confirms the above findings. IMPRESSION: 1. Stable exam. The mediastinal masses and lymphadenopathy are stable compared to the most recent chest CT of 12/15/2015, although progression was noted in the last CT report when compared to an earlier CT of 09/22/2015. 2. No acute findings. No pulmonary embolism seen, with study limitations detailed above. Lungs are clear. Electronically Signed   By: Franki Cabot M.D.   On: 01/02/2016 22:00   Mr Jeri Cos DD Contrast  12/24/2015  CLINICAL DATA:  Altered mental status. Noncommunicating. History of breast cancer, assess for metastasis. EXAM: MRI HEAD WITHOUT AND WITH CONTRAST TECHNIQUE: Multiplanar, multiecho pulse sequences of the brain and surrounding structures were obtained without and with intravenous contrast. CONTRAST:  78m MULTIHANCE GADOBENATE DIMEGLUMINE 529 MG/ML IV SOLN COMPARISON:  CT neck December 23, 2015 FINDINGS: At least 20 infratentorial and greater than 20 supratentorial enhancing parenchymal masses, including 12 mm mass prior pontomedullary junction, multiple subcentimeter masses in the bilateral thalamus, bilateral basal ganglia and, at gray-white matter junction, extending into the deep white matter. 1 cm mass in LEFT anterior cranial fossa and may be extra-axial, sagittal 19/24. Largest supratentorial mass in LEFT frontal lobe measuring up to 18 mm. 11 mm mass RIGHT parasagittal splenium of corpus callosum. Masses demonstrate reduced diffusion, in T2 shine through. No susceptibility artifact to suggest hemorrhage. Masses demonstrate bright FLAIR T2 hyperintense vasogenic edema without significant mass effect. No midline shift. No reduced diffusion to suggest acute ischemia. Ventricles and sulci are generally normal for patient's age. No abnormal extra-axial fluid collections. No extra-axial masses nor  leptomeningeal enhancement. Normal major intracranial vascular flow voids seen at the skull base. Ocular globes and orbital contents are unremarkable though not tailored for evaluation. No suspicious calvarial bone marrow signal. No abnormal sellar expansion. Craniocervical junction maintained. Visualized paranasal sinuses and mastoid air cells are well-aerated. IMPRESSION: At least 20 infratentorial and greater than 20 relatively small supratentorial intraparenchymal metastasis. Local mass effect without midline shift. 1 cm LEFT anterior cranial fossa possible dural based metastasis. Electronically Signed   By: CElon AlasM.D.   On: 12/24/2015 22:27   Ct Abdomen Pelvis W Contrast  12/15/2015  CLINICAL DATA:  70year old female with history of left-sided breast cancer diagnosed  in 2011 with none metastatic disease. Ongoing chemotherapy. Difficulty swallowing for the past 2 weeks. EXAM: CT CHEST, ABDOMEN, AND PELVIS WITH CONTRAST TECHNIQUE: Multidetector CT imaging of the chest, abdomen and pelvis was performed following the standard protocol during bolus administration of intravenous contrast. CONTRAST:  46m OMNIPAQUE IOHEXOL 300 MG/ML SOLN, 1069mOMNIPAQUE IOHEXOL 300 MG/ML SOLN COMPARISON:  CT the chest, abdomen and pelvis 09/22/2015. PET-CT 07/18/2015. FINDINGS: CT CHEST FINDINGS Mediastinum/Lymph Nodes: Further increase in metastatic lymphadenopathy throughout the mediastinum, most evident in the anterior mediastinum where the largest enhancing nodal mass measures up to 3.5 x 6.1 cm (image 18 of series 2), previously only 2.7 x 4.6 cm on 09/22/2015. This lesion is contiguous with the left internal mammary nodal chain, which also demonstrates some enlarged lymph nodes. Marked enlargement of a low right paratracheal lymph node which currently measures 1.7 cm in short axis (previously nonenlarged). Heart size is borderline enlarged. There is no significant pericardial fluid, thickening or pericardial  calcification. There is atherosclerosis of the thoracic aorta, the great vessels of the mediastinum and the coronary arteries, including calcified atherosclerotic plaque in the left main, left anterior descending and right coronary arteries. Mild calcifications of the mitral annulus and aortic valve. Esophagus is unremarkable in appearance. No axillary lymphadenopathy. Right-sided internal jugular single-lumen porta cath with tip terminating at the superior cavoatrial junction. Lungs/Pleura: No suspicious appearing pulmonary nodules or masses. No acute consolidative airspace disease. No pleural effusions. Musculoskeletal/Soft Tissues: Soft tissue mass in the central aspect of the left breast measuring up to 2.9 x 2.2 cm (image 35 of series 2), similar to prior examination. Some overlying skin thickening is noted. Irregular sclerosis and lucency is again noted in the manubrium, similar to the prior examination. There is also a small lucent lesion in the left side of the sternum measuring 7 mm (image 23 of series 7) which is similar to prior examinations. CT ABDOMEN AND PELVIS FINDINGS Hepatobiliary: No suspicious cystic or solid hepatic lesions. No intra or extrahepatic biliary ductal dilatation. Gallbladder is normal in appearance. Pancreas: No pancreatic mass. No pancreatic ductal dilatation. No pancreatic or peripancreatic fluid or inflammatory changes. Spleen: Unremarkable. Adrenals/Urinary Tract: Several sub cm low-attenuation lesions are noted in the kidneys bilaterally, similar to prior examinations. Although these are too small to definitively characterize, these are presumably small cysts. Bilateral adrenal glands are normal in appearance. No hydroureteronephrosis. Urinary bladder is nearly decompressed, but otherwise unremarkable in appearance. Stomach/Bowel: Normal appearance of the stomach. No pathologic dilatation of small bowel or colon. Normal appendix. Vascular/Lymphatic: Atherosclerosis throughout  the abdominal and pelvic vasculature, without evidence of aneurysm or dissection. No lymphadenopathy noted in the abdomen or pelvis. Reproductive: Uterus and ovaries are atrophic. Other: No significant volume of ascites.  No pneumoperitoneum. Musculoskeletal: New osteolysis involving the superior aspect of the right ilium with some extension of tumor into the overlying right gluteal and iliacus musculature. 12 mm of anterolisthesis of L5 upon S1. Bilateral pars defects at L5. IMPRESSION: 1. Today's study demonstrates progression of metastatic disease, with interval increase in mediastinal lymphadenopathy, and new development of a large lytic lesion in the superior aspect of the right ilium. 2. 2.9 x 2.2 cm mass in the central aspect of the left breast is similar to prior examinations. 3. Irregularities in the manubrium and sternum, similar to prior examinations, potentially either treatment related or treated metastatic disease. 4. Additional incidental findings, similar to prior examinations, as above. Electronically Signed   By: DaVinnie Langton.D.   On:  12/15/2015 10:37   Dg Esophagus  12/15/2015  CLINICAL DATA:  Dysphagia with solids. History of breast cancer status post lumpectomy and radiation. EXAM: ESOPHOGRAM / BARIUM SWALLOW / BARIUM TABLET STUDY TECHNIQUE: Combined double contrast and single contrast examination performed using effervescent crystals, thick barium liquid, and thin barium liquid. The patient was observed with fluoroscopy swallowing a 13 mm barium sulphate tablet. FLUOROSCOPY TIME:  Radiation Exposure Index (as provided by the fluoroscopic device): 24.62 d Gy cm2 COMPARISON:  None. FINDINGS: Normal esophageal peristalsis. No fixed esophageal narrowing or stricture. A 13 mm barium tablet passed into the stomach without delay. Small hiatal hernia. Patient could not tolerate prone swallows. Gastroesophageal reflux could not be assessed. IMPRESSION: No fixed esophageal narrowing or  stricture. Small hiatal hernia. Electronically Signed   By: Julian Hy M.D.   On: 12/15/2015 12:33   Dg Chest Port 1 View  01/04/2016  CLINICAL DATA:  Increased weakness, worsened breathing, increased sputum. History of left breast cancer with brain metastases. EXAM: PORTABLE CHEST 1 VIEW COMPARISON:  Chest x-ray dated 02/21/2015. Comparison also made to chest CT of 12/15/2015. FINDINGS: Cardiomediastinal silhouette appears stable in size and configuration. Some fullness at the left hilum is likely related to the metastatic lymphadenopathy identified within the mediastinum on earlier chest CT. Right chest wall Port-A-Cath now in place with tip well-positioned at the level of the lower SVC. Lungs are clear. No pleural effusion. No pneumothorax. Osseous structures about the chest are unremarkable. IMPRESSION: Fullness in the left hilum likely related to the metastatic lymphadenopathy identified within the mediastinum on earlier chest CT. Lungs are clear and there is no evidence of acute cardiopulmonary abnormality. No evidence of pneumonia. Electronically Signed   By: Franki Cabot M.D.   On: 12/26/2015 18:45         Subjective: Pt intubated on vent.  Unresponsive to protopathic stimuli.  No distress.  No vomiting or diarrhea reported  Objective: Filed Vitals:   01/07/16 2130 01/08/16 0045 01/08/16 0620 01/08/16 0712  BP: 123/69 128/68    Pulse: 77 78    Temp: 98.2 F (36.8 C)     TempSrc: Axillary     Resp: 20     Height:    5' 5"  (1.651 m)  Weight:      SpO2: 97% 99% 78%    No intake or output data in the 24 hours ending 01/08/16 0726 Weight change:  Exam:   General:  Pt is alert, follows commands appropriately, not in acute distress  HEENT: No icterus, No meningismus,No neck mass, [place]/AT  Cardiovascular: RRR, S1/S2, no rubs, no gallops  Respiratory: bibasilar rales.  No wheeze.  Abdomen: Soft/+BS, non distended, no guarding, no hepatosplenomegaly  Extremities: No  edema, No lymphangitis, No petechiae, No rashes, no synovitis  Data Reviewed: Basic Metabolic Panel:  Recent Labs Lab 12/26/2015 1810 01/06/16 0228 01/07/16 0500 01/08/16 0500  NA 135 138 139 138  K 4.2 3.9 4.1 4.3  CL 103 104 107 109  CO2 24 25 23 24   GLUCOSE 176* 135* 147* 108*  BUN 24* 18 21* 19  CREATININE 0.44 0.37* 0.37* 0.37*  CALCIUM 8.0* 7.7* 7.1* 6.8*   Liver Function Tests:  Recent Labs Lab 01/06/2016 1810 01/06/16 0228  AST 19 17  ALT 20 19  ALKPHOS 88 80  BILITOT 0.8 0.8  PROT 7.4 6.8  ALBUMIN 3.8 3.6   No results for input(s): LIPASE, AMYLASE in the last 168 hours.  Recent Labs Lab 01/15/2016 2159  AMMONIA  58*   CBC:  Recent Labs Lab 01/20/2016 1810 01/06/16 0228 01/07/16 0500 01/08/16 0500  WBC 2.5* 1.7* 1.8* 2.5*  NEUTROABS 2.2 1.2*  --   --   HGB 12.3 11.8* 11.0* 11.4*  HCT 35.6* 35.8* 32.3* 35.5*  MCV 82.6 86.7 83.2 88.1  PLT 129* 112* 112* 110*   Cardiac Enzymes: No results for input(s): CKTOTAL, CKMB, CKMBINDEX, TROPONINI in the last 168 hours. BNP: Invalid input(s): POCBNP CBG:  Recent Labs Lab 01/06/16 0757 01/08/16 0114  GLUCAP 91 128*    Recent Results (from the past 240 hour(s))  Blood Culture (routine x 2)     Status: None (Preliminary result)   Collection Time: 01/03/2016  6:00 PM  Result Value Ref Range Status   Specimen Description BLOOD RIGHT ARM  Final   Special Requests BOTTLES DRAWN AEROBIC AND ANAEROBIC 5CC  Final   Culture   Final    NO GROWTH 2 DAYS Performed at [facility]    Report Status PENDING  Incomplete  Blood Culture (routine x 2)     Status: None (Preliminary result)   Collection Time: 12/27/2015  6:00 PM  Result Value Ref Range Status   Specimen Description BLOOD LEFT ARM  Final   Special Requests BOTTLES DRAWN AEROBIC AND ANAEROBIC 5CC  Final   Culture   Final    NO GROWTH 2 DAYS Performed at [facility]    Report Status PENDING  Incomplete  Urine culture     Status: None    Collection Time: 01/06/2016 10:38 PM  Result Value Ref Range Status   Specimen Description URINE, CLEAN CATCH  Final   Special Requests NONE  Final   Culture   Final    MULTIPLE SPECIES PRESENT, SUGGEST RECOLLECTION Performed at [facility]    Report Status 01/07/2016 FINAL  Final  MRSA PCR Screening     Status: None   Collection Time: 01/06/16  2:38 AM  Result Value Ref Range Status   MRSA by PCR NEGATIVE NEGATIVE Final    Comment:        The GeneXpert MRSA Assay (FDA approved for NASAL specimens only), is one component of a comprehensive MRSA colonization surveillance program. It is not intended to diagnose MRSA infection nor to guide or monitor treatment for MRSA infections.      Scheduled Meds: . acetylcysteine  4 mL Nebulization Once  . ampicillin-sulbactam (UNASYN) IV  3 g Intravenous Q8H  . atropine  2 drop Sublingual QID  . dexamethasone  4 mg Oral TID  . enoxaparin (LOVENOX) injection  40 mg Subcutaneous Q24H  . sodium chloride flush  3 mL Intravenous Q12H   Continuous Infusions:    Helton Oleson, DO  [facility] Pager [phone]  If 7PM-7AM, please contact night-coverage [email or web address] Password [email or web address] 01/08/2016, 7:26 AM   LOS: 3 days

## 2016-01-08 NOTE — ED Provider Notes (Signed)
This is a 70 year old [place] woman with widely metastatic cancer admitted on the 13th of this month for breathing difficulty. Her work of breathing had worsened since midnight this morning and about 6:10 AM  She was found to be agonal with oxygen saturation in the 30s. An overhead CODE BLUE was called and I responded. On arrival her respirations were rapid and shallow with poor air movement bilaterally.  The rapid response team was already present and were assisting her ventilation with bag valve mask which improved her oxygen saturation to 100%  But she could not maintain the saturation on her own. Arterial blood gas was obtained which showed a pH of 6.9 and a PCO2 off-scale high. Because her family wanted everything done  The decision was made to intubate her. Her vocal cords were visualized with a #3 MacIntosh laryngoscope and she was intubated with a 7.5 millimeter endotracheal tube. The cuff was inflated and bag valve tube ventilation initiated. There was immediate color change on the CO2 detector and breath sounds were equal bilaterally with diffuse rales. Tube placement was verified with chest x-ray and it was taped in place by respiratory therapy.  Two ampoules of sodium bicarbonate were given for her severe acidosis. The nurse practitioner representing [facility] arrived and she arranged transfer to the ICU.  CRITICAL CARE Performed by: Shanon Rosser L Total critical care time: 35 minutes Critical care time was exclusive of separately billable procedures and treating other patients. Critical care was necessary to treat or prevent imminent or life-threatening deterioration. Critical care was time spent personally by me on the following activities: development of treatment plan with patient and/or surrogate as well as nursing, discussions with consultants, evaluation of patient's response to treatment, examination of patient, obtaining history from patient or surrogate, ordering and  performing treatments and interventions, ordering and review of laboratory studies, ordering and review of radiographic studies, pulse oximetry and re-evaluation of patient's condition.     Shanon Rosser, MD 01/08/16 [phone]

## 2016-01-08 NOTE — Anesthesia Procedure Notes (Signed)
Procedure Name: Intubation Date/Time: 01/08/2016 7:05 AM Performed by: West Pugh Pre-anesthesia Checklist: Patient identified, Emergency Drugs available, Suction available, Patient being monitored and Timeout performed Patient Re-evaluated:Patient Re-evaluated prior to inductionOxygen Delivery Method: Ambu bag Preoxygenation: Pre-oxygenation with 100% oxygen Intubation Type: IV induction Ventilation: Mask ventilation without difficulty Laryngoscope Size: Mac and 4 Grade View: Grade II Tube type: Subglottic suction tube Tube size: 7.5 mm Number of attempts: 1 Airway Equipment and Method: Stylet Placement Confirmation: ETT inserted through vocal cords under direct vision,  positive ETCO2,  CO2 detector and breath sounds checked- equal and bilateral Secured at: 22 cm Tube secured with: Tape Dental Injury: Teeth and Oropharynx as per pre-operative assessment

## 2016-01-08 NOTE — Progress Notes (Addendum)
PULMONARY / CRITICAL CARE MEDICINE   Name: Michele Hale MRN: [id number] DOB: 02/18/1946    ADMISSION DATE:  01/14/2016 CONSULTATION DATE:  01/08/16  REFERRING MD:  [facility]/ Dr.David Tat  CHIEF COMPLAINT:  Respiratory Arrest  HISTORY OF PRESENT ILLNESS:   70 y.o. female with PMH of left breast cancer in 2012,with widely metastatic disease to the liver , lung and brain diagnosed in May 2016.She presented to the emergency department on 01/06/2016 with respiratory distress and depressed level of consciousness. She has been receiving palliative whole-brain radiation and home hospice services.The patient's condition has been rapidly declining, with labored respirations requiring oxygen, however without fever, or acute findiings on CXR/ CT. No evidence of pneumonia or significant progression of metastatic lesions since most recent prior CT Chest.There is concern for aspiration pneumonia as patient is having trouble handing secretions.Earlier efforts to decrease patient's opiates for somnolence were resisted per family who wanted pain controlled.Palliative care have met with family, who continue to desire full scope of care despite very poor prognosis. The patient suffered a respiratory arrest early am 01/08/16 on 3 W, was intubated and transferred to ICU.Initial intubation was  Gastric, and patient was re-intubated per anesthesia upon arrival to ICU.CCM has been asked to assume care.  PAST MEDICAL HISTORY :  She  has a past medical history of Abscess; IBS (irritable bowel syndrome); Breast mass; Osteoporosis; Cancer ([facility]); Radiation (01/13/2011-02/17/2011); and Brain metastases ([place]).  PAST SURGICAL HISTORY: She  has past surgical history that includes Cesarean section and Breast lumpectomy.  No Known Allergies  No current facility-administered medications on file prior to encounter.   Current Outpatient Prescriptions on File Prior to Encounter  Medication Sig  . acetaminophen (TYLENOL)  500 MG tablet Take 1,000 mg by mouth every 6 (six) hours as needed. For arthritis pain  . dexamethasone (DECADRON) 4 MG tablet Take 4 mg by mouth 3 (three) times daily. Called in to [facility] on 12/30/15.  Disp. 60 tablets. 0 refills.  . lidocaine-prilocaine (EMLA) cream Apply to port-a-cath one and a half hours before use.  Cover cream with plastic wrap to seal.  . promethazine (PHENERGAN) 25 MG tablet Take 1 tablet (25 mg total) by mouth every 6 (six) hours as needed for nausea or vomiting.  Marland Kitchen albuterol (PROVENTIL) (2.5 MG/3ML) 0.083% nebulizer solution Take 3 mLs (2.5 mg total) by nebulization every 4 (four) hours as needed for wheezing or shortness of breath. (Patient not taking: Reported on 01/06/2016)  . cholecalciferol (VITAMIN D) 1000 UNITS tablet Take 1 tablet (1,000 Units total) by mouth daily. (Patient not taking: Reported on 12/30/2015)  . HYDROcodone-acetaminophen (NORCO/VICODIN) 5-325 MG per tablet Take 1-2 tablets by mouth every 4 (four) hours as needed. (Patient not taking: Reported on 01/17/2016)  . ibuprofen (ADVIL,MOTRIN) 800 MG tablet Take 1 tablet (800 mg total) by mouth every 8 (eight) hours as needed. (Patient not taking: Reported on 01/08/2016)  . loratadine (CLARITIN) 10 MG tablet Take 1 tablet (10 mg total) by mouth daily. (Patient not taking: Reported on 01/02/2016)  . LORazepam (ATIVAN) 0.5 MG tablet Take 1 tablet (0.5 mg total) by mouth every 4 (four) hours as needed for anxiety. (Patient not taking: Reported on 01/09/2016)  . magnesium oxide (MAG-OX) 400 (241.3 MG) MG tablet Take 1 tablet (400 mg total) by mouth daily. (Patient not taking: Reported on 12/29/2015)    FAMILY HISTORY:  Her indicated that her mother is deceased. She indicated that her father is deceased. She indicated that all  of her three sisters are alive. She indicated that all of her six brothers are alive.   SOCIAL HISTORY: She  reports that she has never smoked. She does not have any  smokeless tobacco history on file. She reports that she does not drink alcohol or use illicit drugs.  REVIEW OF SYSTEMS:   -Pt. Is intubated and unresponsive. Unable to obtain.  SUBJECTIVE:  Critically ill intubated elderly female patient supine in bed,family at bedside. Not assisting the vent.  VITAL SIGNS: BP 86/64 mmHg  Pulse 108  Temp(Src) 98.3 F (36.8 C) (Axillary)  Resp 16  Ht 5' 5"  (1.651 m)  Wt 133 lb 13.1 oz (60.7 kg)  BMI 22.96 kg/m2  SpO2 100%  HEMODYNAMICS:  Hypotension  VENTILATOR SETTINGS: Vent Mode:  [-] PRVC FiO2 (%):  [100 %] 100 % Set Rate:  [20 bmp] 20 bmp Vt Set:  [460 mL] 460 mL PEEP:  [5 cmH20] 5 cmH20 Plateau Pressure:  [15 cmH20] 15 cmH20  INTAKE / OUTPUT:    PHYSICAL EXAMINATION: General: Pt. Unresponsive, intubated. Neuro: Facial grimace to sternal rub, opens eyes does not focus, follows no commands. Withdraws to painful stimuli.  HEENT: No icterus, No meningismus,No neck mass, [place]/AT  Cardiovascular:  RRR, S1/S2, no rubs, no gallops Lungs: Bibasilar rales with rhonchi throughout. Scant brown secretions with suction  Abdomen:Soft/+BS, non distended, no guarding, no hepatosplenomegaly   Extremities:  Intact without bruising, petechiae or rash ,No edema  LABS:  BMET  Recent Labs Lab 01/06/16 0228 01/07/16 0500 01/08/16 0500  NA 138 139 138  K 3.9 4.1 4.3  CL 104 107 109  CO2 25 23 24   BUN 18 21* 19  CREATININE 0.37* 0.37* 0.37*  GLUCOSE 135* 147* 108*    Electrolytes  Recent Labs Lab 01/06/16 0228 01/07/16 0500 01/08/16 0500  CALCIUM 7.7* 7.1* 6.8*    CBC  Recent Labs Lab 01/06/16 0228 01/07/16 0500 01/08/16 0500  WBC 1.7* 1.8* 2.5*  HGB 11.8* 11.0* 11.4*  HCT 35.8* 32.3* 35.5*  PLT 112* 112* 110*    Coag's  Recent Labs Lab 01/06/16 0031 01/06/16 0228  APTT 25  --   INR  --  1.26    Sepsis Markers  Recent Labs Lab 12/24/2015 2253 01/06/16 0031 01/06/16 0228  LATICACIDVEN 1.85 1.3 1.2   PROCALCITON  --  <0.10  --     ABG  Recent Labs Lab 12/29/2015 1928 01/08/16 0040 01/08/16 0615  PHART 7.410 7.366 6.970*  PCO2ART 35.6 41.0 PENDING  PO2ART 74.5* 91.8 167*    Liver Enzymes  Recent Labs Lab 01/08/2016 1810 01/06/16 0228  AST 19 17  ALT 20 19  ALKPHOS 88 80  BILITOT 0.8 0.8  ALBUMIN 3.8 3.6    Cardiac Enzymes No results for input(s): TROPONINI, PROBNP in the last 168 hours.  Glucose  Recent Labs Lab 01/06/16 0757 01/08/16 0114  GLUCAP 91 128*    Imaging Dg Chest 1 View  01/08/2016  CLINICAL DATA:  Intubation EXAM: CHEST 1 VIEW COMPARISON:  01/08/2016 FINDINGS: Endotracheal tube is low at the left bronchus. Recommend withdrawal of 4 cm. Port-A-Cath tip in the SVC. Hypoventilation with mild bibasilar atelectasis. IMPRESSION: Endotracheal tube left main bronchus.  Recommend withdrawal 4 cm. These results were called by telephone at the time of interpretation on 01/08/2016 at 7:17 am to [facility], who verbally acknowledged these results. Electronically Signed   By: Franchot Gallo M.D.   On: 01/08/2016 07:17   Dg Chest 1 View  01/08/2016  CLINICAL DATA:  Short of breath.  Decreased oxygen saturation. EXAM: CHEST 1 VIEW COMPARISON:  01/17/2016 FINDINGS: Cardiac enlargement. Negative for heart failure. Negative for pneumonia. Anterior mediastinal mass is best noted on recent CT scan. Port-A-Cath tip in the SVC. IMPRESSION: Cardiac enlargement. No superimposed acute cardiopulmonary abnormality and no change from the prior study. Electronically Signed   By: Franchot Gallo M.D.   On: 01/08/2016 07:08     STUDIES:  12/15/15: CT Chest:  IMPRESSION: 1. Today's study demonstrates progression of metastatic disease, with interval increase in mediastinal lymphadenopathy, and new development of a large lytic lesion in the superior aspect of the right ilium. 2. 2.9 x 2.2 cm mass in the central aspect of the left breast is similar to prior examinations. 3.  Irregularities in the manubrium and sternum, similar to prior examinations, potentially either treatment related or treated metastatic disease.  12/24/15: MRI Brain:  IMPRESSION: At least 20 infratentorial and greater than 20 relatively small supratentorial intraparenchymal metastasis. Local mass effect without midline shift. 1 cm LEFT anterior cranial fossa possible dural based metastasis.  01/08/16:  Chest XRAY:  Cardiac Enlargement, no heart failure, no pneumonia, anterior mediastinal mass  CULTURES: 01/15/2016: Blood Cx:>>> pending 01/08/16: Sputum Culture>>>  ANTIBIOTICS: 01/07/16: Unasyn>>>  SIGNIFICANT EVENTS: 12/24/2015: Admission for Resp. Distress 01/08/16: Respiratory Arrest, Intubation/ transfer to ICU  LINES/TUBES: Right Port a cath  DISCUSSION: Need to continue to address code status with family. Both Hospice and Palliative care are actively involved with the patient.Will check EEG to ensure no seizure activity that can be treated.Will initiate tube feedings and continue supportative care while awaiting EEG results at which point  goals of care will need to be established with family. ASSESSMENT / PLAN:  PULMONARY A: Respiratory Arrest, pH 6.97 ( CO2 pending) 2/2 brain mets/aspiration pneumonitis Dysphagia P:   Continue ABX as above CXR now to confirm ETT placement CXR daily while on vent Trend WBC/ CBC daily Wean FiO2 for SaO2 of 92%  CARDIOVASCULAR A: Sinus Rhythm Hypotension  P:  Telemetry Monitoring Fluid bolus  Map Goal of 70  RENAL A: No Acute Issues   P:  Place foley  Strict I&O Monitor renal function daily with BUN/Creatinine Correct lytes as needed  GASTROINTESTINAL A:   OG tube with dark/ maroon  drainage P:   PPI Initiate tube feeds per OGT Dietary consult  HEMATOLOGIC A:  Pancytopenia 2/2/ marrow infiltration from metastatic cancer/chemo       Anemia       ? Heme + secretions from OGT P:  Transfuse for HGB <7 B12: RBC  folate Hep C antibody Hep b surface antibidy  INFECTIOUS A:   Afebrile, but high suspicion for aspiration 2/2 secretions ETT look gastric P:   Continue Unasyn Narrow coverage as sensitivities result Tracheal aspirate for culture Monitor fever curve/ WBC Daily CBC PCT per algorithm  ENDOCRINE A: Hyperglycemia without DM, suspect 2/2 steroids     P:   CBG Q4 while intubated SSI per algorithm  NEUROLOGIC A: Acute encephalopathy/ Multifactorial with known brain mets/steroids/opiate and hypnotic medications   P:  EEG today to rule out seizure activity Sedation as needed for vent synchrony   RASS goal: +1    FAMILY  - Updates:  Dr. Lake Bells spoke with the family this morning.He updated them on her current status and events leading up to intubation.He explained that their mother is critically ill and being maintained on life support He explained that we will check an EEG today  to determine if there are seizures that can be treated. If there are no seizures he explained that the reason for her confusion and respiratory demise are due to the cancer in her brain, and that there is little else to do. We will discuss goals of care after results of the EEG are available .Hospice is [place] get [place], but they have told the family it is doubtful she will arrive in time.  - Inter-disciplinary family meet or Palliative Care meeting due by:  01/15/16   Magdalen Spatz, NP [facility] [place] Pager: [phone]  01/08/2016, 8:54 AM

## 2016-01-08 NOTE — Progress Notes (Signed)
Nutrition Follow-up  DOCUMENTATION CODES:   Severe malnutrition in context of chronic illness  INTERVENTION:   - Recommend monitoring magnesium, potassium, and phosphorus daily for at least 3 days, MD to replete as needed, as patient is at risk for refeeding syndrome given severe malnutrition status and poor po intake x 2 weeks. - Initiate tube feeding with Vital AF 1.2 at 20 ml/hr and increase by 10 ml every 6 hours to a goal rate of 40 ml/hr.  Provide 30 mL Prostat BID, providing an additional 200 kcals and 30 grams of protein.    Tube feeding and Prostat regimen will provide 1352 kcals (104% of kcal needs), 102 grams protein (102% of protein needs), and 779 ml of water.  NUTRITION DIAGNOSIS:   Malnutrition related to chronic illness as evidenced by energy intake < or equal to 75% for > or equal to 1 month, percent weight loss.  Ongoing  GOAL:   Patient will meet greater than or equal to 90% of their needs  Unmet  MONITOR:   Vent status, Labs, Weight trends, TF tolerance, GOC  ASSESSMENT:   Michele Hale is an unfortunate 70 y.o. female with PMH of left breast cancer, widely metastatic, now presenting to the emergency department with respiratory distress and depressed level of consciousness  Consulted to initiate Enteral Nutrition.   Patient transferred to ICU this morning and is currently intubated on ventilator support.  MV: 9.1 ml/min  Temp (24hrs), Avg:98.1 F (36.7 C), Min:97.7 F (36.5 C), Max:98.3 F (36.8 C) OG Tube, verify placement prior to initiating tube feeding   Per RD note on 3/15, patient with minimal intake for several weeks and a significant weight loss of 7.6% over 2 weeks.  Patient at risk for refeeding syndrome due to severe malnutrition in the context of chronic illness.  Monitor magnesium, potassium, and phosphorus for a minimum of 3 days, MD to replete as necessary. Tube feeding recommendations provided above.  Medications and labs  reviewed.  Diet Order:  Diet NPO time specified  Skin:  Reviewed, no issues  Last BM:  PTA  Height:   Ht Readings from Last 1 Encounters:  01/08/16 5\' 5"  (1.651 m)    Weight:   Wt Readings from Last 1 Encounters:  01/06/16 133 lb 13.1 oz (60.7 kg)    Ideal Body Weight:  56.8 kg  BMI:  Body mass index is 22.27 kg/(m^2).  Estimated Nutritional Needs:   Kcal:  [id number]  Protein:  90-100 grams  Fluid:  >/= 1.5 L  EDUCATION NEEDS:   No education needs identified at this time  Veronda Prude, Dietetic Intern Pager: [phone]

## 2016-01-08 NOTE — Progress Notes (Signed)
Offsite EEG completed at [facility]; results pending. 

## 2016-01-08 NOTE — Progress Notes (Signed)
RT assisted CRNA with RE-intubation. Patient was intubated in the stomach on 3W. ETCO2 positive color change. Bilateral breath sounds present. ABG and CXR pending. RT will continue to monitor.

## 2016-01-09 ENCOUNTER — Encounter: Payer: Self-pay | Admitting: *Deleted

## 2016-01-09 ENCOUNTER — Inpatient Hospital Stay (HOSPITAL_COMMUNITY)

## 2016-01-09 ENCOUNTER — Ambulatory Visit: Payer: Medicaid Other

## 2016-01-09 DIAGNOSIS — G9340 Encephalopathy, unspecified: Secondary | ICD-10-CM

## 2016-01-09 DIAGNOSIS — Z7189 Other specified counseling: Secondary | ICD-10-CM

## 2016-01-09 DIAGNOSIS — J9601 Acute respiratory failure with hypoxia: Secondary | ICD-10-CM

## 2016-01-09 DIAGNOSIS — Z515 Encounter for palliative care: Secondary | ICD-10-CM

## 2016-01-09 DIAGNOSIS — J690 Pneumonitis due to inhalation of food and vomit: Principal | ICD-10-CM

## 2016-01-09 LAB — COMPREHENSIVE METABOLIC PANEL
ALBUMIN: 2.8 g/dL — AB (ref 3.5–5.0)
ALT: 45 U/L (ref 14–54)
AST: 41 U/L (ref 15–41)
Alkaline Phosphatase: 57 U/L (ref 38–126)
Anion gap: 9 (ref 5–15)
BILIRUBIN TOTAL: 0.6 mg/dL (ref 0.3–1.2)
BUN: 20 mg/dL (ref 6–20)
CHLORIDE: 109 mmol/L (ref 101–111)
CO2: 23 mmol/L (ref 22–32)
CREATININE: 0.33 mg/dL — AB (ref 0.44–1.00)
Calcium: 6.7 mg/dL — ABNORMAL LOW (ref 8.9–10.3)
GFR calc Af Amer: >60 mL/min (ref >60)
GFR calc non Af Amer: >60 mL/min (ref >60)
GLUCOSE: 176 mg/dL — AB (ref 65–99)
POTASSIUM: 4 mmol/L (ref 3.5–5.1)
Sodium: 141 mmol/L (ref 135–145)
Total Protein: 5.7 g/dL — ABNORMAL LOW (ref 6.5–8.1)

## 2016-01-09 LAB — FOLATE RBC
FOLATE, RBC: 1397 ng/mL (ref >498)
Folate, Hemolysate: 484.7 ng/mL
Hematocrit: 34.7 % (ref 34.0–46.6)

## 2016-01-09 LAB — CBC
HEMATOCRIT: 29.8 % — AB (ref 36.0–46.0)
Hemoglobin: 9.8 g/dL — ABNORMAL LOW (ref 12.0–15.0)
MCH: 29.1 pg (ref 26.0–34.0)
MCHC: 32.9 g/dL (ref 30.0–36.0)
MCV: 88.4 fL (ref 78.0–100.0)
Platelets: 85 10*3/uL — ABNORMAL LOW (ref 150–400)
RBC: 3.37 MIL/uL — ABNORMAL LOW (ref 3.87–5.11)
RDW: 15.7 % — AB (ref 11.5–15.5)
WBC: 2.8 10*3/uL — ABNORMAL LOW (ref 4.0–10.5)

## 2016-01-09 LAB — GLUCOSE, CAPILLARY
GLUCOSE-CAPILLARY: 157 mg/dL — AB (ref 65–99)
GLUCOSE-CAPILLARY: 125 mg/dL — AB (ref 65–99)
Glucose-Capillary: 200 mg/dL — ABNORMAL HIGH (ref 65–99)
Glucose-Capillary: 186 mg/dL — ABNORMAL HIGH (ref 65–99)
Glucose-Capillary: 169 mg/dL — ABNORMAL HIGH (ref 65–99)

## 2016-01-09 LAB — HEPATITIS C ANTIBODY: HCV Ab: <0.1 s/co ratio (ref 0.0–0.9)

## 2016-01-09 LAB — HEPATITIS B SURFACE ANTIGEN: HEP B S AG: NEGATIVE

## 2016-01-09 MED ORDER — CHLORHEXIDINE GLUCONATE 0.12 % MT SOLN
15.0000 mL | Freq: Two times a day (BID) | OROMUCOSAL | Status: DC
  Administered 2016-01-09 (×2): 15 mL via OROMUCOSAL

## 2016-01-09 MED ORDER — MORPHINE SULFATE (PF) 2 MG/ML IV SOLN
2.0000 mg | INTRAVENOUS | Status: DC | PRN
  Administered 2016-01-09 (×2): 2 mg via INTRAVENOUS
  Administered 2016-01-10 (×2): 4 mg via INTRAVENOUS
  Administered 2016-01-10 (×2): 2 mg via INTRAVENOUS
  Filled 2016-01-09: qty 1
  Filled 2016-01-09: qty 2
  Filled 2016-01-09 (×3): qty 1
  Filled 2016-01-09: qty 2

## 2016-01-09 MED ORDER — CETYLPYRIDINIUM CHLORIDE 0.05 % MT LIQD
7.0000 mL | Freq: Two times a day (BID) | OROMUCOSAL | Status: DC
  Administered 2016-01-09 (×2): 7 mL via OROMUCOSAL

## 2016-01-09 MED ORDER — CHLORHEXIDINE GLUCONATE 0.12 % MT SOLN
15.0000 mL | Freq: Two times a day (BID) | OROMUCOSAL | Status: DC
Start: 2016-01-10 — End: 2016-01-10

## 2016-01-09 MED ORDER — LIP MEDEX EX OINT
TOPICAL_OINTMENT | CUTANEOUS | Status: AC
  Administered 2016-01-09: 18:00:00
  Filled 2016-01-09: qty 7

## 2016-01-09 MED ORDER — IPRATROPIUM-ALBUTEROL 0.5-2.5 (3) MG/3ML IN SOLN
3.0000 mL | Freq: Four times a day (QID) | RESPIRATORY_TRACT | Status: DC | PRN
Start: 2016-01-09 — End: 2016-01-10
  Administered 2016-01-09 – 2016-01-10 (×2): 3 mL via RESPIRATORY_TRACT
  Filled 2016-01-09 (×2): qty 3

## 2016-01-09 MED ORDER — SODIUM CHLORIDE 0.9 % IV SOLN
INTRAVENOUS | Status: DC
  Administered 2016-01-09 – 2016-01-10 (×4): via INTRAVENOUS

## 2016-01-09 MED ORDER — INSULIN ASPART 100 UNIT/ML ~~LOC~~ SOLN
0.0000 [IU] | SUBCUTANEOUS | Status: DC
  Administered 2016-01-09: 2 [IU] via SUBCUTANEOUS
  Administered 2016-01-09: 1 [IU] via SUBCUTANEOUS
  Administered 2016-01-09 (×2): 2 [IU] via SUBCUTANEOUS
  Administered 2016-01-09: 3 [IU] via SUBCUTANEOUS
  Administered 2016-01-09: 2 [IU] via SUBCUTANEOUS
  Administered 2016-01-10: 1 [IU] via SUBCUTANEOUS

## 2016-01-09 MED ORDER — CETYLPYRIDINIUM CHLORIDE 0.05 % MT LIQD
7.0000 mL | Freq: Two times a day (BID) | OROMUCOSAL | Status: DC
  Administered 2016-01-10: 7 mL via OROMUCOSAL

## 2016-01-09 NOTE — Evaluation (Signed)
Clinical/Bedside Swallow Evaluation Patient Details  Name: Michele Hale MRN: [id number] Date of Birth: [birth year]  Today's Date: 01/09/2016 Time: SLP Start Time (ACUTE ONLY): [id number] SLP Stop Time (ACUTE ONLY): 1505 SLP Time Calculation (min) (ACUTE ONLY): 37 min  Past Medical History:  Past Medical History  Diagnosis Date  . Abscess   . IBS (irritable bowel syndrome)   . Breast mass   . Osteoporosis   . Cancer ([place])     breast - lt  . Radiation 01/13/2011-02/17/2011    left breast 50 Gy  . Brain metastases [facility])    Past Surgical History:  Past Surgical History  Procedure Laterality Date  . Cesarean section    . Breast lumpectomy      lt breast   HPI:  70 yo female adm to [facility] with respiratory deficits - found to have pna.  Pt requiring intubation from 3/15-3/17/17.  She has h/o metastatic breast cancer and has undergone whole brain radiation in the past.  Swallow evaluation ordered.  Son Marinus Maw present and wanted to interpret instead of using IPAD.     Assessment / Plan / Recommendation Clinical Impression  At this time, pt sleepy but willing to accept ice chip boluses.  Breathing was deep with some accessory muscle use and near "snoring" at baseline with open mouth posture.  Gross weakness with pt voicing using whisper = few words only at a time and requiring stimulation to stay alert.    Small single ice chips administered using spoon - pt slowly masticate but was NOT observed to elicit a swallow = despite max cues.  Suspect collection of melted ice in pharynx mixing with secretions= as pt with delayed cough/wet gurgly breathing.  She did cough and propel some viscous secretions into oral cavity which SLP suctioned to remove- presumed aspiration.   Pt nodded off during session frequently- son ?s Impact of pain medication.     Marinus Maw, son, reports pt did have problems swallowing prior to admission resulting in excessive oral holding.  Coughing noted prior to admit with and without  intake per pt statement *through [facility].    Pt expressed desire for ice but did not express hunger/thirst.  Per RN, family wants to treat the "treatable", therefore pt's swallowing may improve with improvement in secretion management, medical improvement to allow modified diet.   Advised family to give pt ice chips only *single* with her sitting upright and fully alert.  Observing to pt to swallow and cueing cough and providing oral suction if indicated/needed.  Using teach back and explaining clinical reasoning, family education ongoing.   Son inquired when she will be seen again - advised will inform RN to page SLP on duty tomorrow if pt's status changes - becomes alert, improved secretion management and desires po.  Otherwise will follow up Monday 01/12/16.      Aspiration Risk  Severe aspiration risk;Risk for inadequate nutrition/hydration    Diet Recommendation Other (Comment);Ice chips PRN after oral care   Medication Administration: Via alternative means    Other  Recommendations Oral Care Recommendations: Oral care QID   Follow up Recommendations    TBD   Frequency and Duration min 2x/week  1 week       Prognosis Prognosis for Safe Diet Advancement: Fair Barriers to Reach Goals: Severity of deficits;Time post onset;Other (Comment) (comorbidities)      Swallow Study   General Date of Onset: 01/09/16 HPI: 70 yo female adm to [facility] with respiratory deficits - found to have  pna.  Pt requiring intubation from 3/15-3/17/17.  She has h/o metastatic breast cancer and has undergone whole brain radiation in the past.  Swallow evaluation ordered.  Son Marinus Maw present and wanted to interpret instead of using IPAD.   Type of Study: Bedside Swallow Evaluation Diet Prior to this Study: NPO Temperature Spikes Noted: Yes Respiratory Status: Nasal cannula History of Recent Intubation: Yes Length of Intubations (days): 3 days Date extubated: 01/09/16 (3 hours prior) Behavior/Cognition:  Lethargic/Drowsy;Requires cueing Oral Cavity Assessment: Excessive secretions Oral Care Completed by SLP: Yes Oral Cavity - Dentition: Adequate natural dentition Self-Feeding Abilities: Total assist Patient Positioning: Upright in bed Baseline Vocal Quality: Hoarse;Breathy;Low vocal intensity Volitional Cough: Weak;Other (Comment) (productive when clearing secretions) Volitional Swallow: Unable to elicit    Oral/Motor/Sensory Function Overall Oral Motor/Sensory Function: Generalized oral weakness (pt did not follow commands fully for OME, able to seal lips on oral suction with instruction, did not phonate adequately to observe palatal elevation, obvious voice weakness)   Ice Chips Ice chips: Impaired Presentation: Spoon Oral Phase Impairments: Reduced lingual movement/coordination Oral Phase Functional Implications: Prolonged oral transit Pharyngeal Phase Impairments: Change in Vital Signs;Cough - Immediate;Unable to trigger swallow   Thin Liquid Thin Liquid: Not tested    Nectar Thick Nectar Thick Liquid: Not tested   Honey Thick Honey Thick Liquid: Not tested   Puree Puree: Not tested   Solid   GO   Solid: Not tested        Luanna Salk, [facility] SLP [phone]

## 2016-01-09 NOTE — Procedures (Signed)
History: Michele Hale is an 70 y.o. female patient with altered mental status seizures. Patient has known intracranial metastatic disease with multiple parenchymal breast metastasis. Routine inpatient EEG was performed for further evaluation.   Patient Active Problem List   Diagnosis Date Noted  . Aspiration pneumonia ([place]) 01/07/2016  . DNR (do not resuscitate) discussion   . Palliative care encounter   . Respiratory failure ([place])   . Acute encephalopathy   . Cancer of left breast metastatic to brain ([place]) 12/27/2015  . Acute respiratory failure with hypoxia ([place]) 01/23/2016  . Malignant neoplasm metastatic to right lung ([place]) 01/02/2016  . Brain metastasis ([place]) 12/25/2015  . Dysphagia 11/26/2015  . Conjunctivitis 10/15/2015  . Tonsillitis 10/07/2015  . Lung metastases ([place]) 09/24/2015  . Chemotherapy-induced neuropathy ([place]) 09/24/2015  . Antineoplastic chemotherapy induced pancytopenia ([place]) 09/24/2015  . Liver metastases ([place]) 09/24/2015  . Mediastinal mass   . Nausea alone 05/20/2014  . Dysuria 05/20/2014  . Breast cancer of lower-outer quadrant of left female breast ([place]) 05/20/2014  . Estrogen deficiency 05/20/2014  . Right leg pain 01/02/2014  . Osteoporosis 04/11/2013  . Neck pain on left side 04/10/2013  . Allergic rhinitis 02/02/2013  . Osteoarthritis 02/02/2013  . Knee pain 02/02/2013  . Sciatica neuralgia 02/02/2013  . Lumbar radicular pain 02/02/2013    No current facility-administered medications for this encounter. No current outpatient prescriptions on file.  Facility-Administered Medications Ordered in Other Encounters:  .  0.9 %  sodium chloride infusion, , Intravenous, Continuous, Dianne Dun, NP, Last Rate: 125 mL/hr at 01/09/16 0800 .  acetaminophen (TYLENOL) tablet 650 mg, 650 mg, Oral, Q6H PRN **OR** acetaminophen (TYLENOL) suppository 650 mg, 650 mg, Rectal, Q6H PRN, Ilene Qua Opyd, MD .  acetylcysteine (MUCOMYST) 20 % nebulizer /  oral solution 4 mL, 4 mL, Nebulization, Once, Jeryl Columbia, NP .  Ampicillin-Sulbactam (UNASYN) 3 g in sodium chloride 0.9 % 100 mL IVPB, 3 g, Intravenous, Q8H, Orson Eva, MD, 3 g at 01/09/16 0540 .  antiseptic oral rinse solution (CORINZ), 7 mL, Mouth Rinse, QID, Magdalen Spatz, NP, 7 mL at 01/09/16 0552 .  atropine 1 % ophthalmic solution 2 drop, 2 drop, Sublingual, QID, Rhetta Mura Schorr, NP, 2 drop at 01/08/16 2225 .  bisacodyl (DULCOLAX) suppository 10 mg, 10 mg, Rectal, Daily PRN, Ilene Qua Opyd, MD .  chlorhexidine gluconate (PERIDEX) 0.12 % solution 15 mL, 15 mL, Mouth Rinse, BID, Magdalen Spatz, NP, 15 mL at 01/09/16 0800 .  dexamethasone (DECADRON) tablet 4 mg, 4 mg, Oral, TID, Ilene Qua Opyd, MD, 4 mg at 01/08/16 2225 .  enoxaparin (LOVENOX) injection 40 mg, 40 mg, Subcutaneous, Q24H, Ilene Qua Opyd, MD, 40 mg at 01/08/16 0928 .  feeding supplement (PRO-STAT SUGAR FREE 64) liquid 30 mL, 30 mL, Per Tube, BID, Clayton Bibles, RD, 30 mL at 01/08/16 2225 .  feeding supplement (VITAL AF 1.2 CAL) liquid 1,000 mL, 1,000 mL, Per Tube, Q24H, Clayton Bibles, RD, Last Rate: 40 mL/hr at 01/09/16 0800, 1,000 mL at 01/09/16 0800 .  fentaNYL (SUBLIMAZE) injection 50 mcg, 50 mcg, Intravenous, Q2H PRN, Magdalen Spatz, NP, 50 mcg at 01/09/16 0756 .  HYDROcodone-acetaminophen (NORCO/VICODIN) 5-325 MG per tablet 1-2 tablet, 1-2 tablet, Oral, Q4H PRN, Ilene Qua Opyd, MD .  insulin aspart (novoLOG) injection 0-9 Units, 0-9 Units, Subcutaneous, 6 times per day, Dianne Dun, NP, 2 Units at 01/09/16 0757 .  ipratropium-albuterol (DUONEB) 0.5-2.5 (3) MG/3ML nebulizer solution 3 mL, 3 mL, Nebulization,  Q6H, Magdalen Spatz, NP, 3 mL at 01/09/16 0820 .  LORazepam (ATIVAN) 2 MG/ML concentrated solution 1 mg, 1 mg, Oral, Q6H PRN, Vianne Bulls, MD, 1 mg at 01/07/16 2207 .  morphine CONCENTRATE 10 MG/0.5ML oral solution 5 mg, 5 mg, Oral, Q2H PRN, Knox Royalty, NP, 5 mg at 01/08/16 0544 .  ondansetron  (ZOFRAN) tablet 4 mg, 4 mg, Oral, Q6H PRN **OR** ondansetron (ZOFRAN) injection 4 mg, 4 mg, Intravenous, Q6H PRN, Ilene Qua Opyd, MD .  pantoprazole sodium (PROTONIX) 40 mg/20 mL oral suspension 40 mg, 40 mg, Per Tube, Daily, Magdalen Spatz, NP, 40 mg at 01/08/16 1235 .  polyethylene glycol (MIRALAX / GLYCOLAX) packet 17 g, 17 g, Oral, Daily PRN, Ilene Qua Opyd, MD .  sodium chloride flush (NS) 0.9 % injection 3 mL, 3 mL, Intravenous, Q12H, Vianne Bulls, MD, 3 mL at 01/08/16 2226   Introduction:  This is a 19 channel routine scalp EEG performed at the bedside with bipolar and monopolar montages arranged in accordance to the international 10/20 system of electrode placement. One channel was dedicated to EKG recording.   Findings:  Generalized background slowing in the mid theta range is noted, best background activity in the 6-7 Hz range. Occasional abnormal epilepsy from discharges in the form of sharps are seen, predominantly in the left frontotemporal areas. No definite evidence of electrographic seizures were noted during this recording.   Impression:  Abnormal routine inpatient EEG suggestive of mild to moderate encephalopathy as described, with occasional abnormal epileptiform discharges, without evidence of seizures on this recording. Clinical correlation is recommended .

## 2016-01-09 NOTE — Progress Notes (Signed)
Daily Progress Note   Patient Name: Michele Hale       Date: 01/09/2016 DOB: [birth year]  Age: 70 y.o. MRN#: [id number] Attending Physician: Orson Eva, MD Primary Care Physician: Angelica Chessman, MD Admit Date: 01/11/2016  Reason for Consultation/Follow-up: Establishing goals of care and Psychosocial/spiritual support  Subjective:  This NP visited at the bedsdie of Michele Hale with her three sons.  She decompensated through the night was intubated.  All three are somber with minimal eye contact.  I spoke with [place], offered emotional support, questioning if this is want they expected out of aggressive medical intervetnions for the patient and if they had any questions or concerns. Family continues to remains hopeful for prolongation of life, family also have an  understanding of her serious illness and poor prognosis.  Family does not want to meet for further discussion at this time.    Family is open to all offered and available  medical interventions to prolong life.  Treat the treatable.    It is uncomprehensible to family that you would limit any intervention that might prolong life.   Length of Stay: 4 days  Current Medications: Scheduled Meds:  . acetylcysteine  4 mL Nebulization Once  . ampicillin-sulbactam (UNASYN) IV  3 g Intravenous Q8H  . antiseptic oral rinse  7 mL Mouth Rinse QID  . atropine  2 drop Sublingual QID  . chlorhexidine gluconate  15 mL Mouth Rinse BID  . dexamethasone  4 mg Oral TID  . enoxaparin (LOVENOX) injection  40 mg Subcutaneous Q24H  . feeding supplement (PRO-STAT SUGAR FREE 64)  30 mL Per Tube BID  . feeding supplement (VITAL AF 1.2 CAL)  1,000 mL Per Tube Q24H  . insulin aspart  0-9 Units Subcutaneous 6 times per day  .  ipratropium-albuterol  3 mL Nebulization Q6H  . pantoprazole sodium  40 mg Per Tube Daily  . sodium chloride flush  3 mL Intravenous Q12H    Continuous Infusions: . dextrose 5 % and 0.45% NaCl 125 mL/hr at 01/08/16 1800    PRN Meds: acetaminophen **OR** acetaminophen, bisacodyl, fentaNYL (SUBLIMAZE) injection, HYDROcodone-acetaminophen, LORazepam, morphine CONCENTRATE, ondansetron **OR** ondansetron (ZOFRAN) IV, polyethylene glycol  Physical Exam: Physical Exam  Constitutional: She appears cachectic. She appears ill. She is intubated.  Cardiovascular: Tachycardia present.   Pulmonary/Chest: She  is intubated. She has decreased breath sounds in the right lower field and the left lower field.  Neurological: She is unresponsive.  Skin: Skin is warm and dry.                Vital Signs: BP 141/66 mmHg  Pulse 89  Temp(Src) 100.1 F (37.8 C) (Oral)  Resp 15  Ht 5\' 5"  (1.651 m)  Wt 63 kg (138 lb 14.2 oz)  BMI 23.11 kg/m2  SpO2 100% SpO2: SpO2: 100 % O2 Device: O2 Device: Ventilator O2 Flow Rate: O2 Flow Rate (L/min): 2 L/min  Intake/output summary:   Intake/Output Summary (Last 24 hours) at 01/09/16 0527 Last data filed at 01/09/16 0300  Gross per 24 hour  Intake 2850.33 ml  Output   1275 ml  Net 1575.33 ml   LBM:   Baseline Weight: Weight: 63.504 kg (140 lb) Most recent weight: Weight: 63 kg (138 lb 14.2 oz)       Palliative Assessment/Data: Flowsheet Rows        Most Recent Value   Intake Tab    Referral Department  Hospitalist   Unit at Time of Referral  ICU   Palliative Care Primary Diagnosis  Cancer   Date Notified  12/27/2015   Palliative Care Type  New Palliative care   Reason for referral  Clarify Goals of Care, End of Life Care Assistance   Date of Admission  01/23/2016   Date first seen by Palliative Care  01/06/16   # of days Palliative referral response time  1 Day(s)   # of days IP prior to Palliative referral  0   Clinical Assessment    Psychosocial &  Spiritual Assessment    Palliative Care Outcomes       Additional Data Reviewed: CBC    Component Value Date/Time   WBC 2.5* 01/08/2016 0500   WBC 5.3 12/17/2015 0905   RBC 4.03 01/08/2016 0500   RBC 4.17 12/17/2015 0905   HGB 11.4* 01/08/2016 0500   HGB 11.6 12/17/2015 0905   HCT 35.5* 01/08/2016 0500   HCT 36.2 12/17/2015 0905   PLT 110* 01/08/2016 0500   PLT 190 12/17/2015 0905   MCV 88.1 01/08/2016 0500   MCV 86.8 12/17/2015 0905   MCH 28.3 01/08/2016 0500   MCH 27.8 12/17/2015 0905   MCHC 32.1 01/08/2016 0500   MCHC 32.0 12/17/2015 0905   RDW 15.4 01/08/2016 0500   RDW 16.2* 12/17/2015 0905   LYMPHSABS 0.5* 01/06/2016 0228   LYMPHSABS 0.9 12/17/2015 0905   MONOABS 0.1 01/06/2016 0228   MONOABS 0.3 12/17/2015 0905   EOSABS 0.0 01/06/2016 0228   EOSABS 0.0 12/17/2015 0905   BASOSABS 0.0 01/06/2016 0228   BASOSABS 0.0 12/17/2015 0905    CMP     Component Value Date/Time   NA 138 01/08/2016 0500   NA 141 12/17/2015 0905   K 4.3 01/08/2016 0500   K 4.5 12/17/2015 0905   CL 109 01/08/2016 0500   CL 104 01/23/2013 1420   CO2 24 01/08/2016 0500   CO2 28 12/17/2015 0905   GLUCOSE 108* 01/08/2016 0500   GLUCOSE 106 12/17/2015 0905   GLUCOSE 150* 01/23/2013 1420   BUN 19 01/08/2016 0500   BUN 9.3 12/17/2015 0905   CREATININE 0.37* 01/08/2016 0500   CREATININE 0.7 12/17/2015 0905   CALCIUM 6.8* 01/08/2016 0500   CALCIUM 9.7 12/17/2015 0905   PROT 6.8 01/06/2016 0228   PROT 7.5 12/17/2015 0905   ALBUMIN 3.6 01/06/2016  0228   ALBUMIN 3.5 12/17/2015 0905   AST 17 01/06/2016 0228   AST 14 12/17/2015 0905   ALT 19 01/06/2016 0228   ALT <9 12/17/2015 0905   ALKPHOS 80 01/06/2016 0228   ALKPHOS 104 12/17/2015 0905   BILITOT 0.8 01/06/2016 0228   BILITOT 0.31 12/17/2015 0905   GFRNONAA >60 01/08/2016 0500   GFRAA >60 01/08/2016 0500       Problem List:  Patient Active Problem List   Diagnosis Date Noted  . Aspiration pneumonia ([place]) 01/07/2016  . DNR  (do not resuscitate) discussion   . Palliative care encounter   . Respiratory failure ([place])   . Acute encephalopathy   . Cancer of left breast metastatic to brain ([place]) 01/21/2016  . Acute respiratory failure with hypoxia ([place]) 01/02/2016  . Malignant neoplasm metastatic to right lung ([place]) 01/02/2016  . Brain metastasis ([place]) 12/25/2015  . Dysphagia 11/26/2015  . Conjunctivitis 10/15/2015  . Tonsillitis 10/07/2015  . Lung metastases ([place]) 09/24/2015  . Chemotherapy-induced neuropathy ([place]) 09/24/2015  . Antineoplastic chemotherapy induced pancytopenia ([place]) 09/24/2015  . Liver metastases ([place]) 09/24/2015  . Mediastinal mass   . Nausea alone 05/20/2014  . Dysuria 05/20/2014  . Breast cancer of lower-outer quadrant of left female breast ([place]) 05/20/2014  . Estrogen deficiency 05/20/2014  . Right leg pain 01/02/2014  . Osteoporosis 04/11/2013  . Neck pain on left side 04/10/2013  . Allergic rhinitis 02/02/2013  . Osteoarthritis 02/02/2013  . Knee pain 02/02/2013  . Sciatica neuralgia 02/02/2013  . Lumbar radicular pain 02/02/2013     Palliative Care Assessment & Plan    1.Code Status:   Full code-     Code Status Orders        Start     Ordered   12/31/2015 2325  Full code   Continuous     01/14/2016 2327    Code Status History    Date Active Date Inactive Code Status Order ID Comments User Context   03/17/2015 10:23 AM 03/18/2015  3:28 AM Full Code [id number]  Arne Cleveland, MD [place]   03/04/2015 11:37 AM 03/05/2015  3:47 AM Full Code [id number]  Arne Cleveland, MD HOV       Total time spend on the unit was 20 minutes, greater than 50 % of the time was spent in counseling and coordination of care.  Care plan was discussed with Dr Tat  And Dr [place] Bells  Thank you for allowing the Palliative Medicine Team to assist in the care of this patient.   Time In:  0815 Time Out: 0835  Total Time 20 min  Prolonged Time Billed  no         [place] Royalty, NP  01/09/2016, 5:27  AM  Please contact Palliative Medicine Team phone at [phone] for questions and concerns.

## 2016-01-09 NOTE — Procedures (Signed)
Extubation Procedure Note  Patient Details:   Name: Lily Luis DOB: 1946-10-18 MRN: [id number]   Airway Documentation:     Evaluation  O2 sats: [id number] Complications: none Patient tolerated procedure well. Bilateral Breath Sounds: Rhonchi Suctioning: Airway Pt able to speak  Per CCM order, pt extubated.  Placed on 2L nasal cannula.  Other than large amount of oral secretions, no complications.  Martha Clan 01/09/2016, 11:51 AM

## 2016-01-09 NOTE — Progress Notes (Signed)
PULMONARY / CRITICAL CARE MEDICINE   Name: Arliene Tomek MRN: [id number] DOB: 02/19/1946    ADMISSION DATE:  01/04/2016 CONSULTATION DATE:  01/08/16  REFERRING MD:  [facility]/ Dr.David Tat  CHIEF COMPLAINT:  Respiratory Arrest  SUBJECTIVE:  No acute events Doing well this morning on SBT  VITAL SIGNS: BP 125/54 mmHg  Pulse 81  Temp(Src) 98.3 F (36.8 C) (Axillary)  Resp 16  Ht 5\' 5"  (1.651 m)  Wt 138 lb 14.2 oz (63 kg)  BMI 23.11 kg/m2  SpO2 100%  HEMODYNAMICS:    VENTILATOR SETTINGS: Vent Mode:  [-] PSV;CPAP FiO2 (%):  [30 %-100 %] 30 % Set Rate:  [12 bmp-20 bmp] 12 bmp Vt Set:  [460 mL] 460 mL PEEP:  [5 cmH20] 5 cmH20 Pressure Support:  [8 cmH20] 8 cmH20 Plateau Pressure:  [13 cmH20-17 cmH20] 13 cmH20  INTAKE / OUTPUT: I/O last 3 completed shifts: In: 3590.3 [I.V.:2333.3; NG/GT:457; IV [id number] Out: 1525 [id number]  PHYSICAL EXAMINATION: General: arouses on vent HENT: NCAT, ETT in place PULM: CTA B, vent supported breaths CV: RRR, no mgr GI: BS+, nontender MSK: normal bulk and tone Neuro: opens eyes to voice, doesn't follow commands  LABS:  BMET  Recent Labs Lab 01/07/16 0500 01/08/16 0500 01/09/16 0636  NA 139 138 141  K 4.1 4.3 4.0  CL 107 109 109  CO2 23 24 23   BUN 21* 19 20  CREATININE 0.37* 0.37* 0.33*  GLUCOSE 147* 108* 176*    Electrolytes  Recent Labs Lab 01/07/16 0500 01/08/16 0500 01/09/16 0636  CALCIUM 7.1* 6.8* 6.7*    CBC  Recent Labs Lab 01/07/16 0500 01/08/16 0500 01/09/16 0636  WBC 1.8* 2.5* 2.8*  HGB 11.0* 11.4* 9.8*  HCT 32.3* 35.5* 29.8*  PLT 112* 110* 85*    Coag's  Recent Labs Lab 01/06/16 0031 01/06/16 0228  APTT 25  --   INR  --  1.26    Sepsis Markers  Recent Labs Lab 01/08/2016 2253 01/06/16 0031 01/06/16 0228  LATICACIDVEN 1.85 1.3 1.2  PROCALCITON  --  <0.10  --     ABG  Recent Labs Lab 01/08/16 0040 01/08/16 0615 01/08/16 1132  PHART 7.366 6.970*  7.502*  PCO2ART 41.0 ABOVE REPORTABLE RANGE 31.1*  PO2ART 91.8 167* 384*    Liver Enzymes  Recent Labs Lab 12/31/2015 1810 01/06/16 0228 01/09/16 0636  AST 19 17 41  ALT 20 19 45  ALKPHOS 88 80 57  BILITOT 0.8 0.8 0.6  ALBUMIN 3.8 3.6 2.8*    Cardiac Enzymes No results for input(s): TROPONINI, PROBNP in the last 168 hours.  Glucose  Recent Labs Lab 01/06/16 0757 01/08/16 0114 01/08/16 2027 01/08/16 2349 01/09/16 0327 01/09/16 0733  GLUCAP 91 128* 203* 206* 200* 186*    Imaging Dg Chest Port 1 View  01/09/2016  CLINICAL DATA:  Respiratory distress, intubated EXAM: PORTABLE CHEST 1 VIEW COMPARISON:  01/08/2016 FINDINGS: Portable exam, rotated to the right. This distorts the mediastinum. Endotracheal tube is 5 cm above the carina. NG tube within the stomach. Stable right IJ power port catheter tip mid SVC. Minor basilar atelectasis on the left. Right lung remains clear. No developing pneumonia, collapse or consolidation. No enlarging effusion or significant pneumothorax. Atherosclerosis noted of the aorta. IMPRESSION: Overall stable portable chest exam with persistent left basilar atelectasis. Electronically Signed   By: Jerilynn Mages.  Shick M.D.   On: 01/09/2016 07:43   Dg Chest Port 1 View  01/08/2016  CLINICAL DATA:  STAT READ. CALL REPORT  TO [id number]. Status post ETT placement. Pt was re intubated this AM by anesthesia. Hx of respiratory distress. Hx of left breast cancer with brain mets. EXAM: PORTABLE CHEST 1 VIEW COMPARISON:  01/08/2016 FINDINGS: Endotracheal tube has been repositioned, tip now approximately 4.7 cm above the carina. Nasogastric tube is in place, tip coiled back upon itself in the region of the gastric fundus. A right-sided Port-A-Cath tip overlies the level of superior vena cava. Heart size is normal. Better lung inflation. There is persistent left perihilar density consistent with adenopathy. There is minimal left lower lobe atelectasis. Interval decompression of the  stomach. IMPRESSION: 1. Repositioned endotracheal tube. 2. Decompression of the stomach. 3. Left lower lobe atelectasis. 4. Mediastinal adenopathy. Electronically Signed   By: Nolon Nations M.D.   On: 01/08/2016 11:42     STUDIES:  2/20 CT Chest >> progression of metastatic disease, with interval increase in mediastinal lymphadenopathy, and new development of a large lytic lesion in the superior aspect of the right ilium.  2.9 x 2.2 cm mass in the central aspect of the left breast is similar to prior examinations. Irregularities in the manubrium and sternum (concern for metastatic disease) 3/01 MRI Brain >> At least 20 infratentorial and greater than 20 relatively small supratentorial intraparenchymal metastasis. Local mass effect without midline shift. 1 cm LEFT anterior cranial fossa possible dural based metastasis.   CULTURES: 3/13 Blood Cx >> 3/16 Sputum Culture >>  ANTIBIOTICS: 3/15  Unasyn>>>  SIGNIFICANT EVENTS: 3/13  Admission for Resp. Distress 3/16  Respiratory Arrest, Intubation/ transfer to ICU 3/17  Family conversation > DNR  LINES/TUBES: Right Port a cath  DISCUSSION: 70 y/o F with advanced metastatic breast cancer despite therapy who was admitted for confusion.  Prior to admit, she had 3 wk hx of worsening delirium & dysphagia.  She has been receiving whole brain radiation.  She has been advised for DNR but family refused and has been followed by hospice.  Unfortunately she suffered a respiratory arrest and was intubated 3/16. EEG negative for seizure activity.     ASSESSMENT / PLAN:  PULMONARY A:  Respiratory Arrest - 2/2 brain mets/aspiration pneumonitis Acute Respiratory Failure - secondary to above Dysphagia P:   Extubate today, do not re-intubate Use morphine as needed for dyspnea  Aspiration precautions  CARDIOVASCULAR A:  Sinus Rhythm Hypotension P:  Telemetry Monitoring NS > KVO   RENAL A:  No acute issues P:  Trend BMP / UOP Strict  I&O Replace electrolytes as indicated   GASTROINTESTINAL A:   ? Heme Positive Gastric Secretions - OG tube with dark / maroon drainage P:   PPI TF per protocol  NPO  HEMATOLOGIC A:   Pancytopenia 2/2/ marrow infiltration from metastatic cancer/chemo.  B12 1039,  P:  Transfuse for HGB <7 Continue Lovenox for now, monitor gastric secretions > could likely stop  INFECTIOUS A:   High Risk Aspiration - afebrile, secretions from ETT look gastric P:   ABX / Cultures as above Monitor fever curve / WBC PCT per algorithm  ENDOCRINE A: Hyperglycemia without DM, suspect 2/2 steroids   P:   CBG Q4 with SSI  NEUROLOGIC A:  Acute encephalopathy - suspect multifactorial with known brain mets, opiates etc.  EEG negative for seizure activity.   P:  PRN morphine for pain Continue Dexamethasone  PRN antivan    FAMILY  - Updates:  Family updated at bedside by me at length with three sons and her daughter.  I explained that she  has a disease that man cannot cure and that even though she is breathing comfortably this morning she will not be more awake due to the brain masses.  I explained that the best approach at this point is to extubate her and provide hospice care. If she becomes more dyspneic then use morphine for relief of dyspnea.  The family requests that we give her morphine via vein for comfort and not via mouth.  They do not want her to receive CPR or mechanical ventilation so code status changed to DNR.  I explained that I thought it best to keep her her for 48 hours after extubation to monitor for deterioration.  Would like palliative medicine to f/u with these details.  My cc time 60 minutes  Roselie Awkward, MD [place] [facility] Pager: [phone] Cell: [phone] After 3pm or if no response, call [phone]

## 2016-01-09 NOTE — Progress Notes (Signed)
Events from this morning noted including conversation with family by Dr. Lake Bells.  I stopped by to check on Michele Hale and her family this afternoon.     Two of her sons were present. They denied any needs and stated they really did not feel like talking or having visitors at this time.    I ensured that they have my contact information and will plan for follow-up tomorrow.  Micheline Rough, MD [facility] Team [phone]

## 2016-01-09 NOTE — Plan of Care (Signed)
Problem: Activity: Goal: Risk for activity intolerance will decrease Outcome: Not Progressing Patient too weak to get up at this time; Will perform passive ROM exercises.  Problem: Nutrition: Goal: Adequate nutrition will be maintained Outcome: Not Progressing Patient NPO at this time

## 2016-01-09 NOTE — Progress Notes (Signed)
[facility] 1235-[facility]-GIP RN Visit  This is a GIP related, covered admission from 01/06/16 to her [facility] diagnosis of Breast Cancer. This patient was admitted to hospice services 01/04/16. Visited patient in room.  Spoke with son who reported they were waiting to here the results of the EEG before making any decisions.  He states his sister in [place] has an appt with the consulate on Sunday 3/19.  It is unclear as to when she will arrive in the states.  The patient remains on full ventilator support at this time.   She has a continuous TF of Vital AF 1.2 .  The patient did open her eyes very briefly and was able to nod her head when son spoke to her.   Emotional support was offered during this difficult time to the son.  She has received six doses of Fentanyl 50 mcg in past 24 hours.      Per note from MD   Plan is to extubate the patient today and monitor .  She can receive Morphine for symptoms of dyspnea , family requests IV morphine versus oral.   Code status has now been changed to DNR.   [facility] will continue to follow this patient and continue to offer support as her needs change.  Please call with questions or concerns.   Mickie Kay, RN,  [facility] hospital liason  [phone]

## 2016-01-09 NOTE — Progress Notes (Signed)
[facility] MSW note: This is a hospice related admission. Pt remains a full code at this time. Pt opened her eyes today. Met with sons-Alhafivhafan, Bud, and Marinus Maw; daughter in law-Manhel; and Dr. Lake Bells for a family meeting. Dr. Lake Bells discussed pt's status. Pt's EEG showed no signs of seizures. Pt is breathing on her own and Dr. Lake Bells feels the ventilator can be removed today. Dr. Lake Bells discussed pt's continued poor status with family. Dr. Lake Bells expressed to family plan to care for patient with dignity and no intent to harm patient. Dr. Lake Bells educated family on DNR and ventilator in the future. Family wants to discuss among themselves and let staff RN or Dr. Lake Bells know if they agree with removing the ventilator. Family also to decide on DNR and if they desire ventilator in the future. MSW offered emotional support. MSW educated dil on [employer]. MSW updated Collie Siad, RN liaison and [place], International aid/development worker. Please call with questions.   Marilynne Halsted, MSW

## 2016-01-10 LAB — CULTURE, BLOOD (ROUTINE X 2)
CULTURE: NO GROWTH
CULTURE: NO GROWTH

## 2016-01-10 LAB — HEMOGLOBIN A1C
HEMOGLOBIN A1C: 6.6 % — AB (ref 4.8–5.6)
MEAN PLASMA GLUCOSE: 143 mg/dL

## 2016-01-10 LAB — CULTURE, RESPIRATORY W GRAM STAIN

## 2016-01-10 LAB — CULTURE, RESPIRATORY

## 2016-01-10 LAB — GLUCOSE, CAPILLARY
GLUCOSE-CAPILLARY: 111 mg/dL — AB (ref 65–99)
GLUCOSE-CAPILLARY: 128 mg/dL — AB (ref 65–99)
Glucose-Capillary: 84 mg/dL (ref 65–99)

## 2016-01-10 MED ORDER — MORPHINE SULFATE (PF) 4 MG/ML IV SOLN
INTRAVENOUS | Status: AC
  Filled 2016-01-10: qty 1

## 2016-01-10 MED ORDER — DEXAMETHASONE SODIUM PHOSPHATE 4 MG/ML IJ SOLN
4.0000 mg | Freq: Three times a day (TID) | INTRAMUSCULAR | Status: DC
  Administered 2016-01-10: 4 mg via INTRAVENOUS
  Filled 2016-01-10: qty 1

## 2016-01-10 MED ORDER — MORPHINE SULFATE (PF) 2 MG/ML IV SOLN
2.0000 mg | INTRAVENOUS | Status: DC | PRN
  Administered 2016-01-10: 4 mg via INTRAVENOUS

## 2016-01-10 MED ORDER — MORPHINE BOLUS VIA INFUSION
2.0000 mg | INTRAVENOUS | Status: DC | PRN
Start: 2016-01-10 — End: 2016-01-10
  Filled 2016-01-10: qty 4

## 2016-01-10 MED ORDER — SODIUM CHLORIDE 0.9 % IV SOLN
1.0000 mg/h | INTRAVENOUS | Status: DC
  Administered 2016-01-10: 1 mg/h via INTRAVENOUS
  Filled 2016-01-10: qty 10

## 2016-01-10 MED ORDER — PANTOPRAZOLE SODIUM 40 MG IV SOLR
40.0000 mg | INTRAVENOUS | Status: DC
  Administered 2016-01-10: 40 mg via INTRAVENOUS
  Filled 2016-01-10: qty 40

## 2016-01-11 LAB — URINE CULTURE: CULTURE: NO GROWTH

## 2016-01-12 ENCOUNTER — Ambulatory Visit: Payer: Medicaid Other

## 2016-01-12 ENCOUNTER — Telehealth: Payer: Self-pay | Admitting: *Deleted

## 2016-01-12 NOTE — Telephone Encounter (Signed)
Received call from [facility] stating that pt expired Jan 19, 2016 at [place].  Message sent to Dr Lorinda Creed.

## 2016-01-13 ENCOUNTER — Ambulatory Visit: Admitting: Radiation Oncology

## 2016-01-13 ENCOUNTER — Ambulatory Visit: Payer: Medicaid Other

## 2016-01-14 ENCOUNTER — Ambulatory Visit: Payer: Medicaid Other

## 2016-01-18 NOTE — Progress Notes (Signed)
  Radiation Oncology         [phone] ________________________________  Name: Michele Hale MRN: [id number]  Date: 01/06/2016  DOB: 1945/11/12  End of Treatment Note  ICD-9-CM ICD-10-CM     1. Brain metastasis ([place]) 198.3 C79.31     DIAGNOSIS: Metastatic breast cancer now with diagnosis of diffuse brain metastasis     Indication for treatment:  Brain metastasis       Radiation treatment dates:  01/01/2016-01/06/2016  Site/dose:   Whole brain, 12 gray in 4 fractions  Beams/energy:   Lateral fields  Narrative: The patient completed an abbreviated course of radiation therapy. Her overall clinical situation did not improve and the family elected to discontinue her treatment and proceed with hospice/palliative care  Plan:  When necessary follow-up in radiation oncology -----------------------------------  Blair Promise, PhD, MD

## 2016-01-21 ENCOUNTER — Encounter: Payer: Self-pay | Admitting: *Deleted

## 2016-01-21 ENCOUNTER — Ambulatory Visit: Payer: Medicaid Other

## 2016-01-24 NOTE — Progress Notes (Signed)
[facility] 1235-[facility] of [place]-[facility]-GIP RN Visit  This is a GIP related, covered admission from 01/06/16 to her [facility] diagnosis of Breast Cancer. This patient was admitted to hospice services 01/04/16. Patient is now a DNR.  Patient seen in room with son, Wendy Poet at bedside.  Patient's breathing is labored, shallow and she has a congested cough.  She is grimacing and does not respond to verbal stimuli. Son reports that her breathing tube was removed yesterday, and she is now very tired.  She is currently on 2 L [place] with O2 sats at 90%.  Patient has received 6 doses of 2 mg Morphine IV for dyspnea in the past 24 hours. She received a Duoneb this morning for shortness of breath.  Discussed initiating a Morphine gtt with the son, who stated he would think about it and discuss with the MD when he came by.  Offered emotional support.  [facility] will continue to follow.   Please call with any hospice-related questions or concerns and if patient expires.  Thank you, Freddi Starr RN, [facility] Liaison [phone]  Addendum:  A Morphine gtt was initiated at a rate of 1mg /ml/hr at 11:26 AM.

## 2016-01-24 NOTE — Progress Notes (Addendum)
PROGRESS NOTE  Michele Hale WTU:[id number] DOB: 11-28-1945 DOA: 01/06/2016 PCP: Angelica Chessman, MD  Brief History 70 y.o. female with PMH of left breast cancer, widely metastatic, now presenting to the emergency department with respiratory distress and depressed level of consciousness. Patient was diagnosed with breast cancer in 2012 and underwent neoadjuvant antiestrogen therapy followed by lumpectomy and radiation that year. She was diagnosed with metastatic disease in May 2016 and has known metastases to the liver, lung, and brain. She has been receiving palliative whole-brain radiation and home hospice services. Patient's condition has been rapidly declining. Her breathing has become more labored and she is less and less responsive. In ED, patient was found to be afebrile, requiring supplemental oxygen to maintain saturation in the 90s, and with vital signs otherwise stable. There are no acute findings on chest CT, with no suggestion of pneumonia, and no significant progression of metastatic lesions since the recent prior CT chest. Head CT is also negative for acute findings, but re-demonstrates the diffuse brain metastases. Since admission, the pt continued to be somnolent. Efforts to decrease patient's opioids were met with resistance from the family as they wanted patient's pain controlled. The patient exhibited difficulty handling her secretions; therefore, the patient was started on Unasyn for concerns of aspiration pneumonitis. Palliative medicine continued to meet with the family, but they continued to desire full scope of care despite the patient's very poor prognosis. On the early morning of 01/08/16, the patient suffered respiratory arrest and was intubated and transferred to ICU. The patient was subsequently extubated on 01/09/2016.  After discussion we'll critical care medicine, the patient was transitioned to DO NOT RESUSCITATE status. Palliative medicine continues to  remain in the conversation. Assessment/Plan: Acute respiratory failure with hypoxia -Secondary to underlying/ongoing aspiration secondary to her depressed mental status and hypoventilation from opioids/hypnotics -01/07/16--started unasyn -01/08/16--respiratory arrest-->intubated -01/09/16--extubated--changed level of care to DNR -Family has requested increasing intravenous morphine for pain control; however, they continue to ask why the patient remains somnolent and if anything can be done to help her clear her secretions and help her cough -Discussed transitioning the focus of care to that of full comfort, but family continues to struggle making this transition -palliative medicine remains involved in discussion  Acute metabolic encephalopathy -Multifactorial including known brain metastasis, possible steroids, opioid and hypnotic medications, and dehydration -Multiple physicians have explained to the patient's family that the patient will not be more weight due to her beta metastasis and other metabolic derangements  Metastatic breast cancer--bone, brain, lung -Appreciate Dr. Lindi Adie input--recommended hospice care -cancer has progressed on chemo -poor prognosis -palliative medicine remains involved in discussion  Pancytopenia -may be due to marrow infiltration from metastatic cancer/chemo -checked B12--1039  -RBC folate--1397 -hep c antibody, hep B surface antigen--NEG  Goals of Care -DNR -poor prognosis -appreciate palliative medicine followup    Family Communication: Son updated at beside Jan 30, 2016 Disposition Plan: remain in stepdown Time spent--35 min, >50% spent counseling and coordinating care    Procedures/Studies: Dg Chest 1 View  01/08/2016  CLINICAL DATA:  Intubation EXAM: CHEST 1 VIEW COMPARISON:  01/08/2016 FINDINGS: Endotracheal tube is low at the left bronchus. Recommend withdrawal of 4 cm. Port-A-Cath tip in the SVC. Hypoventilation with mild bibasilar  atelectasis. IMPRESSION: Endotracheal tube left main bronchus.  Recommend withdrawal 4 cm. These results were called by telephone at the time of interpretation on 01/08/2016 at 7:17 am to [facility], who verbally acknowledged these results. Electronically Signed  By: Franchot Gallo M.D.   On: 01/08/2016 07:17   Dg Chest 1 View  01/08/2016  CLINICAL DATA:  Short of breath.  Decreased oxygen saturation. EXAM: CHEST 1 VIEW COMPARISON:  01/18/2016 FINDINGS: Cardiac enlargement. Negative for heart failure. Negative for pneumonia. Anterior mediastinal mass is best noted on recent CT scan. Port-A-Cath tip in the SVC. IMPRESSION: Cardiac enlargement. No superimposed acute cardiopulmonary abnormality and no change from the prior study. Electronically Signed   By: Franchot Gallo M.D.   On: 01/08/2016 07:08   Ct Head Wo Contrast  01/01/2016  CLINICAL DATA:  Acute onset of altered mental status and apnea. Generalized weakness and confusion. Initial encounter. EXAM: CT HEAD WITHOUT CONTRAST TECHNIQUE: Contiguous axial images were obtained from the base of the skull through the vertex without intravenous contrast. COMPARISON:  MRI of the brain performed 12/24/2015, and CT of the head performed 02/21/2015 FINDINGS: Scattered areas of hypoattenuation reflect the patient's known diffuse brain metastases. These are better characterized on recent prior MRI. There is no evidence of hemorrhagic transformation. No acute infarct is seen. Scattered periventricular white matter change likely reflects small vessel ischemic microangiopathy. Mild cortical volume loss is noted. Mild cerebellar atrophy is seen. No midline shift is seen. There is no evidence of fracture; visualized osseous structures are unremarkable in appearance. The orbits are within normal limits. The paranasal sinuses and mastoid air cells are well-aerated. No significant soft tissue abnormalities are seen. IMPRESSION: 1. Scattered areas of hypoattenuation reflect the  patient's known diffuse brain metastases. These are better characterized on recent prior MRI. 2. Scattered small vessel ischemic microangiopathy and mild cortical volume loss. Electronically Signed   By: Garald Balding M.D.   On: 01/02/2016 21:52   Ct Soft Tissue Neck W Contrast  12/15/2015  CLINICAL DATA:  Left metastatic breast cancer diagnosed in 2011. Ongoing chemotherapy. Difficulty swallowing for 2 weeks. EXAM: CT NECK WITH CONTRAST TECHNIQUE: Multidetector CT imaging of the neck was performed using the standard protocol following the bolus administration of intravenous contrast. CONTRAST:  59m OMNIPAQUE IOHEXOL 300 MG/ML SOLN, 1037mOMNIPAQUE IOHEXOL 300 MG/ML SOLN COMPARISON:  PET-CT 02/28/2015 FINDINGS: Pharynx and larynx: Asymmetric larynx. The left arytenoid is medialized. No evidence of pharyngeal based mass lesion. Salivary glands: Negative Thyroid: Numerous small bilateral nodules, considered incidental. Lymph nodes: Enlarged nodes at diagnosis have significantly decreased in size. No enlarged necrotic appearing lymph nodes in the neck. No mass displacing the cervical esophagus. Vascular: Atherosclerosis without evidence of flow limiting stenosis. Limited intracranial: 6 mm focus of enhancement along the floor of the fourth ventricle, left of midline. No associated edema or spur thick ventricular effacement. Visualized orbits: Negative Mastoids and visualized paranasal sinuses: Clear Skeleton: No acute finding or aggressive lesion. Multilevel advanced facet arthropathy Upper chest: Reported separately These results will be called to the ordering clinician or representative by the Radiologist Assistant, and communication documented in the PACS or zVision Dashboard. IMPRESSION: 1. Questionable 6 mm mass in the left pons. Recommend brain MRI to evaluate for metastatic disease. 2. Asymmetric larynx, possible cord paresis on the left. 3. Supraclavicular/low cervical metastatic adenopathy seen in 2016  has improved. Currently no enlarged cervical lymph nodes. 4. Chest reported separately. Electronically Signed   By: JoMonte Fantasia.D.   On: 12/15/2015 10:28   Ct Chest W Contrast  12/15/2015  CLINICAL DATA:  [age]ear old female with history of left-sided breast cancer diagnosed in 2011 with none metastatic disease. Ongoing chemotherapy. Difficulty swallowing for the past 2 weeks. EXAM:  CT CHEST, ABDOMEN, AND PELVIS WITH CONTRAST TECHNIQUE: Multidetector CT imaging of the chest, abdomen and pelvis was performed following the standard protocol during bolus administration of intravenous contrast. CONTRAST:  27m OMNIPAQUE IOHEXOL 300 MG/ML SOLN, 1051mOMNIPAQUE IOHEXOL 300 MG/ML SOLN COMPARISON:  CT the chest, abdomen and pelvis 09/22/2015. PET-CT 07/18/2015. FINDINGS: CT CHEST FINDINGS Mediastinum/Lymph Nodes: Further increase in metastatic lymphadenopathy throughout the mediastinum, most evident in the anterior mediastinum where the largest enhancing nodal mass measures up to 3.5 x 6.1 cm (image 18 of series 2), previously only 2.7 x 4.6 cm on 09/22/2015. This lesion is contiguous with the left internal mammary nodal chain, which also demonstrates some enlarged lymph nodes. Marked enlargement of a low right paratracheal lymph node which currently measures 1.7 cm in short axis (previously nonenlarged). Heart size is borderline enlarged. There is no significant pericardial fluid, thickening or pericardial calcification. There is atherosclerosis of the thoracic aorta, the great vessels of the mediastinum and the coronary arteries, including calcified atherosclerotic plaque in the left main, left anterior descending and right coronary arteries. Mild calcifications of the mitral annulus and aortic valve. Esophagus is unremarkable in appearance. No axillary lymphadenopathy. Right-sided internal jugular single-lumen porta cath with tip terminating at the superior cavoatrial junction. Lungs/Pleura: No suspicious  appearing pulmonary nodules or masses. No acute consolidative airspace disease. No pleural effusions. Musculoskeletal/Soft Tissues: Soft tissue mass in the central aspect of the left breast measuring up to 2.9 x 2.2 cm (image 35 of series 2), similar to prior examination. Some overlying skin thickening is noted. Irregular sclerosis and lucency is again noted in the manubrium, similar to the prior examination. There is also a small lucent lesion in the left side of the sternum measuring 7 mm (image 23 of series 7) which is similar to prior examinations. CT ABDOMEN AND PELVIS FINDINGS Hepatobiliary: No suspicious cystic or solid hepatic lesions. No intra or extrahepatic biliary ductal dilatation. Gallbladder is normal in appearance. Pancreas: No pancreatic mass. No pancreatic ductal dilatation. No pancreatic or peripancreatic fluid or inflammatory changes. Spleen: Unremarkable. Adrenals/Urinary Tract: Several sub cm low-attenuation lesions are noted in the kidneys bilaterally, similar to prior examinations. Although these are too small to definitively characterize, these are presumably small cysts. Bilateral adrenal glands are normal in appearance. No hydroureteronephrosis. Urinary bladder is nearly decompressed, but otherwise unremarkable in appearance. Stomach/Bowel: Normal appearance of the stomach. No pathologic dilatation of small bowel or colon. Normal appendix. Vascular/Lymphatic: Atherosclerosis throughout the abdominal and pelvic vasculature, without evidence of aneurysm or dissection. No lymphadenopathy noted in the abdomen or pelvis. Reproductive: Uterus and ovaries are atrophic. Other: No significant volume of ascites.  No pneumoperitoneum. Musculoskeletal: New osteolysis involving the superior aspect of the right ilium with some extension of tumor into the overlying right gluteal and iliacus musculature. 12 mm of anterolisthesis of L5 upon S1. Bilateral pars defects at L5. IMPRESSION: 1. Today's study  demonstrates progression of metastatic disease, with interval increase in mediastinal lymphadenopathy, and new development of a large lytic lesion in the superior aspect of the right ilium. 2. 2.9 x 2.2 cm mass in the central aspect of the left breast is similar to prior examinations. 3. Irregularities in the manubrium and sternum, similar to prior examinations, potentially either treatment related or treated metastatic disease. 4. Additional incidental findings, similar to prior examinations, as above. Electronically Signed   By: DaVinnie Langton.D.   On: 12/15/2015 10:37   Ct Angio Chest Pe W/cm &/or Wo Cm  01/03/2016  CLINICAL  DATA:  Difficulty breathing the past 3 days. Coughing with sputum. Admitted yesterday to hospice. Increased weakness with shaking. EXAM: CT ANGIOGRAPHY CHEST WITH CONTRAST TECHNIQUE: Multidetector CT imaging of the chest was performed using the standard protocol during bolus administration of intravenous contrast. Multiplanar CT image reconstructions and MIPs were obtained to evaluate the vascular anatomy. CONTRAST:  134m OMNIPAQUE IOHEXOL 350 MG/ML SOLN COMPARISON:  Chest x-ray from earlier same day and chest CT dated 12/15/2015. FINDINGS: Anterior mediastinal mass measuring 5.6 x 3.4 cm, stable compared to the most recent chest CT. By report, this measured 4.6 x 2.7 cm on 09/22/2015. Lower right paratracheal lymph node is stable measuring 1.9 cm short axis dimension. Additional nodular mass within the aorta pulmonary window region is stable at 1.4 cm short axis dimension. No new masses or enlarged lymph nodes within the mediastinum or perihilar regions. The majority of the peripheral segmental and subsegmental pulmonary arteries cannot be definitively characterized due to patient breathing motion artifact. There is no central obstructing pulmonary embolism identified within the main, lobar or central segmental pulmonary arteries. Thoracic aorta is normal in caliber and  configuration. No aortic aneurysm or dissection. Heart size is upper normal. No pericardial effusion. Lungs are clear. No evidence of pneumonia. No pleural effusion. No pneumothorax. Trachea and central bronchi are unremarkable. Limited images of the upper abdomen are unremarkable. Left breast mass is stable in appearance, measuring approximately 2.8 cm greatest dimension, compatible with patient's history of breast cancer and/or postsurgical change. No acute osseous abnormality appreciated. Degenerative changes again noted within the thoracic spine. Irregular sclerosis and lucency within the sternum is stable and may be related to post treatment change. Review of the MIP images confirms the above findings. IMPRESSION: 1. Stable exam. The mediastinal masses and lymphadenopathy are stable compared to the most recent chest CT of 12/15/2015, although progression was noted in the last CT report when compared to an earlier CT of 09/22/2015. 2. No acute findings. No pulmonary embolism seen, with study limitations detailed above. Lungs are clear. Electronically Signed   By: SFranki CabotM.D.   On: 01/11/2016 22:00   Mr BJeri CosWDDContrast  12/24/2015  CLINICAL DATA:  Altered mental status. Noncommunicating. History of breast cancer, assess for metastasis. EXAM: MRI HEAD WITHOUT AND WITH CONTRAST TECHNIQUE: Multiplanar, multiecho pulse sequences of the brain and surrounding structures were obtained without and with intravenous contrast. CONTRAST:  176mMULTIHANCE GADOBENATE DIMEGLUMINE 529 MG/ML IV SOLN COMPARISON:  CT neck December 23, 2015 FINDINGS: At least 20 infratentorial and greater than 20 supratentorial enhancing parenchymal masses, including 12 mm mass prior pontomedullary junction, multiple subcentimeter masses in the bilateral thalamus, bilateral basal ganglia and, at gray-white matter junction, extending into the deep white matter. 1 cm mass in LEFT anterior cranial fossa and may be extra-axial, sagittal  19/24. Largest supratentorial mass in LEFT frontal lobe measuring up to 18 mm. 11 mm mass RIGHT parasagittal splenium of corpus callosum. Masses demonstrate reduced diffusion, in T2 shine through. No susceptibility artifact to suggest hemorrhage. Masses demonstrate bright FLAIR T2 hyperintense vasogenic edema without significant mass effect. No midline shift. No reduced diffusion to suggest acute ischemia. Ventricles and sulci are generally normal for patient's age. No abnormal extra-axial fluid collections. No extra-axial masses nor leptomeningeal enhancement. Normal major intracranial vascular flow voids seen at the skull base. Ocular globes and orbital contents are unremarkable though not tailored for evaluation. No suspicious calvarial bone marrow signal. No abnormal sellar expansion. Craniocervical junction maintained. Visualized paranasal sinuses  and mastoid air cells are well-aerated. IMPRESSION: At least 20 infratentorial and greater than 20 relatively small supratentorial intraparenchymal metastasis. Local mass effect without midline shift. 1 cm LEFT anterior cranial fossa possible dural based metastasis. Electronically Signed   By: Elon Alas M.D.   On: 12/24/2015 22:27   Ct Abdomen Pelvis W Contrast  12/15/2015  CLINICAL DATA:  70 year old female with history of left-sided breast cancer diagnosed in 2011 with none metastatic disease. Ongoing chemotherapy. Difficulty swallowing for the past 2 weeks. EXAM: CT CHEST, ABDOMEN, AND PELVIS WITH CONTRAST TECHNIQUE: Multidetector CT imaging of the chest, abdomen and pelvis was performed following the standard protocol during bolus administration of intravenous contrast. CONTRAST:  3m OMNIPAQUE IOHEXOL 300 MG/ML SOLN, 1056mOMNIPAQUE IOHEXOL 300 MG/ML SOLN COMPARISON:  CT the chest, abdomen and pelvis 09/22/2015. PET-CT 07/18/2015. FINDINGS: CT CHEST FINDINGS Mediastinum/Lymph Nodes: Further increase in metastatic lymphadenopathy throughout the  mediastinum, most evident in the anterior mediastinum where the largest enhancing nodal mass measures up to 3.5 x 6.1 cm (image 18 of series 2), previously only 2.7 x 4.6 cm on 09/22/2015. This lesion is contiguous with the left internal mammary nodal chain, which also demonstrates some enlarged lymph nodes. Marked enlargement of a low right paratracheal lymph node which currently measures 1.7 cm in short axis (previously nonenlarged). Heart size is borderline enlarged. There is no significant pericardial fluid, thickening or pericardial calcification. There is atherosclerosis of the thoracic aorta, the great vessels of the mediastinum and the coronary arteries, including calcified atherosclerotic plaque in the left main, left anterior descending and right coronary arteries. Mild calcifications of the mitral annulus and aortic valve. Esophagus is unremarkable in appearance. No axillary lymphadenopathy. Right-sided internal jugular single-lumen porta cath with tip terminating at the superior cavoatrial junction. Lungs/Pleura: No suspicious appearing pulmonary nodules or masses. No acute consolidative airspace disease. No pleural effusions. Musculoskeletal/Soft Tissues: Soft tissue mass in the central aspect of the left breast measuring up to 2.9 x 2.2 cm (image 35 of series 2), similar to prior examination. Some overlying skin thickening is noted. Irregular sclerosis and lucency is again noted in the manubrium, similar to the prior examination. There is also a small lucent lesion in the left side of the sternum measuring 7 mm (image 23 of series 7) which is similar to prior examinations. CT ABDOMEN AND PELVIS FINDINGS Hepatobiliary: No suspicious cystic or solid hepatic lesions. No intra or extrahepatic biliary ductal dilatation. Gallbladder is normal in appearance. Pancreas: No pancreatic mass. No pancreatic ductal dilatation. No pancreatic or peripancreatic fluid or inflammatory changes. Spleen: Unremarkable.  Adrenals/Urinary Tract: Several sub cm low-attenuation lesions are noted in the kidneys bilaterally, similar to prior examinations. Although these are too small to definitively characterize, these are presumably small cysts. Bilateral adrenal glands are normal in appearance. No hydroureteronephrosis. Urinary bladder is nearly decompressed, but otherwise unremarkable in appearance. Stomach/Bowel: Normal appearance of the stomach. No pathologic dilatation of small bowel or colon. Normal appendix. Vascular/Lymphatic: Atherosclerosis throughout the abdominal and pelvic vasculature, without evidence of aneurysm or dissection. No lymphadenopathy noted in the abdomen or pelvis. Reproductive: Uterus and ovaries are atrophic. Other: No significant volume of ascites.  No pneumoperitoneum. Musculoskeletal: New osteolysis involving the superior aspect of the right ilium with some extension of tumor into the overlying right gluteal and iliacus musculature. 12 mm of anterolisthesis of L5 upon S1. Bilateral pars defects at L5. IMPRESSION: 1. Today's study demonstrates progression of metastatic disease, with interval increase in mediastinal lymphadenopathy, and new development of  a large lytic lesion in the superior aspect of the right ilium. 2. 2.9 x 2.2 cm mass in the central aspect of the left breast is similar to prior examinations. 3. Irregularities in the manubrium and sternum, similar to prior examinations, potentially either treatment related or treated metastatic disease. 4. Additional incidental findings, similar to prior examinations, as above. Electronically Signed   By: Vinnie Langton M.D.   On: 12/15/2015 10:37   Dg Esophagus  12/15/2015  CLINICAL DATA:  Dysphagia with solids. History of breast cancer status post lumpectomy and radiation. EXAM: ESOPHOGRAM / BARIUM SWALLOW / BARIUM TABLET STUDY TECHNIQUE: Combined double contrast and single contrast examination performed using effervescent crystals, thick barium  liquid, and thin barium liquid. The patient was observed with fluoroscopy swallowing a 13 mm barium sulphate tablet. FLUOROSCOPY TIME:  Radiation Exposure Index (as provided by the fluoroscopic device): 24.62 d Gy cm2 COMPARISON:  None. FINDINGS: Normal esophageal peristalsis. No fixed esophageal narrowing or stricture. A 13 mm barium tablet passed into the stomach without delay. Small hiatal hernia. Patient could not tolerate prone swallows. Gastroesophageal reflux could not be assessed. IMPRESSION: No fixed esophageal narrowing or stricture. Small hiatal hernia. Electronically Signed   By: Julian Hy M.D.   On: 12/15/2015 12:33   Dg Chest Port 1 View  01/09/2016  CLINICAL DATA:  Respiratory distress, intubated EXAM: PORTABLE CHEST 1 VIEW COMPARISON:  01/08/2016 FINDINGS: Portable exam, rotated to the right. This distorts the mediastinum. Endotracheal tube is 5 cm above the carina. NG tube within the stomach. Stable right IJ power port catheter tip mid SVC. Minor basilar atelectasis on the left. Right lung remains clear. No developing pneumonia, collapse or consolidation. No enlarging effusion or significant pneumothorax. Atherosclerosis noted of the aorta. IMPRESSION: Overall stable portable chest exam with persistent left basilar atelectasis. Electronically Signed   By: Jerilynn Mages.  Shick M.D.   On: 01/09/2016 07:43   Dg Chest Port 1 View  01/08/2016  CLINICAL DATA:  STAT READ. CALL REPORT TO [id number]. Status post ETT placement. Pt was re intubated this AM by anesthesia. Hx of respiratory distress. Hx of left breast cancer with brain mets. EXAM: PORTABLE CHEST 1 VIEW COMPARISON:  01/08/2016 FINDINGS: Endotracheal tube has been repositioned, tip now approximately 4.7 cm above the carina. Nasogastric tube is in place, tip coiled back upon itself in the region of the gastric fundus. A right-sided Port-A-Cath tip overlies the level of superior vena cava. Heart size is normal. Better lung inflation. There is  persistent left perihilar density consistent with adenopathy. There is minimal left lower lobe atelectasis. Interval decompression of the stomach. IMPRESSION: 1. Repositioned endotracheal tube. 2. Decompression of the stomach. 3. Left lower lobe atelectasis. 4. Mediastinal adenopathy. Electronically Signed   By: Nolon Nations M.D.   On: 01/08/2016 11:42   Dg Chest Port 1 View  01/19/2016  CLINICAL DATA:  Increased weakness, worsened breathing, increased sputum. History of left breast cancer with brain metastases. EXAM: PORTABLE CHEST 1 VIEW COMPARISON:  Chest x-ray dated 02/21/2015. Comparison also made to chest CT of 12/15/2015. FINDINGS: Cardiomediastinal silhouette appears stable in size and configuration. Some fullness at the left hilum is likely related to the metastatic lymphadenopathy identified within the mediastinum on earlier chest CT. Right chest wall Port-A-Cath now in place with tip well-positioned at the level of the lower SVC. Lungs are clear. No pleural effusion. No pneumothorax. Osseous structures about the chest are unremarkable. IMPRESSION: Fullness in the left hilum likely related to the metastatic lymphadenopathy  identified within the mediastinum on earlier chest CT. Lungs are clear and there is no evidence of acute cardiopulmonary abnormality. No evidence of pneumonia. Electronically Signed   By: Franki Cabot M.D.   On: 01/09/2016 18:45   Dg Abd Portable 1v  01/08/2016  CLINICAL DATA:  Check gastric catheter placement EXAM: PORTABLE ABDOMEN - 1 VIEW COMPARISON:  None. FINDINGS: A gastric catheter is now seen coiled within the stomach. Scattered large and small bowel gas is noted. Changes are noted at L1, L2 and L3 E consistent with degenerative change although some suggestion of vertebral body height loss is noted. This may be projectional in nature. The need for further evaluation can be determined on a clinical basis. IMPRESSION: Gastric catheter within the stomach. Degenerative  changes of the lumbar spine and suggestion of vertebral body height loss as described. Electronically Signed   By: Inez Catalina M.D.   On: 01/08/2016 08:54         Subjective: Patient is awake but minimally responsive to Protopic stimuli. Having difficulty clearing her secretions. No respiratory distress. No reports of vomiting, diarrhea, respiratory distress. Unable to obtain review of systems  Objective: Filed Vitals:   01-13-2016 0400 Jan 13, 2016 0500 2016-01-13 0600 13-Jan-2016 0626  BP: 149/65 143/65 158/62   Pulse: 66 60 61   Temp: 98.6 F (37 C)     TempSrc: Axillary     Resp: 18 18 19    Height:      Weight:      SpO2: 100% 100% 100% 100%    Intake/Output Summary (Last 24 hours) at 2016-01-13 0832 Last data filed at January 13, 2016 7619  Gross per 24 hour  Intake   3266 ml  Output   1915 ml  Net   1351 ml   Weight change:  Exam:   General:  Pt is Awake, but does not follow commands not in acute distress  HEENT: No icterus, No thrush, No neck mass, [place]/AT; no meningismus  Cardiovascular: RRR, S1/S2, no rubs, no gallops  Respiratory: Bilateral scattered rales  Abdomen: Soft/+BS, non tender, non distended, no guarding  Extremities: No edema, No lymphangitis, No petechiae, No rashes, no synovitis; no clubbing or cyanosis  Data Reviewed: Basic Metabolic Panel:  Recent Labs Lab 01/07/2016 1810 01/06/16 0228 01/07/16 0500 01/08/16 0500 01/09/16 0636  NA 135 138 139 138 141  K 4.2 3.9 4.1 4.3 4.0  CL 103 104 107 109 109  CO2 24 25 23 24 23   GLUCOSE 176* 135* 147* 108* 176*  BUN 24* 18 21* 19 20  CREATININE 0.44 0.37* 0.37* 0.37* 0.33*  CALCIUM 8.0* 7.7* 7.1* 6.8* 6.7*   Liver Function Tests:  Recent Labs Lab 12/30/2015 1810 01/06/16 0228 01/09/16 0636  AST 19 17 41  ALT 20 19 45  ALKPHOS 88 80 57  BILITOT 0.8 0.8 0.6  PROT 7.4 6.8 5.7*  ALBUMIN 3.8 3.6 2.8*   No results for input(s): LIPASE, AMYLASE in the last 168 hours.  Recent Labs Lab 01/17/2016 2159    AMMONIA 58*   CBC:  Recent Labs Lab 12/29/2015 1810 01/06/16 0228 01/07/16 0500 01/08/16 0500 01/09/16 0636  WBC 2.5* 1.7* 1.8* 2.5* 2.8*  NEUTROABS 2.2 1.2*  --   --   --   HGB 12.3 11.8* 11.0* 11.4* 9.8*  HCT 35.6* 35.8* 32.3* 35.5*  34.7 29.8*  MCV 82.6 86.7 83.2 88.1 88.4  PLT 129* 112* 112* 110* 85*   Cardiac Enzymes: No results for input(s): CKTOTAL, CKMB, CKMBINDEX, TROPONINI in  the last 168 hours. BNP: Invalid input(s): POCBNP CBG:  Recent Labs Lab 01/09/16 1142 01/09/16 1648 01/09/16 2024 Feb 03, 2016 0049 02/03/2016 0424  GLUCAP 169* 157* 125* 128* 111*    Recent Results (from the past 240 hour(s))  Blood Culture (routine x 2)     Status: None (Preliminary result)   Collection Time: 01/12/2016  6:00 PM  Result Value Ref Range Status   Specimen Description BLOOD RIGHT ARM  Final   Special Requests BOTTLES DRAWN AEROBIC AND ANAEROBIC 5CC  Final   Culture   Final    NO GROWTH 4 DAYS Performed at [facility]    Report Status PENDING  Incomplete  Blood Culture (routine x 2)     Status: None (Preliminary result)   Collection Time: 01/04/2016  6:00 PM  Result Value Ref Range Status   Specimen Description BLOOD LEFT ARM  Final   Special Requests BOTTLES DRAWN AEROBIC AND ANAEROBIC 5CC  Final   Culture   Final    NO GROWTH 4 DAYS Performed at [facility]    Report Status PENDING  Incomplete  Urine culture     Status: None   Collection Time: 01/06/2016 10:38 PM  Result Value Ref Range Status   Specimen Description URINE, CLEAN CATCH  Final   Special Requests NONE  Final   Culture   Final    MULTIPLE SPECIES PRESENT, SUGGEST RECOLLECTION Performed at [facility]    Report Status 01/07/2016 FINAL  Final  MRSA PCR Screening     Status: None   Collection Time: 01/06/16  2:38 AM  Result Value Ref Range Status   MRSA by PCR NEGATIVE NEGATIVE Final    Comment:        The GeneXpert MRSA Assay (FDA approved for NASAL specimens only), is one  component of a comprehensive MRSA colonization surveillance program. It is not intended to diagnose MRSA infection nor to guide or monitor treatment for MRSA infections.   Culture, respiratory (NON-Expectorated)     Status: None (Preliminary result)   Collection Time: 01/08/16 11:33 AM  Result Value Ref Range Status   Specimen Description TRACHEAL ASPIRATE  Final   Special Requests Immunocompromised  Final   Gram Stain   Final    FEW WBC PRESENT, PREDOMINANTLY PMN FEW SQUAMOUS EPITHELIAL CELLS PRESENT MODERATE YEAST Performed at [facility]    Culture PENDING  Incomplete   Report Status PENDING  Incomplete     Scheduled Meds: . acetylcysteine  4 mL Nebulization Once  . ampicillin-sulbactam (UNASYN) IV  3 g Intravenous Q8H  . antiseptic oral rinse  7 mL Mouth Rinse q12n4p  . atropine  2 drop Sublingual QID  . chlorhexidine  15 mL Mouth Rinse BID  . dexamethasone  4 mg Oral TID  . enoxaparin (LOVENOX) injection  40 mg Subcutaneous Q24H  . feeding supplement (PRO-STAT SUGAR FREE 64)  30 mL Per Tube BID  . feeding supplement (VITAL AF 1.2 CAL)  1,000 mL Per Tube Q24H  . insulin aspart  0-9 Units Subcutaneous 6 times per day  . pantoprazole sodium  40 mg Per Tube Daily  . sodium chloride flush  3 mL Intravenous Q12H   Continuous Infusions: . sodium chloride 125 mL/hr at 02-03-2016 0325     Tavoris Brisk, DO  [facility] Pager [phone]  If 7PM-7AM, please contact night-coverage [email or web address] Password [email or web address] 02/03/2016, 8:32 AM   LOS: 5 days

## 2016-01-24 NOTE — Discharge Summary (Signed)
Death Summary  Michele Hale JXB:[id number] DOB: [birth year] DOA: 2016/01/14  PCP: Angelica Chessman, MD  Admit date: January 14, 2016 Date of Death: 2016/01/19 at 12/30/15  Final Diagnoses:  Acute respiratory failure with hypoxia -Secondary to underlying/ongoing aspiration secondary to her depressed mental status and hypoventilation from opioids/hypnotics -01/07/16--started unasyn -01/08/16--respiratory arrest-->intubated -01/09/16--extubated--changed level of care to DNR -Family has requested increasing intravenous morphine for pain control; however, they continue to ask why the patient remains somnolent and if anything can be done to help her clear her secretions and help her cough -Discussed transitioning the focus of care to that of full comfort, but family continues to struggle making this transition -palliative medicine remains involved in discussion -Family had another discussion with palliative medicine and they agreed to transition the patient to morphine continuous infusion with the understanding of the risks involved  -ultimately, the patient died without any further distress   Acute metabolic encephalopathy -Multifactorial including known brain metastasis, possible steroids, opioid and hypnotic medications, and dehydration -Multiple physicians have explained to the patient's family that the patient will not be more awake due to her brain metastasis and other metabolic derangements  Metastatic breast cancer--bone, brain, lung -Appreciate Dr. Lindi Adie input--recommended hospice care -cancer has progressed on chemo -poor prognosis -palliative medicine remains involved in discussion  Pancytopenia -may be due to marrow infiltration from metastatic cancer/chemo -checked B12--1039  -RBC folate--1397 -hep c antibody, hep B surface antigen--NEG  History of present illness:  70 y.o. female with PMH of left breast cancer, widely metastatic, now presenting to the emergency department with  respiratory distress and depressed level of consciousness. Patient was diagnosed with breast cancer in 12-29-2010 and underwent neoadjuvant antiestrogen therapy followed by lumpectomy and radiation that year. She was diagnosed with metastatic disease in May 2016 and has known metastases to the liver, lung, and brain. She has been receiving palliative whole-brain radiation and home hospice services. Patient's condition has been rapidly declining. Her breathing has become more labored and she is less and less responsive. In ED, patient was found to be afebrile, requiring supplemental oxygen to maintain saturation in the 90s, and with vital signs otherwise stable. There are no acute findings on chest CT, with no suggestion of pneumonia, and no significant progression of metastatic lesions since the recent prior CT chest. Head CT is also negative for acute findings, but re-demonstrates the diffuse brain metastases. Since admission, the pt continued to be somnolent. Efforts to decrease patient's opioids were met with resistance from the family as they wanted patient's pain controlled. The patient exhibited difficulty handling her secretions; therefore, the patient was started on Unasyn for concerns of aspiration pneumonitis. Palliative medicine continued to meet with the family, but they continued to desire full scope of care despite the patient's very poor prognosis. On the early morning of 01/08/16, the patient suffered respiratory arrest and was intubated and transferred to ICU. The patient was subsequently extubated on 01/09/2016. After discussion with critical care medicine, the patient was transitioned to DO NOT RESUSCITATE status. Palliative medicine continues to remain in the conversation. The family had another conversation with palliative medicine and transitioned the patient to morphine continuous infusion with the understanding of the risks involved. Ultimately, the patient died peacefully without distress on  the morphine drip.     The results of significant diagnostics from this hospitalization (including imaging, microbiology, ancillary and laboratory) are listed below for reference.    Significant Diagnostic Studies: Dg Chest 1 View  01/08/2016  CLINICAL DATA:  Intubation EXAM: CHEST  1 VIEW COMPARISON:  01/08/2016 FINDINGS: Endotracheal tube is low at the left bronchus. Recommend withdrawal of 4 cm. Port-A-Cath tip in the SVC. Hypoventilation with mild bibasilar atelectasis. IMPRESSION: Endotracheal tube left main bronchus.  Recommend withdrawal 4 cm. These results were called by telephone at the time of interpretation on 01/08/2016 at 7:17 am to [facility], who verbally acknowledged these results. Electronically Signed   By: Franchot Gallo M.D.   On: 01/08/2016 07:17   Dg Chest 1 View  01/08/2016  CLINICAL DATA:  Short of breath.  Decreased oxygen saturation. EXAM: CHEST 1 VIEW COMPARISON:  12/30/2015 FINDINGS: Cardiac enlargement. Negative for heart failure. Negative for pneumonia. Anterior mediastinal mass is best noted on recent CT scan. Port-A-Cath tip in the SVC. IMPRESSION: Cardiac enlargement. No superimposed acute cardiopulmonary abnormality and no change from the prior study. Electronically Signed   By: Franchot Gallo M.D.   On: 01/08/2016 07:08   Ct Head Wo Contrast  12/24/2015  CLINICAL DATA:  Acute onset of altered mental status and apnea. Generalized weakness and confusion. Initial encounter. EXAM: CT HEAD WITHOUT CONTRAST TECHNIQUE: Contiguous axial images were obtained from the base of the skull through the vertex without intravenous contrast. COMPARISON:  MRI of the brain performed 12/24/2015, and CT of the head performed 02/21/2015 FINDINGS: Scattered areas of hypoattenuation reflect the patient's known diffuse brain metastases. These are better characterized on recent prior MRI. There is no evidence of hemorrhagic transformation. No acute infarct is seen. Scattered periventricular  white matter change likely reflects small vessel ischemic microangiopathy. Mild cortical volume loss is noted. Mild cerebellar atrophy is seen. No midline shift is seen. There is no evidence of fracture; visualized osseous structures are unremarkable in appearance. The orbits are within normal limits. The paranasal sinuses and mastoid air cells are well-aerated. No significant soft tissue abnormalities are seen. IMPRESSION: 1. Scattered areas of hypoattenuation reflect the patient's known diffuse brain metastases. These are better characterized on recent prior MRI. 2. Scattered small vessel ischemic microangiopathy and mild cortical volume loss. Electronically Signed   By: Garald Balding M.D.   On: 01/12/2016 21:52   Ct Soft Tissue Neck W Contrast  12/15/2015  CLINICAL DATA:  Left metastatic breast cancer diagnosed in 2011. Ongoing chemotherapy. Difficulty swallowing for 2 weeks. EXAM: CT NECK WITH CONTRAST TECHNIQUE: Multidetector CT imaging of the neck was performed using the standard protocol following the bolus administration of intravenous contrast. CONTRAST:  7m OMNIPAQUE IOHEXOL 300 MG/ML SOLN, 1059mOMNIPAQUE IOHEXOL 300 MG/ML SOLN COMPARISON:  PET-CT 02/28/2015 FINDINGS: Pharynx and larynx: Asymmetric larynx. The left arytenoid is medialized. No evidence of pharyngeal based mass lesion. Salivary glands: Negative Thyroid: Numerous small bilateral nodules, considered incidental. Lymph nodes: Enlarged nodes at diagnosis have significantly decreased in size. No enlarged necrotic appearing lymph nodes in the neck. No mass displacing the cervical esophagus. Vascular: Atherosclerosis without evidence of flow limiting stenosis. Limited intracranial: 6 mm focus of enhancement along the floor of the fourth ventricle, left of midline. No associated edema or spur thick ventricular effacement. Visualized orbits: Negative Mastoids and visualized paranasal sinuses: Clear Skeleton: No acute finding or aggressive  lesion. Multilevel advanced facet arthropathy Upper chest: Reported separately These results will be called to the ordering clinician or representative by the Radiologist Assistant, and communication documented in the PACS or zVision Dashboard. IMPRESSION: 1. Questionable 6 mm mass in the left pons. Recommend brain MRI to evaluate for metastatic disease. 2. Asymmetric larynx, possible cord paresis on the left. 3. Supraclavicular/low cervical  metastatic adenopathy seen in 2016 has improved. Currently no enlarged cervical lymph nodes. 4. Chest reported separately. Electronically Signed   By: Monte Fantasia M.D.   On: 12/15/2015 10:28   Ct Chest W Contrast  12/15/2015  CLINICAL DATA:  70 year old female with history of left-sided breast cancer diagnosed in 2011 with none metastatic disease. Ongoing chemotherapy. Difficulty swallowing for the past 2 weeks. EXAM: CT CHEST, ABDOMEN, AND PELVIS WITH CONTRAST TECHNIQUE: Multidetector CT imaging of the chest, abdomen and pelvis was performed following the standard protocol during bolus administration of intravenous contrast. CONTRAST:  34m OMNIPAQUE IOHEXOL 300 MG/ML SOLN, 1084mOMNIPAQUE IOHEXOL 300 MG/ML SOLN COMPARISON:  CT the chest, abdomen and pelvis 09/22/2015. PET-CT 07/18/2015. FINDINGS: CT CHEST FINDINGS Mediastinum/Lymph Nodes: Further increase in metastatic lymphadenopathy throughout the mediastinum, most evident in the anterior mediastinum where the largest enhancing nodal mass measures up to 3.5 x 6.1 cm (image 18 of series 2), previously only 2.7 x 4.6 cm on 09/22/2015. This lesion is contiguous with the left internal mammary nodal chain, which also demonstrates some enlarged lymph nodes. Marked enlargement of a low right paratracheal lymph node which currently measures 1.7 cm in short axis (previously nonenlarged). Heart size is borderline enlarged. There is no significant pericardial fluid, thickening or pericardial calcification. There is  atherosclerosis of the thoracic aorta, the great vessels of the mediastinum and the coronary arteries, including calcified atherosclerotic plaque in the left main, left anterior descending and right coronary arteries. Mild calcifications of the mitral annulus and aortic valve. Esophagus is unremarkable in appearance. No axillary lymphadenopathy. Right-sided internal jugular single-lumen porta cath with tip terminating at the superior cavoatrial junction. Lungs/Pleura: No suspicious appearing pulmonary nodules or masses. No acute consolidative airspace disease. No pleural effusions. Musculoskeletal/Soft Tissues: Soft tissue mass in the central aspect of the left breast measuring up to 2.9 x 2.2 cm (image 35 of series 2), similar to prior examination. Some overlying skin thickening is noted. Irregular sclerosis and lucency is again noted in the manubrium, similar to the prior examination. There is also a small lucent lesion in the left side of the sternum measuring 7 mm (image 23 of series 7) which is similar to prior examinations. CT ABDOMEN AND PELVIS FINDINGS Hepatobiliary: No suspicious cystic or solid hepatic lesions. No intra or extrahepatic biliary ductal dilatation. Gallbladder is normal in appearance. Pancreas: No pancreatic mass. No pancreatic ductal dilatation. No pancreatic or peripancreatic fluid or inflammatory changes. Spleen: Unremarkable. Adrenals/Urinary Tract: Several sub cm low-attenuation lesions are noted in the kidneys bilaterally, similar to prior examinations. Although these are too small to definitively characterize, these are presumably small cysts. Bilateral adrenal glands are normal in appearance. No hydroureteronephrosis. Urinary bladder is nearly decompressed, but otherwise unremarkable in appearance. Stomach/Bowel: Normal appearance of the stomach. No pathologic dilatation of small bowel or colon. Normal appendix. Vascular/Lymphatic: Atherosclerosis throughout the abdominal and pelvic  vasculature, without evidence of aneurysm or dissection. No lymphadenopathy noted in the abdomen or pelvis. Reproductive: Uterus and ovaries are atrophic. Other: No significant volume of ascites.  No pneumoperitoneum. Musculoskeletal: New osteolysis involving the superior aspect of the right ilium with some extension of tumor into the overlying right gluteal and iliacus musculature. 12 mm of anterolisthesis of L5 upon S1. Bilateral pars defects at L5. IMPRESSION: 1. Today's study demonstrates progression of metastatic disease, with interval increase in mediastinal lymphadenopathy, and new development of a large lytic lesion in the superior aspect of the right ilium. 2. 2.9 x 2.2 cm mass in the  central aspect of the left breast is similar to prior examinations. 3. Irregularities in the manubrium and sternum, similar to prior examinations, potentially either treatment related or treated metastatic disease. 4. Additional incidental findings, similar to prior examinations, as above. Electronically Signed   By: Vinnie Langton M.D.   On: 12/15/2015 10:37   Ct Angio Chest Pe W/cm &/or Wo Cm  12/24/2015  CLINICAL DATA:  Difficulty breathing the past 3 days. Coughing with sputum. Admitted yesterday to hospice. Increased weakness with shaking. EXAM: CT ANGIOGRAPHY CHEST WITH CONTRAST TECHNIQUE: Multidetector CT imaging of the chest was performed using the standard protocol during bolus administration of intravenous contrast. Multiplanar CT image reconstructions and MIPs were obtained to evaluate the vascular anatomy. CONTRAST:  177m OMNIPAQUE IOHEXOL 350 MG/ML SOLN COMPARISON:  Chest x-ray from earlier same day and chest CT dated 12/15/2015. FINDINGS: Anterior mediastinal mass measuring 5.6 x 3.4 cm, stable compared to the most recent chest CT. By report, this measured 4.6 x 2.7 cm on 09/22/2015. Lower right paratracheal lymph node is stable measuring 1.9 cm short axis dimension. Additional nodular mass within the aorta  pulmonary window region is stable at 1.4 cm short axis dimension. No new masses or enlarged lymph nodes within the mediastinum or perihilar regions. The majority of the peripheral segmental and subsegmental pulmonary arteries cannot be definitively characterized due to patient breathing motion artifact. There is no central obstructing pulmonary embolism identified within the main, lobar or central segmental pulmonary arteries. Thoracic aorta is normal in caliber and configuration. No aortic aneurysm or dissection. Heart size is upper normal. No pericardial effusion. Lungs are clear. No evidence of pneumonia. No pleural effusion. No pneumothorax. Trachea and central bronchi are unremarkable. Limited images of the upper abdomen are unremarkable. Left breast mass is stable in appearance, measuring approximately 2.8 cm greatest dimension, compatible with patient's history of breast cancer and/or postsurgical change. No acute osseous abnormality appreciated. Degenerative changes again noted within the thoracic spine. Irregular sclerosis and lucency within the sternum is stable and may be related to post treatment change. Review of the MIP images confirms the above findings. IMPRESSION: 1. Stable exam. The mediastinal masses and lymphadenopathy are stable compared to the most recent chest CT of 12/15/2015, although progression was noted in the last CT report when compared to an earlier CT of 09/22/2015. 2. No acute findings. No pulmonary embolism seen, with study limitations detailed above. Lungs are clear. Electronically Signed   By: SFranki CabotM.D.   On: 01/19/2016 22:00   Mr BJeri CosWJIContrast  12/24/2015  CLINICAL DATA:  Altered mental status. Noncommunicating. History of breast cancer, assess for metastasis. EXAM: MRI HEAD WITHOUT AND WITH CONTRAST TECHNIQUE: Multiplanar, multiecho pulse sequences of the brain and surrounding structures were obtained without and with intravenous contrast. CONTRAST:  161m MULTIHANCE GADOBENATE DIMEGLUMINE 529 MG/ML IV SOLN COMPARISON:  CT neck December 23, 2015 FINDINGS: At least 20 infratentorial and greater than 20 supratentorial enhancing parenchymal masses, including 12 mm mass prior pontomedullary junction, multiple subcentimeter masses in the bilateral thalamus, bilateral basal ganglia and, at gray-white matter junction, extending into the deep white matter. 1 cm mass in LEFT anterior cranial fossa and may be extra-axial, sagittal 19/24. Largest supratentorial mass in LEFT frontal lobe measuring up to 18 mm. 11 mm mass RIGHT parasagittal splenium of corpus callosum. Masses demonstrate reduced diffusion, in T2 shine through. No susceptibility artifact to suggest hemorrhage. Masses demonstrate bright FLAIR T2 hyperintense vasogenic edema without significant mass effect. No  midline shift. No reduced diffusion to suggest acute ischemia. Ventricles and sulci are generally normal for patient's age. No abnormal extra-axial fluid collections. No extra-axial masses nor leptomeningeal enhancement. Normal major intracranial vascular flow voids seen at the skull base. Ocular globes and orbital contents are unremarkable though not tailored for evaluation. No suspicious calvarial bone marrow signal. No abnormal sellar expansion. Craniocervical junction maintained. Visualized paranasal sinuses and mastoid air cells are well-aerated. IMPRESSION: At least 20 infratentorial and greater than 20 relatively small supratentorial intraparenchymal metastasis. Local mass effect without midline shift. 1 cm LEFT anterior cranial fossa possible dural based metastasis. Electronically Signed   By: Elon Alas M.D.   On: 12/24/2015 22:27   Ct Abdomen Pelvis W Contrast  12/15/2015  CLINICAL DATA:  70 year old female with history of left-sided breast cancer diagnosed in 2011 with none metastatic disease. Ongoing chemotherapy. Difficulty swallowing for the past 2 weeks. EXAM: CT CHEST, ABDOMEN, AND  PELVIS WITH CONTRAST TECHNIQUE: Multidetector CT imaging of the chest, abdomen and pelvis was performed following the standard protocol during bolus administration of intravenous contrast. CONTRAST:  58m OMNIPAQUE IOHEXOL 300 MG/ML SOLN, 1061mOMNIPAQUE IOHEXOL 300 MG/ML SOLN COMPARISON:  CT the chest, abdomen and pelvis 09/22/2015. PET-CT 07/18/2015. FINDINGS: CT CHEST FINDINGS Mediastinum/Lymph Nodes: Further increase in metastatic lymphadenopathy throughout the mediastinum, most evident in the anterior mediastinum where the largest enhancing nodal mass measures up to 3.5 x 6.1 cm (image 18 of series 2), previously only 2.7 x 4.6 cm on 09/22/2015. This lesion is contiguous with the left internal mammary nodal chain, which also demonstrates some enlarged lymph nodes. Marked enlargement of a low right paratracheal lymph node which currently measures 1.7 cm in short axis (previously nonenlarged). Heart size is borderline enlarged. There is no significant pericardial fluid, thickening or pericardial calcification. There is atherosclerosis of the thoracic aorta, the great vessels of the mediastinum and the coronary arteries, including calcified atherosclerotic plaque in the left main, left anterior descending and right coronary arteries. Mild calcifications of the mitral annulus and aortic valve. Esophagus is unremarkable in appearance. No axillary lymphadenopathy. Right-sided internal jugular single-lumen porta cath with tip terminating at the superior cavoatrial junction. Lungs/Pleura: No suspicious appearing pulmonary nodules or masses. No acute consolidative airspace disease. No pleural effusions. Musculoskeletal/Soft Tissues: Soft tissue mass in the central aspect of the left breast measuring up to 2.9 x 2.2 cm (image 35 of series 2), similar to prior examination. Some overlying skin thickening is noted. Irregular sclerosis and lucency is again noted in the manubrium, similar to the prior examination. There is  also a small lucent lesion in the left side of the sternum measuring 7 mm (image 23 of series 7) which is similar to prior examinations. CT ABDOMEN AND PELVIS FINDINGS Hepatobiliary: No suspicious cystic or solid hepatic lesions. No intra or extrahepatic biliary ductal dilatation. Gallbladder is normal in appearance. Pancreas: No pancreatic mass. No pancreatic ductal dilatation. No pancreatic or peripancreatic fluid or inflammatory changes. Spleen: Unremarkable. Adrenals/Urinary Tract: Several sub cm low-attenuation lesions are noted in the kidneys bilaterally, similar to prior examinations. Although these are too small to definitively characterize, these are presumably small cysts. Bilateral adrenal glands are normal in appearance. No hydroureteronephrosis. Urinary bladder is nearly decompressed, but otherwise unremarkable in appearance. Stomach/Bowel: Normal appearance of the stomach. No pathologic dilatation of small bowel or colon. Normal appendix. Vascular/Lymphatic: Atherosclerosis throughout the abdominal and pelvic vasculature, without evidence of aneurysm or dissection. No lymphadenopathy noted in the abdomen or pelvis. Reproductive: Uterus and  ovaries are atrophic. Other: No significant volume of ascites.  No pneumoperitoneum. Musculoskeletal: New osteolysis involving the superior aspect of the right ilium with some extension of tumor into the overlying right gluteal and iliacus musculature. 12 mm of anterolisthesis of L5 upon S1. Bilateral pars defects at L5. IMPRESSION: 1. Today's study demonstrates progression of metastatic disease, with interval increase in mediastinal lymphadenopathy, and new development of a large lytic lesion in the superior aspect of the right ilium. 2. 2.9 x 2.2 cm mass in the central aspect of the left breast is similar to prior examinations. 3. Irregularities in the manubrium and sternum, similar to prior examinations, potentially either treatment related or treated metastatic  disease. 4. Additional incidental findings, similar to prior examinations, as above. Electronically Signed   By: Vinnie Langton M.D.   On: 12/15/2015 10:37   Dg Esophagus  12/15/2015  CLINICAL DATA:  Dysphagia with solids. History of breast cancer status post lumpectomy and radiation. EXAM: ESOPHOGRAM / BARIUM SWALLOW / BARIUM TABLET STUDY TECHNIQUE: Combined double contrast and single contrast examination performed using effervescent crystals, thick barium liquid, and thin barium liquid. The patient was observed with fluoroscopy swallowing a 13 mm barium sulphate tablet. FLUOROSCOPY TIME:  Radiation Exposure Index (as provided by the fluoroscopic device): 24.62 d Gy cm2 COMPARISON:  None. FINDINGS: Normal esophageal peristalsis. No fixed esophageal narrowing or stricture. A 13 mm barium tablet passed into the stomach without delay. Small hiatal hernia. Patient could not tolerate prone swallows. Gastroesophageal reflux could not be assessed. IMPRESSION: No fixed esophageal narrowing or stricture. Small hiatal hernia. Electronically Signed   By: Julian Hy M.D.   On: 12/15/2015 12:33   Dg Chest Port 1 View  01/09/2016  CLINICAL DATA:  Respiratory distress, intubated EXAM: PORTABLE CHEST 1 VIEW COMPARISON:  01/08/2016 FINDINGS: Portable exam, rotated to the right. This distorts the mediastinum. Endotracheal tube is 5 cm above the carina. NG tube within the stomach. Stable right IJ power port catheter tip mid SVC. Minor basilar atelectasis on the left. Right lung remains clear. No developing pneumonia, collapse or consolidation. No enlarging effusion or significant pneumothorax. Atherosclerosis noted of the aorta. IMPRESSION: Overall stable portable chest exam with persistent left basilar atelectasis. Electronically Signed   By: Jerilynn Mages.  Shick M.D.   On: 01/09/2016 07:43   Dg Chest Port 1 View  01/08/2016  CLINICAL DATA:  STAT READ. CALL REPORT TO [id number]. Status post ETT placement. Pt was re intubated this  AM by anesthesia. Hx of respiratory distress. Hx of left breast cancer with brain mets. EXAM: PORTABLE CHEST 1 VIEW COMPARISON:  01/08/2016 FINDINGS: Endotracheal tube has been repositioned, tip now approximately 4.7 cm above the carina. Nasogastric tube is in place, tip coiled back upon itself in the region of the gastric fundus. A right-sided Port-A-Cath tip overlies the level of superior vena cava. Heart size is normal. Better lung inflation. There is persistent left perihilar density consistent with adenopathy. There is minimal left lower lobe atelectasis. Interval decompression of the stomach. IMPRESSION: 1. Repositioned endotracheal tube. 2. Decompression of the stomach. 3. Left lower lobe atelectasis. 4. Mediastinal adenopathy. Electronically Signed   By: Nolon Nations M.D.   On: 01/08/2016 11:42   Dg Chest Port 1 View  12/25/2015  CLINICAL DATA:  Increased weakness, worsened breathing, increased sputum. History of left breast cancer with brain metastases. EXAM: PORTABLE CHEST 1 VIEW COMPARISON:  Chest x-ray dated 02/21/2015. Comparison also made to chest CT of 12/15/2015. FINDINGS: Cardiomediastinal silhouette appears stable in  size and configuration. Some fullness at the left hilum is likely related to the metastatic lymphadenopathy identified within the mediastinum on earlier chest CT. Right chest wall Port-A-Cath now in place with tip well-positioned at the level of the lower SVC. Lungs are clear. No pleural effusion. No pneumothorax. Osseous structures about the chest are unremarkable. IMPRESSION: Fullness in the left hilum likely related to the metastatic lymphadenopathy identified within the mediastinum on earlier chest CT. Lungs are clear and there is no evidence of acute cardiopulmonary abnormality. No evidence of pneumonia. Electronically Signed   By: Franki Cabot M.D.   On: 12/25/2015 18:45   Dg Abd Portable 1v  01/08/2016  CLINICAL DATA:  Check gastric catheter placement EXAM: PORTABLE  ABDOMEN - 1 VIEW COMPARISON:  None. FINDINGS: A gastric catheter is now seen coiled within the stomach. Scattered large and small bowel gas is noted. Changes are noted at L1, L2 and L3 E consistent with degenerative change although some suggestion of vertebral body height loss is noted. This may be projectional in nature. The need for further evaluation can be determined on a clinical basis. IMPRESSION: Gastric catheter within the stomach. Degenerative changes of the lumbar spine and suggestion of vertebral body height loss as described. Electronically Signed   By: Inez Catalina M.D.   On: 01/08/2016 08:54    Microbiology: Recent Results (from the past 240 hour(s))  Blood Culture (routine x 2)     Status: None (Preliminary result)   Collection Time: 01/16/2016  6:00 PM  Result Value Ref Range Status   Specimen Description BLOOD RIGHT ARM  Final   Special Requests BOTTLES DRAWN AEROBIC AND ANAEROBIC 5CC  Final   Culture   Final    NO GROWTH 4 DAYS Performed at [facility]    Report Status PENDING  Incomplete  Blood Culture (routine x 2)     Status: None (Preliminary result)   Collection Time: 01/23/2016  6:00 PM  Result Value Ref Range Status   Specimen Description BLOOD LEFT ARM  Final   Special Requests BOTTLES DRAWN AEROBIC AND ANAEROBIC 5CC  Final   Culture   Final    NO GROWTH 4 DAYS Performed at [facility]    Report Status PENDING  Incomplete  Urine culture     Status: None   Collection Time: 01/02/2016 10:38 PM  Result Value Ref Range Status   Specimen Description URINE, CLEAN CATCH  Final   Special Requests NONE  Final   Culture   Final    MULTIPLE SPECIES PRESENT, SUGGEST RECOLLECTION Performed at [facility]    Report Status 01/07/2016 FINAL  Final  MRSA PCR Screening     Status: None   Collection Time: 01/06/16  2:38 AM  Result Value Ref Range Status   MRSA by PCR NEGATIVE NEGATIVE Final    Comment:        The GeneXpert MRSA Assay (FDA approved for  NASAL specimens only), is one component of a comprehensive MRSA colonization surveillance program. It is not intended to diagnose MRSA infection nor to guide or monitor treatment for MRSA infections.   Culture, respiratory (NON-Expectorated)     Status: None (Preliminary result)   Collection Time: 01/08/16 11:33 AM  Result Value Ref Range Status   Specimen Description TRACHEAL ASPIRATE  Final   Special Requests Immunocompromised  Final   Gram Stain   Final    FEW WBC PRESENT, PREDOMINANTLY PMN FEW SQUAMOUS EPITHELIAL CELLS PRESENT MODERATE YEAST Performed at  [facility]    Culture   Final    ABUNDANT YEAST CONSISTENT WITH CANDIDA SPECIES Performed at [facility]    Report Status PENDING  Incomplete     Labs: Basic Metabolic Panel:  Recent Labs Lab 12/26/2015 1810 01/06/16 0228 01/07/16 0500 01/08/16 0500 01/09/16 0636  NA 135 138 139 138 141  K 4.2 3.9 4.1 4.3 4.0  CL 103 104 107 109 109  CO2 24 25 23 24 23   GLUCOSE 176* 135* 147* 108* 176*  BUN 24* 18 21* 19 20  CREATININE 0.44 0.37* 0.37* 0.37* 0.33*  CALCIUM 8.0* 7.7* 7.1* 6.8* 6.7*   Liver Function Tests:  Recent Labs Lab 12/24/2015 1810 01/06/16 0228 01/09/16 0636  AST 19 17 41  ALT 20 19 45  ALKPHOS 88 80 57  BILITOT 0.8 0.8 0.6  PROT 7.4 6.8 5.7*  ALBUMIN 3.8 3.6 2.8*   No results for input(s): LIPASE, AMYLASE in the last 168 hours.  Recent Labs Lab 01/04/2016 2159  AMMONIA 58*   CBC:  Recent Labs Lab 01/07/2016 1810 01/06/16 0228 01/07/16 0500 01/08/16 0500 01/09/16 0636  WBC 2.5* 1.7* 1.8* 2.5* 2.8*  NEUTROABS 2.2 1.2*  --   --   --   HGB 12.3 11.8* 11.0* 11.4* 9.8*  HCT 35.6* 35.8* 32.3* 35.5*  34.7 29.8*  MCV 82.6 86.7 83.2 88.1 88.4  PLT 129* 112* 112* 110* 85*   Cardiac Enzymes: No results for input(s): CKTOTAL, CKMB, CKMBINDEX, TROPONINI in the last 168 hours. BNP: Invalid input(s): POCBNP CBG:  Recent Labs Lab 01/09/16 1648 01/09/16 2024  Feb 09, 2016 0049 February 09, 2016 0424 02-09-16 0744  GLUCAP 157* 125* 128* 111* 84    Time of Death: 1216/01/20  Signed:  Orson Eva, DO [facility] [phone] 09-Feb-2016, 2:54 PM

## 2016-01-24 NOTE — Progress Notes (Signed)
I received a page from patient's bedside nurse that she was having difficulty breathing but was not yet due for another dose of morphine. She has talked the family and they report being okay with continuous morphine infusion.   I spoke with one of her sons on the phone and relayed plan to increase frequency of as needed dosing to every hour.  We also discussed starting morphine continuous infusion. She has used approximately 12 mg last 12 hours so will start a rate of 1 mg per hour. She will likely require frequent rescue boluses for at least the next 6-8 hours until it reaches steady state.  I informed them and I would stop by to speak with them in person and check on her later this afternoon.  I discussed plan with her bedside nurse and asked her to let me know if she stills appears uncomfortable despite these interventions. I will plan on evaluating her in person as soon as I'm able to reach the hospital.  Micheline Rough, MD [facility] [phone]

## 2016-01-24 NOTE — Progress Notes (Signed)
Michele Hale 1235 EXPIRED 1217PM TODAY 01-24-16. PRONOUNCED BY [facility] AND [facility]. NO ORGAN DONATION. [name]. FAMILY HERE. MD NOTIFIED. RECEIVED 1MG  MS DRIP- WASTED BY [facility] AND [facility] THE REST.

## 2016-01-24 DEATH — deceased

## 2016-02-10 ENCOUNTER — Encounter (HOSPITAL_COMMUNITY): Admission: RE | Payer: Self-pay | Source: Ambulatory Visit

## 2016-02-10 ENCOUNTER — Ambulatory Visit (HOSPITAL_COMMUNITY): Admission: RE | Admit: 2016-02-10 | Payer: Medicaid Other | Source: Ambulatory Visit | Admitting: Gastroenterology

## 2016-02-10 SURGERY — ESOPHAGOGASTRODUODENOSCOPY (EGD) WITH PROPOFOL
Anesthesia: Monitor Anesthesia Care

## 2016-02-18 ENCOUNTER — Ambulatory Visit: Payer: Medicaid Other

## 2016-03-09 IMAGING — RF DG ESOPHAGUS
9 of 10 series · 12 of 13 positions shown · non-contrast
Comparison: None.

CLINICAL DATA: Dysphagia with solids. History of breast cancer
status post lumpectomy and radiation.

EXAM:
ESOPHOGRAM / BARIUM SWALLOW / BARIUM TABLET STUDY
TECHNIQUE: Combined double contrast and single contrast examination performed
using effervescent crystals, thick barium liquid, and thin barium
liquid. The patient was observed with fluoroscopy swallowing a 13 mm
barium sulphate tablet.
FLUOROSCOPY TIME:  Radiation Exposure Index (as provided by the
fluoroscopic device): 24.62 d Gy cm2

[Series 1: fluoro_barium 2fps_bw · 0.17mm/px · 1 of 1 slices shown (1 of 8)]
[im 1/1]
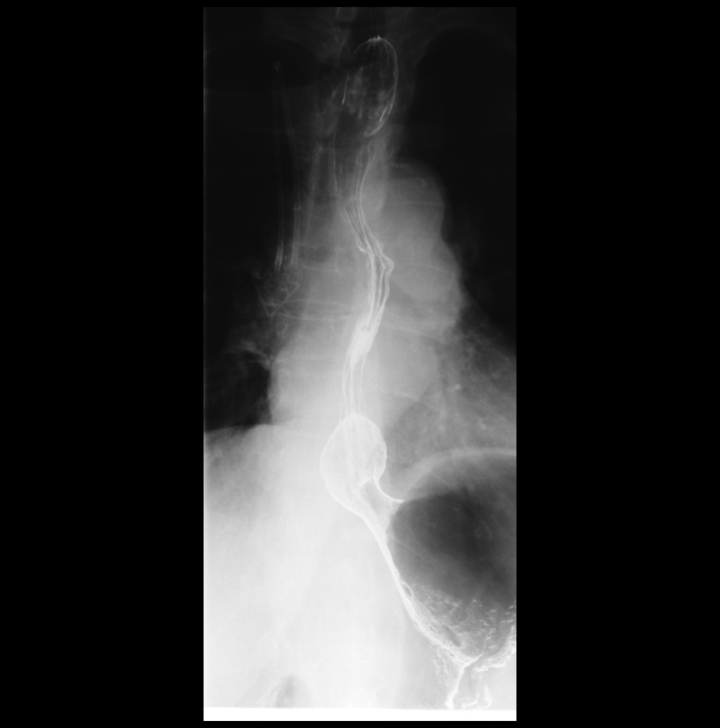

[Series 2: fluoro_barium 2fps_bw · 0.17mm/px · 2 of 2 frames shown (2 of 8)]
[frame 1/2]
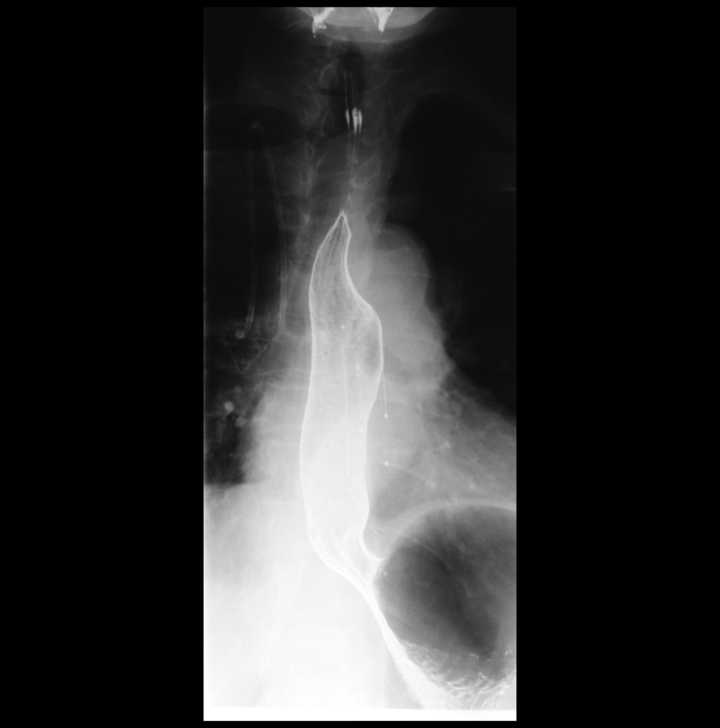
[frame 2/2]
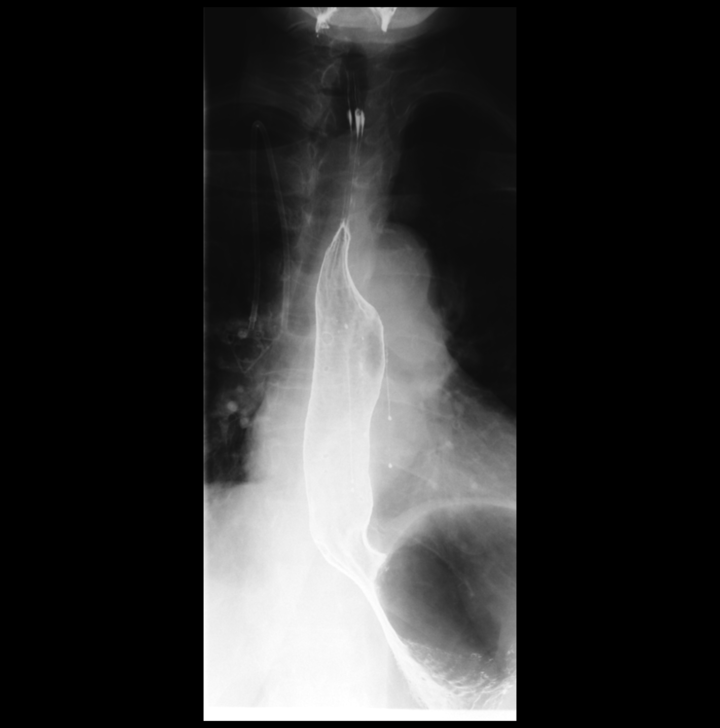

[Series 3: fluoro_barium 2fps_bw · 0.17mm/px · 1 of 1 slices shown (3 of 8)]
[im 1/1]
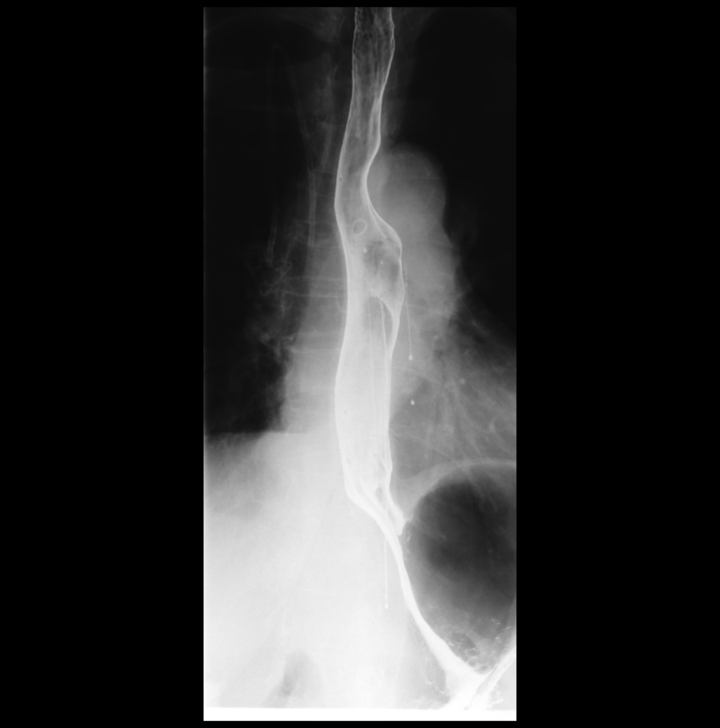

[Series 4: fluoro_barium 2fps_bw · 0.17mm/px · 1 of 1 slices shown (4 of 8)]
[im 1/1]
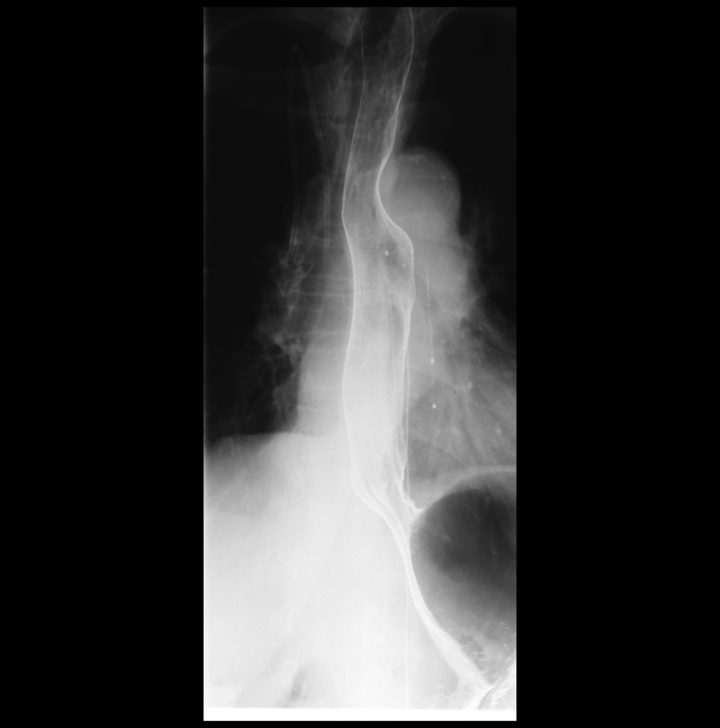

[Series 5: fluoro_barium 2fps_bw · 0.17mm/px · 1 of 1 slices shown (5 of 8)]
[im 1/1]
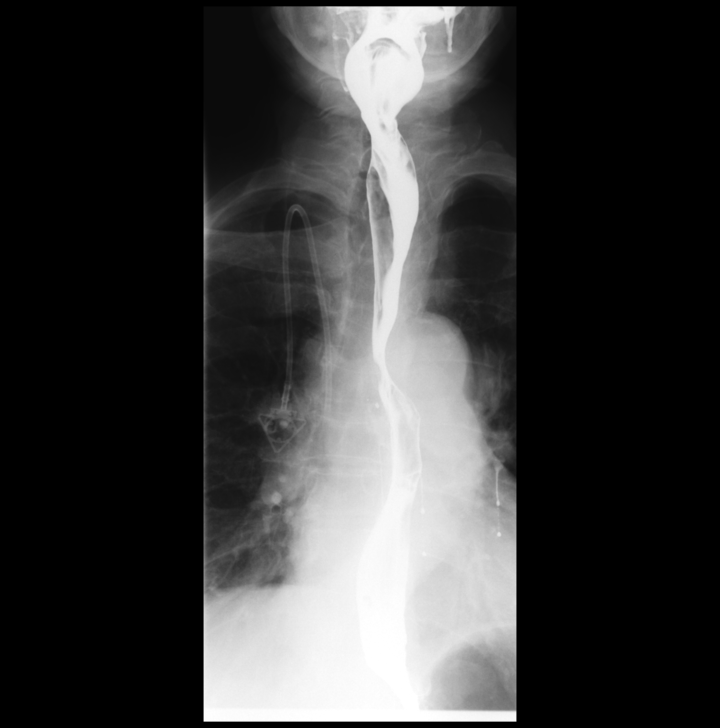

[Series 7: fluoro_barium 2fps_bw · 0.18mm/px · 1 of 1 slices shown (6 of 8)]
[im 1/1]
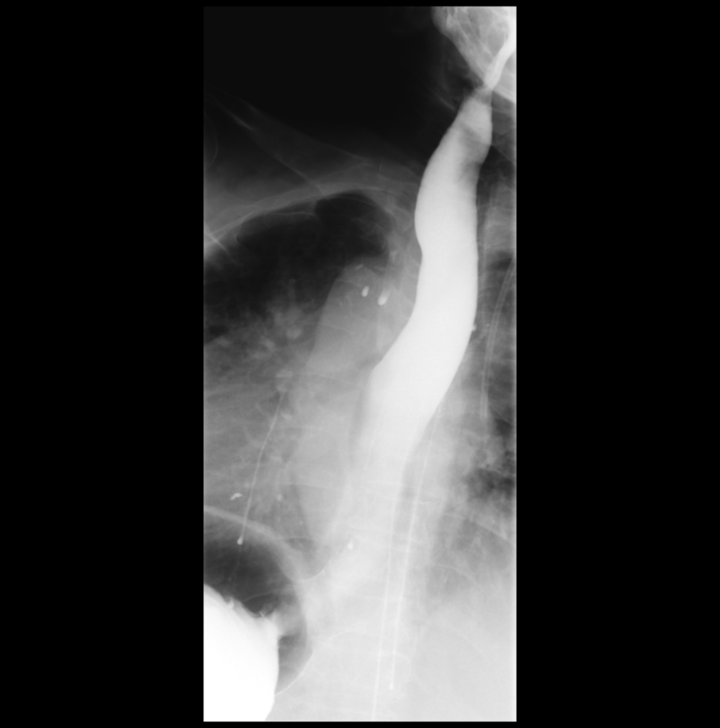

[Series 8: cp_standard · 0.53mm/px · 3 of 10 frames shown]
[frame 2/10]
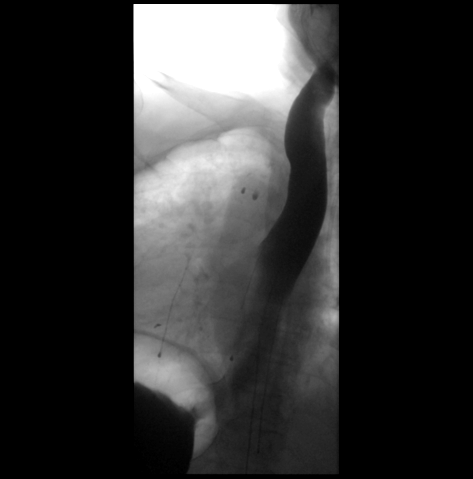
[frame 6/10]
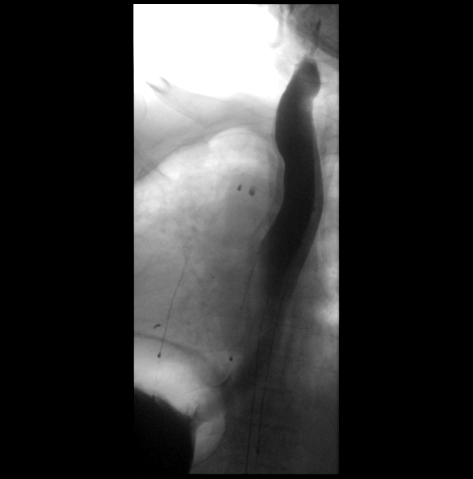
[frame 9/10]
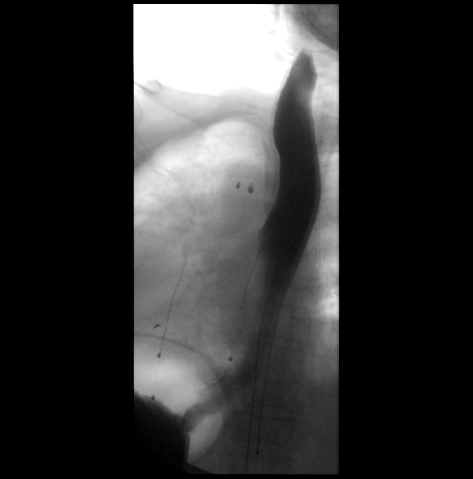

[Series 9: fluoro_barium 2fps_bw · 0.18mm/px · 1 of 1 slices shown (7 of 8)]
[im 1/1]
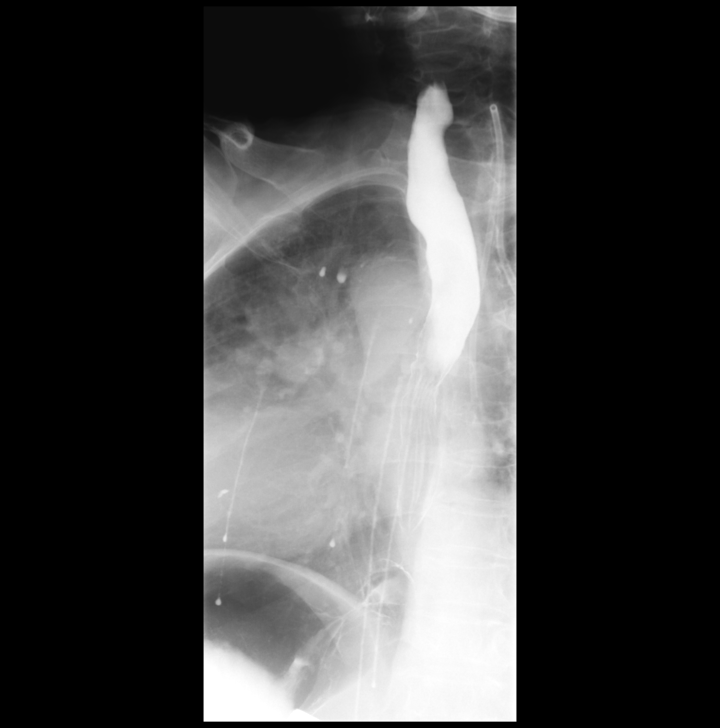

[Series 10: fluoro_barium 2fps_bw · 0.18mm/px · 1 of 1 slices shown (8 of 8)]
[im 1/1]
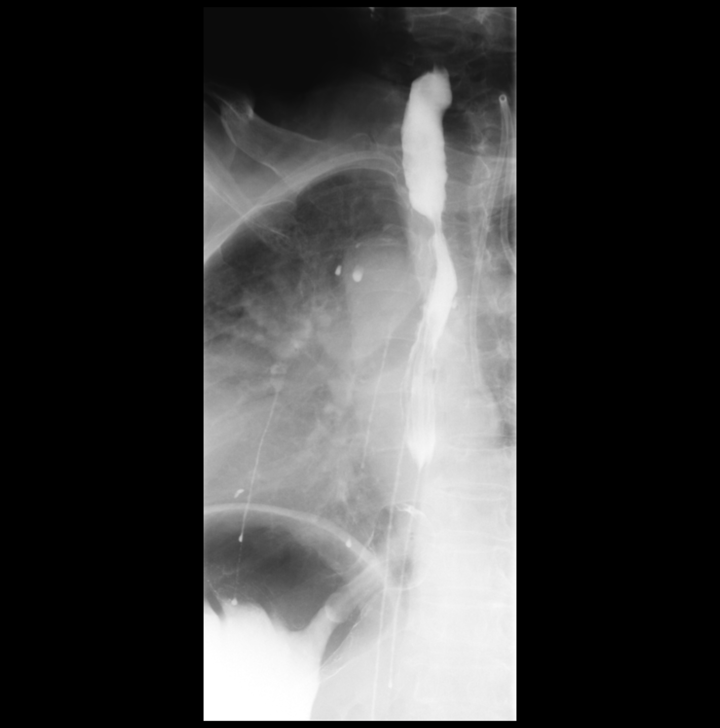

[12 of 13 positions shown; findings below may reference images not displayed]

FINDINGS: Normal esophageal peristalsis.

No fixed esophageal narrowing or stricture. A 13 mm barium tablet
passed into the stomach without delay.

Small hiatal hernia.

Patient could not tolerate prone swallows. Gastroesophageal reflux
could not be assessed.
IMPRESSION: No fixed esophageal narrowing or stricture.

Small hiatal hernia.

## 2016-03-17 ENCOUNTER — Ambulatory Visit: Payer: Medicaid Other

## 2016-04-14 ENCOUNTER — Other Ambulatory Visit: Payer: Self-pay | Admitting: Nurse Practitioner
